# Patient Record
Sex: Female | Born: 1950 | Race: White | Hispanic: No | Marital: Married | Smoking: Never smoker
Health system: Southern US, Community
[De-identification: ages and names within clinical notes are randomized; demographics above are authoritative.]

## PROBLEM LIST (undated history)

## (undated) DIAGNOSIS — C801 Malignant (primary) neoplasm, unspecified: Secondary | ICD-10-CM

## (undated) DIAGNOSIS — L570 Actinic keratosis: Secondary | ICD-10-CM

## (undated) DIAGNOSIS — G709 Myoneural disorder, unspecified: Secondary | ICD-10-CM

## (undated) DIAGNOSIS — K219 Gastro-esophageal reflux disease without esophagitis: Secondary | ICD-10-CM

## (undated) DIAGNOSIS — H269 Unspecified cataract: Secondary | ICD-10-CM

## (undated) DIAGNOSIS — G4730 Sleep apnea, unspecified: Secondary | ICD-10-CM

## (undated) DIAGNOSIS — C439 Malignant melanoma of skin, unspecified: Secondary | ICD-10-CM

## (undated) DIAGNOSIS — M1990 Unspecified osteoarthritis, unspecified site: Secondary | ICD-10-CM

## (undated) DIAGNOSIS — E039 Hypothyroidism, unspecified: Secondary | ICD-10-CM

## (undated) HISTORY — PX: EYE SURGERY: SHX253

## (undated) HISTORY — DX: Actinic keratosis: L57.0

## (undated) HISTORY — DX: Unspecified osteoarthritis, unspecified site: M19.90

## (undated) HISTORY — PX: BREAST CYST ASPIRATION: SHX578

## (undated) HISTORY — PX: MELANOMA EXCISION: SHX5266

## (undated) HISTORY — PX: FRACTURE SURGERY: SHX138

## (undated) HISTORY — PX: FOOT SURGERY: SHX648

## (undated) HISTORY — DX: Myoneural disorder, unspecified: G70.9

## (undated) HISTORY — DX: Malignant melanoma of skin, unspecified: C43.9

## (undated) HISTORY — PX: ABDOMINAL HYSTERECTOMY: SHX81

## (undated) HISTORY — PX: BACK SURGERY: SHX140

## (undated) HISTORY — DX: Unspecified cataract: H26.9

## (undated) HISTORY — PX: SPINE SURGERY: SHX786

## (undated) HISTORY — DX: Sleep apnea, unspecified: G47.30

---

## 2007-03-24 ENCOUNTER — Encounter: Admission: RE | Admit: 2007-03-24 | Discharge: 2007-03-24 | Payer: Self-pay | Admitting: Neurosurgery

## 2007-08-16 ENCOUNTER — Emergency Department (HOSPITAL_COMMUNITY): Admission: EM | Admit: 2007-08-16 | Discharge: 2007-08-17 | Payer: Self-pay | Admitting: Emergency Medicine

## 2014-06-21 DIAGNOSIS — C439 Malignant melanoma of skin, unspecified: Secondary | ICD-10-CM

## 2014-06-21 HISTORY — DX: Malignant melanoma of skin, unspecified: C43.9

## 2015-01-01 DIAGNOSIS — E039 Hypothyroidism, unspecified: Secondary | ICD-10-CM | POA: Insufficient documentation

## 2015-01-01 DIAGNOSIS — E7849 Other hyperlipidemia: Secondary | ICD-10-CM | POA: Insufficient documentation

## 2015-01-01 DIAGNOSIS — M109 Gout, unspecified: Secondary | ICD-10-CM | POA: Insufficient documentation

## 2015-11-05 DIAGNOSIS — F419 Anxiety disorder, unspecified: Secondary | ICD-10-CM | POA: Insufficient documentation

## 2015-11-05 DIAGNOSIS — F329 Major depressive disorder, single episode, unspecified: Secondary | ICD-10-CM | POA: Insufficient documentation

## 2017-07-18 DIAGNOSIS — K219 Gastro-esophageal reflux disease without esophagitis: Secondary | ICD-10-CM | POA: Insufficient documentation

## 2017-07-18 DIAGNOSIS — M19042 Primary osteoarthritis, left hand: Secondary | ICD-10-CM | POA: Insufficient documentation

## 2017-07-18 DIAGNOSIS — F5101 Primary insomnia: Secondary | ICD-10-CM | POA: Insufficient documentation

## 2018-01-18 DIAGNOSIS — G629 Polyneuropathy, unspecified: Secondary | ICD-10-CM | POA: Insufficient documentation

## 2018-08-28 DIAGNOSIS — M5417 Radiculopathy, lumbosacral region: Secondary | ICD-10-CM | POA: Insufficient documentation

## 2018-10-11 DIAGNOSIS — H8110 Benign paroxysmal vertigo, unspecified ear: Secondary | ICD-10-CM | POA: Insufficient documentation

## 2019-03-06 ENCOUNTER — Ambulatory Visit (INDEPENDENT_AMBULATORY_CARE_PROVIDER_SITE_OTHER): Payer: Medicare Other | Admitting: Family Medicine

## 2019-03-06 ENCOUNTER — Other Ambulatory Visit: Payer: Self-pay

## 2019-03-06 VITALS — BP 126/72 | HR 84 | Temp 98.4°F | Resp 16 | Ht 64.5 in | Wt 170.0 lb

## 2019-03-06 DIAGNOSIS — E039 Hypothyroidism, unspecified: Secondary | ICD-10-CM | POA: Diagnosis not present

## 2019-03-06 DIAGNOSIS — I10 Essential (primary) hypertension: Secondary | ICD-10-CM | POA: Diagnosis not present

## 2019-03-06 DIAGNOSIS — C439 Malignant melanoma of skin, unspecified: Secondary | ICD-10-CM

## 2019-03-06 DIAGNOSIS — E7849 Other hyperlipidemia: Secondary | ICD-10-CM | POA: Diagnosis not present

## 2019-03-06 DIAGNOSIS — G5793 Unspecified mononeuropathy of bilateral lower limbs: Secondary | ICD-10-CM

## 2019-03-06 DIAGNOSIS — Z23 Encounter for immunization: Secondary | ICD-10-CM

## 2019-03-06 DIAGNOSIS — M19042 Primary osteoarthritis, left hand: Secondary | ICD-10-CM

## 2019-03-06 NOTE — Progress Notes (Signed)
Patient: [name] Female    DOB: [birth year]   68 y.o.   MRN: [id number] Visit Date: 03/06/2019  Today's Provider: [name], MD   Chief Complaint  Patient presents with  . New Patient (Initial Visit)   Subjective:   HPI Patient comes in today to establish care into the practice. She and her husband just moved back in town from [place]. She has lived in [place] most of her life. Her husband has lymphoma being treated at [facility].   Hypertension, follow-up:  BP Readings from Last 3 Encounters:  03/06/19 126/72   She reports good compliance with treatment. She is not having side effects.  She is exercising. She is adherent to low salt diet.   Outside blood pressures are checked occasionally. She is experiencing none.  Patient denies exertional chest pressure/discomfort, lower extremity edema and palpitations.   Cardiovascular risk factors include dyslipidemia.   Weight trend: stable Wt Readings from Last 3 Encounters:  03/06/19 170 lb (77.1 kg)    Current diet: well balanced   Lipid/Cholesterol, Follow-up:   Risk factors for vascular disease include hypertension  She reports good compliance with treatment. She is not having side effects.   Hypothyroidism, follow up: Patient is currently taking levothyroxine 80mcg daily. She reports good compliance and good symptom control.   No Known Allergies   Current Outpatient Medications:  .  allopurinol (ZYLOPRIM) 300 MG tablet, Take 1 tablet by mouth daily., Disp: , Rfl:  .  levothyroxine (SYNTHROID) 75 MCG tablet, Take 1 tablet by mouth daily., Disp: , Rfl:  .  meclizine (ANTIVERT) 25 MG tablet, Take 1 tablet by mouth 3 (three) times daily as needed., Disp: , Rfl:  .  simvastatin (ZOCOR) 40 MG tablet, Take 1 tablet by mouth daily., Disp: , Rfl:  .  triamterene-hydrochlorothiazide (DYAZIDE) 37.5-25 MG capsule, Take 1 capsule by mouth daily., Disp: , Rfl:  .  zolpidem (AMBIEN) 5 MG tablet,  Take 1 tablet by mouth at bedtime as needed., Disp: , Rfl:  .  Ascorbic Acid (VITAMIN C) 500 MG CHEW, Chew 2 tablets by mouth daily., Disp: , Rfl:  .  aspirin EC 81 MG tablet, Take 1 tablet by mouth daily., Disp: , Rfl:  .  Biotin (SUPER BIOTIN) 5 MG TABS, Take 1 tablet by mouth daily., Disp: , Rfl:  .  Cholecalciferol (VITAMIN D-1000 MAX ST) 25 MCG (1000 UT) tablet, Take 1 tablet by mouth daily., Disp: , Rfl:  .  Naproxen Sodium 220 MG CAPS, Take 1 tablet by mouth at bedtime as needed., Disp: , Rfl:  .  omeprazole (PRILOSEC) 20 MG capsule, Take 1 capsule by mouth daily., Disp: , Rfl:   Review of Systems  Constitutional: Negative.   HENT: Negative.   Eyes: Negative.   Respiratory: Negative.   Cardiovascular: Negative.   Gastrointestinal: Negative.   Endocrine: Negative.   Genitourinary: Negative.   Musculoskeletal: Positive for arthralgias and back pain.  Skin: Negative.   Allergic/Immunologic: Negative.   Neurological: Negative.   Hematological: Negative.   Psychiatric/Behavioral: Negative.     Social History   Tobacco Use  . Smoking status: Not on file  Substance Use Topics  . Alcohol use: Not on file      Objective:   BP 126/72   Pulse 84   Temp 98.4 F (36.9 C)   Resp 16   Ht 5' 4.5" (1.638 m)   Wt 170 lb (77.1 kg)   LMP  (LMP Unknown)  Comment: 1980s  SpO2 96%   BMI 28.73 kg/m  Vitals:   03/06/19 1532  BP: 126/72  Pulse: 84  Resp: 16  Temp: 98.4 F (36.9 C)  SpO2: 96%  Weight: 170 lb (77.1 kg)  Height: 5' 4.5" (1.638 m)  Body mass index is 28.73 kg/m.   Physical Exam HENT:     Head: Normocephalic and atraumatic.     Right Ear: External ear normal.     Left Ear: External ear normal.  Eyes:     General: No scleral icterus.    Conjunctiva/sclera: Conjunctivae normal.  Cardiovascular:     Rate and Rhythm: Normal rate and regular rhythm.     Pulses: Normal pulses.     Heart sounds: Normal heart sounds.  Pulmonary:     Effort: Pulmonary effort is  normal.     Breath sounds: Normal breath sounds.  Abdominal:     Palpations: Abdomen is soft.  Musculoskeletal:        General: Swelling present.     Comments: Swelling of PIPs and DIPs of both hands.  Lymphadenopathy:     Cervical: No cervical adenopathy.  Skin:    General: Skin is warm and dry.  Neurological:     General: No focal deficit present.     Mental Status: She is alert and oriented to person, place, and time.  Psychiatric:        Mood and Affect: Mood normal.        Behavior: Behavior normal.        Thought Content: Thought content normal.        Judgment: Judgment normal.      No results found for any visits on 03/06/19.     Assessment & Plan    1. Need for immunization against influenza  - Flu vaccine HIGH DOSE PF  2. Hypothyroidism, unspecified type  - CBC w/Diff/Platelet - TSH  3. Other hyperlipidemia Zocor,. - Lipid Profile  4. Hypertension, unspecified type Controlled.on Dyazide. - CBC w/Diff/Platelet - Comp. Metabolic Panel (12)  5. Primary osteoarthritis, left hand/right   6. Melanoma of skin ([place])  - Ambulatory referral to Dermatology  7. Neuropathy of both feet/s/p lumbar laminectomy Try gabapentin.at 100mg  q hs.Pt worried about side effects.      Richard Cranford Mon, MD  [facility]

## 2019-03-06 NOTE — Patient Instructions (Signed)
1. Try Gabapentin 100 mg at night 2. Try Tumeric daily for OA of hands

## 2019-03-07 DIAGNOSIS — E039 Hypothyroidism, unspecified: Secondary | ICD-10-CM | POA: Diagnosis not present

## 2019-03-07 DIAGNOSIS — I10 Essential (primary) hypertension: Secondary | ICD-10-CM | POA: Diagnosis not present

## 2019-03-07 DIAGNOSIS — E7849 Other hyperlipidemia: Secondary | ICD-10-CM | POA: Diagnosis not present

## 2019-03-08 LAB — COMP. METABOLIC PANEL (12)
AST: 22 IU/L (ref 0–40)
Albumin/Globulin Ratio: 1.6 (ref 1.2–2.2)
Albumin: 4.2 g/dL (ref 3.8–4.8)
Alkaline Phosphatase: 95 IU/L (ref 39–117)
BUN/Creatinine Ratio: 21 (ref 12–28)
BUN: 22 mg/dL (ref 8–27)
Bilirubin Total: 0.4 mg/dL (ref 0.0–1.2)
Calcium: 9.9 mg/dL (ref 8.7–10.3)
Chloride: 104 mmol/L (ref 96–106)
Creatinine, Ser: 1.04 mg/dL — ABNORMAL HIGH (ref 0.57–1.00)
GFR calc Af Amer: 64 mL/min/{1.73_m2} (ref >59)
GFR calc non Af Amer: 56 mL/min/{1.73_m2} — ABNORMAL LOW (ref >59)
Globulin, Total: 2.6 g/dL (ref 1.5–4.5)
Glucose: 87 mg/dL (ref 65–99)
Potassium: 4.1 mmol/L (ref 3.5–5.2)
Sodium: 144 mmol/L (ref 134–144)
Total Protein: 6.8 g/dL (ref 6.0–8.5)

## 2019-03-08 LAB — CBC WITH DIFFERENTIAL/PLATELET
Basophils Absolute: 0.1 10*3/uL (ref 0.0–0.2)
Basos: 1 %
EOS (ABSOLUTE): 0.2 10*3/uL (ref 0.0–0.4)
Eos: 2 %
Hematocrit: 39.1 % (ref 34.0–46.6)
Hemoglobin: 12.9 g/dL (ref 11.1–15.9)
Immature Grans (Abs): 0.1 10*3/uL (ref 0.0–0.1)
Immature Granulocytes: 2 %
Lymphocytes Absolute: 1.5 10*3/uL (ref 0.7–3.1)
Lymphs: 20 %
MCH: 29.9 pg (ref 26.6–33.0)
MCHC: 33 g/dL (ref 31.5–35.7)
MCV: 91 fL (ref 79–97)
Monocytes Absolute: 0.5 10*3/uL (ref 0.1–0.9)
Monocytes: 7 %
Neutrophils Absolute: 4.8 10*3/uL (ref 1.4–7.0)
Neutrophils: 68 %
Platelets: 214 10*3/uL (ref 150–450)
RBC: 4.31 x10E6/uL (ref 3.77–5.28)
RDW: 13 % (ref 11.7–15.4)
WBC: 7.2 10*3/uL (ref 3.4–10.8)

## 2019-03-08 LAB — LIPID PANEL
Chol/HDL Ratio: 4.1 ratio (ref 0.0–4.4)
Cholesterol, Total: 188 mg/dL (ref 100–199)
HDL: 46 mg/dL (ref >39)
LDL Chol Calc (NIH): 102 mg/dL — ABNORMAL HIGH (ref 0–99)
Triglycerides: 235 mg/dL — ABNORMAL HIGH (ref 0–149)
VLDL Cholesterol Cal: 40 mg/dL (ref 5–40)

## 2019-03-08 LAB — TSH: TSH: 3.04 u[IU]/mL (ref 0.450–4.500)

## 2019-04-30 ENCOUNTER — Encounter: Payer: Self-pay | Admitting: Family Medicine

## 2019-04-30 DIAGNOSIS — E7849 Other hyperlipidemia: Secondary | ICD-10-CM

## 2019-04-30 DIAGNOSIS — Z1231 Encounter for screening mammogram for malignant neoplasm of breast: Secondary | ICD-10-CM

## 2019-04-30 MED ORDER — SIMVASTATIN 40 MG PO TABS: 40.0000 mg | ORAL_TABLET | Freq: Every day | ORAL | 3 refills | Status: DC

## 2019-05-10 DIAGNOSIS — Z85820 Personal history of malignant melanoma of skin: Secondary | ICD-10-CM | POA: Diagnosis not present

## 2019-05-10 DIAGNOSIS — D485 Neoplasm of uncertain behavior of skin: Secondary | ICD-10-CM | POA: Diagnosis not present

## 2019-05-10 DIAGNOSIS — Z1283 Encounter for screening for malignant neoplasm of skin: Secondary | ICD-10-CM | POA: Diagnosis not present

## 2019-05-10 DIAGNOSIS — D2362 Other benign neoplasm of skin of left upper limb, including shoulder: Secondary | ICD-10-CM | POA: Diagnosis not present

## 2019-05-10 DIAGNOSIS — C44619 Basal cell carcinoma of skin of left upper limb, including shoulder: Secondary | ICD-10-CM | POA: Diagnosis not present

## 2019-05-31 ENCOUNTER — Encounter: Payer: Self-pay | Admitting: Family Medicine

## 2019-05-31 ENCOUNTER — Other Ambulatory Visit: Payer: Self-pay | Admitting: Family Medicine

## 2019-05-31 DIAGNOSIS — Z1211 Encounter for screening for malignant neoplasm of colon: Secondary | ICD-10-CM

## 2019-05-31 DIAGNOSIS — L570 Actinic keratosis: Secondary | ICD-10-CM | POA: Diagnosis not present

## 2019-05-31 DIAGNOSIS — Z0100 Encounter for examination of eyes and vision without abnormal findings: Secondary | ICD-10-CM

## 2019-05-31 DIAGNOSIS — Z872 Personal history of diseases of the skin and subcutaneous tissue: Secondary | ICD-10-CM | POA: Diagnosis not present

## 2019-05-31 DIAGNOSIS — C44619 Basal cell carcinoma of skin of left upper limb, including shoulder: Secondary | ICD-10-CM | POA: Diagnosis not present

## 2019-05-31 DIAGNOSIS — C4491 Basal cell carcinoma of skin, unspecified: Secondary | ICD-10-CM

## 2019-05-31 DIAGNOSIS — R238 Other skin changes: Secondary | ICD-10-CM | POA: Diagnosis not present

## 2019-05-31 DIAGNOSIS — Z1231 Encounter for screening mammogram for malignant neoplasm of breast: Secondary | ICD-10-CM

## 2019-05-31 HISTORY — DX: Basal cell carcinoma of skin, unspecified: C44.91

## 2019-06-04 ENCOUNTER — Other Ambulatory Visit: Payer: Self-pay

## 2019-06-04 ENCOUNTER — Telehealth: Payer: Self-pay

## 2019-06-04 DIAGNOSIS — Z1211 Encounter for screening for malignant neoplasm of colon: Secondary | ICD-10-CM

## 2019-06-04 MED ORDER — NA SULFATE-K SULFATE-MG SULF 17.5-3.13-1.6 GM/177ML PO SOLN: 1.0000 | Freq: Once | ORAL | 0 refills | Status: AC

## 2019-06-04 NOTE — Telephone Encounter (Signed)
Gastroenterology Pre-Procedure Review  Request Date: Tues 06/12/19 Requesting Physician: Dr. Allen Norris  PATIENT REVIEW QUESTIONS: The patient responded to the following health history questions as indicated:    1. Are you having any GI issues? no 2. Do you have a personal history of Polyps? no 3. Do you have a family history of Colon Cancer or Polyps? no 4. Diabetes Mellitus? no 5. Joint replacements in the past 12 months?no 6. Major health problems in the past 3 months?no 7. Any artificial heart valves, MVP, or defibrillator?no    MEDICATIONS & ALLERGIES:    Patient reports the following regarding taking any anticoagulation/antiplatelet therapy:   Plavix, Coumadin, Eliquis, Xarelto, Lovenox, Pradaxa, Brilinta, or Effient? no Aspirin? yes (81 mg daily)  Patient confirms/reports the following medications:  Current Outpatient Medications  Medication Sig Dispense Refill  . allopurinol (ZYLOPRIM) 300 MG tablet Take 1 tablet by mouth daily.    . Ascorbic Acid (VITAMIN C) 500 MG CHEW Chew 2 tablets by mouth daily.    Marland Kitchen aspirin EC 81 MG tablet Take 1 tablet by mouth daily.    . Biotin (SUPER BIOTIN) 5 MG TABS Take 1 tablet by mouth daily.    . Cholecalciferol (VITAMIN D-1000 MAX ST) 25 MCG (1000 UT) tablet Take 1 tablet by mouth daily.    Marland Kitchen levothyroxine (SYNTHROID) 75 MCG tablet Take 1 tablet by mouth daily.    . meclizine (ANTIVERT) 25 MG tablet Take 1 tablet by mouth 3 (three) times daily as needed.    . Na Sulfate-K Sulfate-Mg Sulf 17.5-3.13-1.6 GM/177ML SOLN Take 1 kit by mouth once for 1 dose. 354 mL 0  . Naproxen Sodium 220 MG CAPS Take 1 tablet by mouth at bedtime as needed.    Marland Kitchen omeprazole (PRILOSEC) 20 MG capsule Take 1 capsule by mouth daily.    . simvastatin (ZOCOR) 40 MG tablet Take 1 tablet (40 mg total) by mouth daily. 90 tablet 3  . triamterene-hydrochlorothiazide (DYAZIDE) 37.5-25 MG capsule Take 1 capsule by mouth daily.    Marland Kitchen zolpidem (AMBIEN) 5 MG tablet Take 1 tablet by  mouth at bedtime as needed.     No current facility-administered medications for this visit.    Patient confirms/reports the following allergies:  No Known Allergies  No orders of the defined types were placed in this encounter.   AUTHORIZATION INFORMATION Primary Insurance: 1D#: Group #:  Secondary Insurance: 1D#: Group #:  SCHEDULE INFORMATION: Date: 06/12/19 Time: Location:[facility]

## 2019-06-05 ENCOUNTER — Telehealth: Payer: Self-pay

## 2019-06-05 ENCOUNTER — Other Ambulatory Visit: Payer: Self-pay

## 2019-06-05 NOTE — Telephone Encounter (Signed)
Suprep bowel prep has been changed to Golytely bowel prep.  Instructions begin at 5pm the evening before procedure drink 8 oz every 30 minutes until completed the entire contents. Pharmacy will notify patient when her rx is ready.  Thanks [employer]

## 2019-06-07 ENCOUNTER — Ambulatory Visit
Admission: RE | Admit: 2019-06-07 | Discharge: 2019-06-07 | Disposition: A | Discharge status: Home / Self Care | Payer: Medicare Other | Source: Ambulatory Visit | Attending: Family Medicine | Admitting: Family Medicine

## 2019-06-07 DIAGNOSIS — Z1231 Encounter for screening mammogram for malignant neoplasm of breast: Secondary | ICD-10-CM | POA: Diagnosis not present

## 2019-06-07 HISTORY — DX: Malignant (primary) neoplasm, unspecified: C80.1

## 2019-06-08 ENCOUNTER — Other Ambulatory Visit: Payer: Self-pay

## 2019-06-08 ENCOUNTER — Other Ambulatory Visit
Admission: RE | Admit: 2019-06-08 | Discharge: 2019-06-08 | Disposition: A | Discharge status: Home / Self Care | Payer: Medicare Other | Source: Ambulatory Visit | Attending: Gastroenterology | Admitting: Gastroenterology

## 2019-06-08 DIAGNOSIS — Z20828 Contact with and (suspected) exposure to other viral communicable diseases: Secondary | ICD-10-CM | POA: Insufficient documentation

## 2019-06-08 DIAGNOSIS — Z01812 Encounter for preprocedural laboratory examination: Secondary | ICD-10-CM | POA: Diagnosis not present

## 2019-06-08 LAB — SARS CORONAVIRUS 2 (TAT 6-24 HRS): SARS Coronavirus 2: NEGATIVE

## 2019-06-11 ENCOUNTER — Encounter: Payer: Self-pay | Admitting: Gastroenterology

## 2019-06-12 ENCOUNTER — Other Ambulatory Visit: Payer: Self-pay

## 2019-06-12 ENCOUNTER — Ambulatory Visit: Payer: Medicare Other | Admitting: Anesthesiology

## 2019-06-12 ENCOUNTER — Encounter
Admission: RE | Disposition: A | Discharge status: Home / Self Care | Payer: Self-pay | Source: Home / Self Care | Attending: Gastroenterology

## 2019-06-12 ENCOUNTER — Encounter: Payer: Self-pay | Admitting: Gastroenterology

## 2019-06-12 ENCOUNTER — Ambulatory Visit
Admission: RE | Admit: 2019-06-12 | Discharge: 2019-06-12 | Disposition: A | Discharge status: Home / Self Care | Payer: Medicare Other | Attending: Gastroenterology | Admitting: Gastroenterology

## 2019-06-12 DIAGNOSIS — Z1211 Encounter for screening for malignant neoplasm of colon: Secondary | ICD-10-CM | POA: Diagnosis not present

## 2019-06-12 DIAGNOSIS — Z7982 Long term (current) use of aspirin: Secondary | ICD-10-CM | POA: Insufficient documentation

## 2019-06-12 DIAGNOSIS — F419 Anxiety disorder, unspecified: Secondary | ICD-10-CM | POA: Diagnosis not present

## 2019-06-12 DIAGNOSIS — D123 Benign neoplasm of transverse colon: Secondary | ICD-10-CM | POA: Diagnosis not present

## 2019-06-12 DIAGNOSIS — F329 Major depressive disorder, single episode, unspecified: Secondary | ICD-10-CM | POA: Diagnosis not present

## 2019-06-12 DIAGNOSIS — K219 Gastro-esophageal reflux disease without esophagitis: Secondary | ICD-10-CM | POA: Diagnosis not present

## 2019-06-12 DIAGNOSIS — K5730 Diverticulosis of large intestine without perforation or abscess without bleeding: Secondary | ICD-10-CM | POA: Insufficient documentation

## 2019-06-12 DIAGNOSIS — Z85820 Personal history of malignant melanoma of skin: Secondary | ICD-10-CM | POA: Diagnosis not present

## 2019-06-12 DIAGNOSIS — M5417 Radiculopathy, lumbosacral region: Secondary | ICD-10-CM | POA: Diagnosis not present

## 2019-06-12 DIAGNOSIS — Z803 Family history of malignant neoplasm of breast: Secondary | ICD-10-CM | POA: Diagnosis not present

## 2019-06-12 DIAGNOSIS — E039 Hypothyroidism, unspecified: Secondary | ICD-10-CM | POA: Insufficient documentation

## 2019-06-12 DIAGNOSIS — K635 Polyp of colon: Secondary | ICD-10-CM

## 2019-06-12 DIAGNOSIS — Z90710 Acquired absence of both cervix and uterus: Secondary | ICD-10-CM | POA: Insufficient documentation

## 2019-06-12 DIAGNOSIS — Z79899 Other long term (current) drug therapy: Secondary | ICD-10-CM | POA: Insufficient documentation

## 2019-06-12 HISTORY — PX: COLONOSCOPY WITH PROPOFOL: SHX5780

## 2019-06-12 HISTORY — DX: Hypothyroidism, unspecified: E03.9

## 2019-06-12 HISTORY — DX: Gastro-esophageal reflux disease without esophagitis: K21.9

## 2019-06-12 SURGERY — COLONOSCOPY WITH PROPOFOL
Anesthesia: General

## 2019-06-12 MED ORDER — PROPOFOL 500 MG/50ML IV EMUL
INTRAVENOUS | Status: AC
  Filled 2019-06-12: qty 200

## 2019-06-12 MED ORDER — SUGAMMADEX SODIUM 200 MG/2ML IV SOLN
INTRAVENOUS | Status: AC
  Filled 2019-06-12: qty 2

## 2019-06-12 MED ORDER — PROPOFOL 500 MG/50ML IV EMUL
INTRAVENOUS | Status: DC | PRN
  Administered 2019-06-12: 140 ug/kg/min via INTRAVENOUS

## 2019-06-12 MED ORDER — SODIUM CHLORIDE 0.9 % IV SOLN
INTRAVENOUS | Status: DC
  Administered 2019-06-12: 08:00:00 1000 mL via INTRAVENOUS

## 2019-06-12 MED ORDER — PROPOFOL 10 MG/ML IV BOLUS
INTRAVENOUS | Status: DC | PRN
  Administered 2019-06-12 (×2): 20 mg via INTRAVENOUS
  Administered 2019-06-12: 80 mg via INTRAVENOUS

## 2019-06-12 NOTE — Anesthesia Preprocedure Evaluation (Addendum)
Anesthesia Evaluation  Patient identified by MRN, date of birth, ID band Patient awake    Reviewed: Allergy & Precautions, H&P , NPO status , Patient's Chart, lab work & pertinent test results  Airway Mallampati: II  TM Distance: >3 FB     Dental   Pulmonary neg pulmonary ROS,           Cardiovascular negative cardio ROS       Neuro/Psych PSYCHIATRIC DISORDERS Anxiety Depression Polyneuropathy Lumbosacral radiculopathy    GI/Hepatic Neg liver ROS, GERD  Controlled,  Endo/Other  Hypothyroidism   Renal/GU negative Renal ROS  negative genitourinary   Musculoskeletal   Abdominal   Peds  Hematology negative hematology ROS (+)   Anesthesia Other Findings Past Medical History: No date: Cancer ([place])     Comment:  Meanoma,basal cell  Past Surgical History: No date: BREAST CYST ASPIRATION; Left     Comment:  Neg     Reproductive/Obstetrics negative OB ROS                            Anesthesia Physical Anesthesia Plan  ASA: II  Anesthesia Plan: General   Post-op Pain Management:    Induction:   PONV Risk Score and Plan: Propofol infusion and TIVA  Airway Management Planned: Nasal Cannula  Additional Equipment:   Intra-op Plan:   Post-operative Plan:   Informed Consent: I have reviewed the patients History and Physical, chart, labs and discussed the procedure including the risks, benefits and alternatives for the proposed anesthesia with the patient or authorized representative who has indicated his/her understanding and acceptance.     Dental Advisory Given  Plan Discussed with: Anesthesiologist  Anesthesia Plan Comments:         Anesthesia Quick Evaluation

## 2019-06-12 NOTE — Anesthesia Post-op Follow-up Note (Signed)
Anesthesia QCDR form completed.        

## 2019-06-12 NOTE — H&P (Signed)
Lucilla Lame, MD [address] [place], [place] [address] Phone: [phone] Fax : [phone]  Primary Care Physician:  Jerrol Banana., MD Primary Gastroenterologist:  Dr. Allen Norris  Pre-Procedure History & Physical: HPI:  [name] is a 68 y.o. female is here for a screening colonoscopy.   Past Medical History:  Diagnosis Date  . Cancer ([place])    Meanoma,basal cell  . GERD (gastroesophageal reflux disease)   . Hypothyroidism     Past Surgical History:  Procedure Laterality Date  . ABDOMINAL HYSTERECTOMY    . BACK SURGERY    . BREAST CYST ASPIRATION Left    Neg  . EYE SURGERY     cataracts  . FOOT SURGERY    . MELANOMA EXCISION      Prior to Admission medications   Medication Sig Start Date End Date Taking? Authorizing Provider  allopurinol (ZYLOPRIM) 300 MG tablet Take 1 tablet by mouth daily. 08/02/18   [provider]  Ascorbic Acid (VITAMIN C) 500 MG CHEW Chew 2 tablets by mouth daily.    [provider]  aspirin EC 81 MG tablet Take 1 tablet by mouth daily.    [provider]  Biotin (SUPER BIOTIN) 5 MG TABS Take 1 tablet by mouth daily.    [provider]  Cholecalciferol (VITAMIN D-1000 MAX ST) 25 MCG (1000 UT) tablet Take 1 tablet by mouth daily.    [provider]  levothyroxine (SYNTHROID) 75 MCG tablet Take 1 tablet by mouth daily. 11/15/18   [provider]  meclizine (ANTIVERT) 25 MG tablet Take 1 tablet by mouth 3 (three) times daily as needed. 10/11/18 10/12/19  [provider]  Naproxen Sodium 220 MG CAPS Take 1 tablet by mouth at bedtime as needed.    [provider]  omeprazole (PRILOSEC) 20 MG capsule Take 1 capsule by mouth daily.    [provider]  simvastatin (ZOCOR) 40 MG tablet Take 1 tablet (40 mg total) by mouth daily. 04/30/19 10/28/19  Jerrol Banana., MD  triamterene-hydrochlorothiazide (DYAZIDE) 37.5-25 MG capsule Take 1 capsule  by mouth daily. 10/02/18   [provider]  zolpidem (AMBIEN) 5 MG tablet Take 1 tablet by mouth at bedtime as needed. 07/15/16   [provider]    Allergies as of 06/04/2019  . (No Known Allergies)    Family History  Problem Relation Age of Onset  . Breast cancer Mother 75    Social History   Socioeconomic History  . Marital status: Married    Spouse name: Not on file  . Number of children: Not on file  . Years of education: Not on file  . Highest education level: Not on file  Occupational History  . Not on file  Tobacco Use  . Smoking status: Never Smoker  . Smokeless tobacco: Never Used  Substance and Sexual Activity  . Alcohol use: Not on file  . Drug use: Not on file  . Sexual activity: Not on file  Other Topics Concern  . Not on file  Social History Narrative  . Not on file   Social Determinants of Health   Financial Resource Strain:   . Difficulty of Paying Living Expenses: Not on file  Food Insecurity:   . Worried About Charity fundraiser in the Last Year: Not on file  . Ran Out of Food in the Last Year: Not on file  Transportation Needs:   . Lack of Transportation (Medical): Not  on file  . Lack of Transportation (Non-Medical): Not on file  Physical Activity:   . Days of Exercise per Week: Not on file  . Minutes of Exercise per Session: Not on file  Stress:   . Feeling of Stress : Not on file  Social Connections:   . Frequency of Communication with Friends and Family: Not on file  . Frequency of Social Gatherings with Friends and Family: Not on file  . Attends Religious Services: Not on file  . Active Member of Clubs or Organizations: Not on file  . Attends Archivist Meetings: Not on file  . Marital Status: Not on file  Intimate Partner Violence:   . Fear of Current or Ex-Partner: Not on file  . Emotionally Abused: Not on file  . Physically Abused: Not on file  . Sexually Abused: Not on file    Review of  Systems: See HPI, otherwise negative ROS  Physical Exam: BP 135/90   Pulse 76   Temp (!) 97.2 F (36.2 C) (Skin)   Resp 16   Ht 5\' 4"  (1.626 m)   Wt 77.3 kg   LMP  (LMP Unknown) Comment: 1980s  SpO2 97%   BMI 29.24 kg/m  General:   Alert,  pleasant and cooperative in NAD Head:  Normocephalic and atraumatic. Neck:  Supple; no masses or thyromegaly. Lungs:  Clear throughout to auscultation.    Heart:  Regular rate and rhythm. Abdomen:  Soft, nontender and nondistended. Normal bowel sounds, without guarding, and without rebound.   Neurologic:  Alert and  oriented x4;  grossly normal neurologically.  Impression/Plan: [name] is now here to undergo a screening colonoscopy.  Risks, benefits, and alternatives regarding colonoscopy have been reviewed with the patient.  Questions have been answered.  All parties agreeable.

## 2019-06-12 NOTE — Anesthesia Postprocedure Evaluation (Signed)
Anesthesia Post Note  Patient: [name]  Procedure(s) Performed: COLONOSCOPY WITH PROPOFOL (N/A )  Patient location during evaluation: PACU Anesthesia Type: General Level of consciousness: awake and alert Pain management: pain level controlled Vital Signs Assessment: post-procedure vital signs reviewed and stable Respiratory status: spontaneous breathing, nonlabored ventilation and respiratory function stable Cardiovascular status: blood pressure returned to baseline and stable Postop Assessment: no apparent nausea or vomiting Anesthetic complications: no     Last Vitals:  Vitals:   06/12/19 0831 06/12/19 0841  BP: 98/63 (!) 111/94  Pulse: 86   Resp: 11   Temp: (!) 36.1 C   SpO2: 96%     Last Pain:  Vitals:   06/12/19 0901  TempSrc:   PainSc: 0-No pain                 Tera Mater

## 2019-06-12 NOTE — Transfer of Care (Signed)
Immediate Anesthesia Transfer of Care Note  Patient: [name]  Procedure(s) Performed: COLONOSCOPY WITH PROPOFOL (N/A )  Patient Location: PACU  Anesthesia Type:General  Level of Consciousness: sedated  Airway & Oxygen Therapy: Patient Spontanous Breathing  Post-op Assessment: Report given to RN and Post -op Vital signs reviewed and stable  Post vital signs: Reviewed and stable  Last Vitals:  Vitals Value Taken Time  BP 98/63 06/12/19 0832  Temp 36.1 C 06/12/19 0831  Pulse 85 06/12/19 0833  Resp 12 06/12/19 0833  SpO2 94 % 06/12/19 0833  Vitals shown include unvalidated device data.  Last Pain:  Vitals:   06/12/19 0831  TempSrc: Temporal  PainSc: Asleep         Complications: No apparent anesthesia complications

## 2019-06-12 NOTE — Op Note (Signed)
[facility] Gastroenterology Patient Name: [name] Procedure Date: 06/12/2019 7:59 AM MRN: [id number] Account #: [id number] Date of Birth: [birth year] Admit Type: Outpatient Age: 68 Room: [facility] ENDO ROOM 4 Gender: Female Note Status: Finalized Procedure:             Colonoscopy Indications:           Screening for colorectal malignant neoplasm Providers:             Lucilla Lame MD, MD Referring MD:          Janine Ores. Rosanna Randy, MD (Referring MD) Medicines:             Propofol per Anesthesia Complications:         No immediate complications. Procedure:             Pre-Anesthesia Assessment:                        - Prior to the procedure, a History and Physical was                         performed, and patient medications and allergies were                         reviewed. The patient's tolerance of previous                         anesthesia was also reviewed. The risks and benefits                         of the procedure and the sedation options and risks                         were discussed with the patient. All questions were                         answered, and informed consent was obtained. Prior                         Anticoagulants: The patient has taken no previous                         anticoagulant or antiplatelet agents. ASA Grade                         Assessment: II - A patient with mild systemic disease.                         After reviewing the risks and benefits, the patient                         was deemed in satisfactory condition to undergo the                         procedure.                        After obtaining informed consent, the colonoscope was  passed under direct vision. Throughout the procedure,                         the patient's blood pressure, pulse, and oxygen                         saturations were monitored continuously. The                         Colonoscope was introduced  through the anus and                         advanced to the the cecum, identified by appendiceal                         orifice and ileocecal valve. The colonoscopy was                         performed without difficulty. The patient tolerated                         the procedure well. The quality of the bowel                         preparation was excellent. Findings:      A 3 mm polyp was found in the transverse colon. The polyp was sessile.       The polyp was removed with a cold biopsy forceps. Resection and       retrieval were complete.      A few small-mouthed diverticula were found in the entire colon. Impression:            - One 3 mm polyp in the transverse colon, removed with                         a cold biopsy forceps. Resected and retrieved.                        - Diverticulosis in the entire examined colon. Recommendation:        - Discharge patient to home.                        - Resume previous diet.                        - Continue present medications.                        - Await pathology results.                        - Repeat colonoscopy in 5 years if polyp adenoma and                         10 years if hyperplastic Procedure Code(s):     --- Professional ---                        [phone], Colonoscopy, flexible; with biopsy, single or  multiple Diagnosis Code(s):     --- Professional ---                        Z12.11, Encounter for screening for malignant neoplasm                         of colon                        K63.5, Polyp of colon CPT copyright 2019 American Medical Association. All rights reserved. The codes documented in this report are preliminary and upon coder review may  be revised to meet current compliance requirements. Lucilla Lame MD, MD 06/12/2019 8:31:13 AM This report has been signed electronically. Number of Addenda: 0 Note Initiated On: 06/12/2019 7:59 AM Scope Withdrawal Time: 0 hours 8 minutes 40  seconds  Total Procedure Duration: 0 hours 15 minutes 58 seconds  Estimated Blood Loss:  Estimated blood loss: none.      [facility]

## 2019-06-13 ENCOUNTER — Encounter: Payer: Self-pay | Admitting: *Deleted

## 2019-06-14 ENCOUNTER — Encounter: Payer: Self-pay | Admitting: Gastroenterology

## 2019-06-14 LAB — SURGICAL PATHOLOGY

## 2019-06-21 ENCOUNTER — Other Ambulatory Visit: Payer: Self-pay | Admitting: Family Medicine

## 2019-06-21 DIAGNOSIS — N6310 Unspecified lump in the right breast, unspecified quadrant: Secondary | ICD-10-CM

## 2019-06-21 DIAGNOSIS — R928 Other abnormal and inconclusive findings on diagnostic imaging of breast: Secondary | ICD-10-CM

## 2019-07-02 ENCOUNTER — Ambulatory Visit
Admission: RE | Admit: 2019-07-02 | Discharge: 2019-07-02 | Disposition: A | Discharge status: Home / Self Care | Payer: Medicare Other | Source: Ambulatory Visit | Attending: Family Medicine | Admitting: Family Medicine

## 2019-07-02 DIAGNOSIS — R928 Other abnormal and inconclusive findings on diagnostic imaging of breast: Secondary | ICD-10-CM

## 2019-07-02 DIAGNOSIS — R922 Inconclusive mammogram: Secondary | ICD-10-CM | POA: Diagnosis not present

## 2019-07-02 DIAGNOSIS — N6310 Unspecified lump in the right breast, unspecified quadrant: Secondary | ICD-10-CM | POA: Insufficient documentation

## 2019-07-02 DIAGNOSIS — N6489 Other specified disorders of breast: Secondary | ICD-10-CM | POA: Diagnosis not present

## 2019-07-06 ENCOUNTER — Encounter: Payer: Self-pay | Admitting: Family Medicine

## 2019-07-11 ENCOUNTER — Telehealth: Payer: Self-pay | Admitting: Family Medicine

## 2019-07-11 DIAGNOSIS — G629 Polyneuropathy, unspecified: Secondary | ICD-10-CM

## 2019-07-11 NOTE — Telephone Encounter (Signed)
Please advise? There is also an e-mail message in chart with more information from patient concerning message below.

## 2019-07-11 NOTE — Telephone Encounter (Signed)
Patient is requesting PCP to call her in new prescription for medication gabapentin 100mg .  Patient states PCP did not prescribe this to her, but she did just  Move  Her from [place].  Patient states she sent PCP a mychart message with more info, Call back [phone]

## 2019-07-13 MED ORDER — GABAPENTIN 100 MG PO CAPS: 100.0000 mg | ORAL_CAPSULE | Freq: Every day | ORAL | 1 refills | Status: DC

## 2019-07-13 NOTE — Telephone Encounter (Signed)
Pt called again to follow up on request from 07/06/19 for gabapentin. Advised that Dr. Rosanna Randy has been OOO. She would like to have rx before the weekend to help her sleep. Please see MyChart message from 07/06/19 for further details.

## 2019-07-13 NOTE — Telephone Encounter (Signed)
Sent in Gabapentin for Dr. Rosanna Randy

## 2019-07-13 NOTE — Telephone Encounter (Signed)
Please review for Dr. Gilbert  

## 2019-08-08 ENCOUNTER — Telehealth: Payer: Self-pay

## 2019-08-08 ENCOUNTER — Other Ambulatory Visit: Payer: Self-pay | Admitting: Family Medicine

## 2019-08-08 ENCOUNTER — Encounter: Payer: Self-pay | Admitting: Family Medicine

## 2019-08-08 MED ORDER — ALLOPURINOL 300 MG PO TABS: 300.0000 mg | ORAL_TABLET | Freq: Every day | ORAL | 3 refills | Status: DC

## 2019-08-08 NOTE — Telephone Encounter (Addendum)
Please advise refill? Rx has never been filled by provider before.

## 2019-08-08 NOTE — Telephone Encounter (Signed)
FYI. KW 

## 2019-08-08 NOTE — Telephone Encounter (Signed)
Medication Refill - Medication: allopurinol (ZYLOPRIM) 300 MG tablet   Has the patient contacted their pharmacy? No. (Agent: If no, request that the patient contact the pharmacy for the refill.) (Agent: If yes, when and what did the pharmacy advise?)  Preferred Pharmacy (with phone number or street name):  [id number] - [place], [place] Phone:  [phone]  Fax:  [phone]       Agent: Please be advised that RX refills may take up to 3 business days. We ask that you follow-up with your pharmacy.

## 2019-08-08 NOTE — Addendum Note (Signed)
Addended by: Julieta Bellini on: 08/08/2019 03:44 PM   Modules accepted: Orders

## 2019-08-08 NOTE — Telephone Encounter (Signed)
Copied from [place] [phone]. Topic: General - Other >> Aug 08, 2019 12:07 PM Alanda Slim E wrote: Reason for CRM: Pt just wanted to let Dr. Rosanna Randy know that she has received both rounds of the covid vaccine in [place]

## 2019-08-12 ENCOUNTER — Encounter: Payer: Self-pay | Admitting: Family Medicine

## 2019-08-16 ENCOUNTER — Other Ambulatory Visit: Payer: Self-pay | Admitting: Family Medicine

## 2019-08-16 MED ORDER — ZOLPIDEM TARTRATE 5 MG PO TABS: 5.0000 mg | ORAL_TABLET | Freq: Every evening | ORAL | 2 refills | Status: DC | PRN

## 2019-08-16 NOTE — Telephone Encounter (Signed)
Pt called again to follow up on her request for rx for zolpidem (AMBIEN) 5 MG tablet  Stated she sent a MyChart message on 08/12/19 and has not heard anything. She is seeing Dr. Alcide Clever on 09/04/19 and will discuss with him then further but would like to have a rx filled until then. Stated it helps her sleep and she has been taking since 2018. Please advise.  [id number] - [place], [place] - [place] Phone:  [phone]  Fax:  [phone]

## 2019-08-21 DIAGNOSIS — D3131 Benign neoplasm of right choroid: Secondary | ICD-10-CM | POA: Diagnosis not present

## 2019-09-04 ENCOUNTER — Encounter: Payer: Self-pay | Admitting: Family Medicine

## 2019-09-06 ENCOUNTER — Ambulatory Visit: Payer: Medicare Other | Admitting: Dermatology

## 2019-09-17 ENCOUNTER — Telehealth: Payer: Self-pay

## 2019-09-17 NOTE — Telephone Encounter (Signed)
Copied from [place] [id number]. Topic: General - Inquiry >> Sep 17, 2019  2:04 PM Alease Frame wrote: Reason for CRM: Patient has an upcoming appt and wanted to know if she had get any blood work done before appt . Please reach out to pt

## 2019-09-18 NOTE — Telephone Encounter (Signed)
Patient advised.

## 2019-09-18 NOTE — Telephone Encounter (Signed)
Not before

## 2019-09-21 NOTE — Progress Notes (Signed)
Patient: [name], Female    DOB: [birth year], 69 y.o.   MRN: [id number] Visit Date: 10/01/2019  Today's Provider: [name], MD   Chief Complaint  Patient presents with  . Annual Exam   Subjective:     Annual wellness visit [name] is a 69 y.o. female. She feels fairly well. She reports exercising yes. She reports she is sleeping fairly well. She is also here for annual physical. She has had both Covid vaccines.  Biggest issue in her life is husband has had recurrent vomiting can be admitted tomorrow for aggressive treatment.  She is very worried he will not survive this. -----------------------------------------------------------   Review of Systems  Constitutional: Positive for unexpected weight change.       Weight gain during Covid.  Eyes: Positive for photophobia.  Cardiovascular: Positive for leg swelling.  Endocrine: Negative.   Musculoskeletal: Positive for joint swelling.  Allergic/Immunologic: Negative.   Neurological: Positive for numbness.       She has worsening neuropathy in her feet at night.  Hematological: Negative.   Psychiatric/Behavioral: Negative.   All other systems reviewed and are negative.   Social History   Socioeconomic History  . Marital status: Married    Spouse name: Not on file  . Number of children: Not on file  . Years of education: Not on file  . Highest education level: Not on file  Occupational History  . Not on file  Tobacco Use  . Smoking status: Never Smoker  . Smokeless tobacco: Never Used  Substance and Sexual Activity  . Alcohol use: Not on file  . Drug use: Not on file  . Sexual activity: Not on file  Other Topics Concern  . Not on file  Social History Narrative  . Not on file   Social Determinants of Health   Financial Resource Strain:   . Difficulty of Paying Living Expenses:   Food Insecurity:   . Worried About Charity fundraiser in the Last Year:   . Academic librarian in the Last Year:   Transportation Needs:   . Film/video editor (Medical):   Marland Kitchen Lack of Transportation (Non-Medical):   Physical Activity:   . Days of Exercise per Week:   . Minutes of Exercise per Session:   Stress:   . Feeling of Stress :   Social Connections:   . Frequency of Communication with Friends and Family:   . Frequency of Social Gatherings with Friends and Family:   . Attends Religious Services:   . Active Member of Clubs or Organizations:   . Attends Archivist Meetings:   Marland Kitchen Marital Status:   Intimate Partner Violence:   . Fear of Current or Ex-Partner:   . Emotionally Abused:   Marland Kitchen Physically Abused:   . Sexually Abused:     Past Medical History:  Diagnosis Date  . Actinic keratosis   . Basal cell carcinoma 05/31/2019   left dorsal forearm  . Cancer ([place])    Meanoma,basal cell  . GERD (gastroesophageal reflux disease)   . Hypothyroidism   . Melanoma [facility])    chest     Patient Active Problem List   Diagnosis Date Noted  . Encounter for screening colonoscopy   . Polyp of transverse colon   . BPPV (benign paroxysmal positional vertigo) 10/11/2018  . Lumbosacral radiculopathy at S1 08/28/2018  . Polyneuropathy 01/18/2018  . Gastroesophageal reflux disease without esophagitis 07/18/2017  . Primary  insomnia 07/18/2017  . 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 Anxiety disorder, unspecified 11/05/2015  . Major depressive disorder, single episode, unspecified 11/05/2015  . Gout, unspecified 01/01/2015  . Hypothyroidism, unspecified 01/01/2015  . Other hyperlipidemia 01/01/2015    Past Surgical History:  Procedure Laterality Date  . ABDOMINAL HYSTERECTOMY    . BACK SURGERY    . BREAST CYST ASPIRATION Left    Neg  . COLONOSCOPY WITH PROPOFOL N/A 06/12/2019   Procedure: COLONOSCOPY WITH PROPOFOL;  Surgeon: Lucilla Lame, MD;  Location: [facility] ENDOSCOPY;  Service: Endoscopy;  Laterality: N/A;  . EYE SURGERY     cataracts  . FOOT  SURGERY    . MELANOMA EXCISION      Her family history includes Breast cancer (age of onset: 8) in her mother.   Current Outpatient Medications:  .  allopurinol (ZYLOPRIM) 300 MG tablet, Take 1 tablet (300 mg total) by mouth daily., Disp: 90 tablet, Rfl: 3 .  Ascorbic Acid (VITAMIN C) 500 MG CHEW, Chew 2 tablets by mouth daily., Disp: , Rfl:  .  aspirin EC 81 MG tablet, Take 1 tablet by mouth daily., Disp: , Rfl:  .  Biotin (SUPER BIOTIN) 5 MG TABS, Take 1 tablet by mouth daily., Disp: , Rfl:  .  Cholecalciferol (VITAMIN D-1000 MAX ST) 25 MCG (1000 UT) tablet, Take 1 tablet by mouth daily., Disp: , Rfl:  .  gabapentin (NEURONTIN) 100 MG capsule, Take 1 capsule (100 mg total) by mouth at bedtime., Disp: 90 capsule, Rfl: 1 .  levothyroxine (SYNTHROID) 75 MCG tablet, Take 1 tablet by mouth daily., Disp: , Rfl:  .  meclizine (ANTIVERT) 25 MG tablet, Take 1 tablet by mouth 3 (three) times daily as needed., Disp: , Rfl:  .  Multiple Vitamins-Minerals (ZINC PO), Take by mouth., Disp: , Rfl:  .  Naproxen Sodium 220 MG CAPS, Take 1 tablet by mouth at bedtime as needed., Disp: , Rfl:  .  omeprazole (PRILOSEC) 20 MG capsule, Take 1 capsule by mouth daily., Disp: , Rfl:  .  simvastatin (ZOCOR) 40 MG tablet, Take 1 tablet (40 mg total) by mouth daily., Disp: 90 tablet, Rfl: 3 .  triamterene-hydrochlorothiazide (DYAZIDE) 37.5-25 MG capsule, Take 1 capsule by mouth daily., Disp: , Rfl:  .  zolpidem (AMBIEN) 5 MG tablet, Take 1 tablet (5 mg total) by mouth at bedtime as needed., Disp: 30 tablet, Rfl: 2  Patient Care Team: Jerrol Banana., MD as PCP - General (Family Medicine)    Objective:    Vitals: BP 111/69 (BP Location: Left Arm, Patient Position: Sitting, Cuff Size: Large)   Pulse 71   Temp (!) 96.8 F (36 C) (Other (Comment))   Resp 16   Ht 5\' 5"  (1.651 m)   Wt 179 lb (81.2 kg)   LMP  (LMP Unknown) Comment: 1980s  SpO2 96%   BMI 29.79 kg/m   Physical Exam Vitals reviewed.   HENT:     Head: Normocephalic and atraumatic.     Right Ear: External ear normal.     Left Ear: External ear normal.  Eyes:     General: No scleral icterus.    Conjunctiva/sclera: Conjunctivae normal.  Cardiovascular:     Rate and Rhythm: Normal rate and regular rhythm.     Pulses: Normal pulses.     Heart sounds: Normal heart sounds.  Pulmonary:     Effort: Pulmonary effort is normal.     Breath sounds: Normal breath sounds.  Abdominal:  Palpations: Abdomen is soft.  Musculoskeletal:        General: Swelling present.     Comments: Swelling of PIPs and DIPs of both hands.  Lymphadenopathy:     Cervical: No cervical adenopathy.  Skin:    General: Skin is warm and dry.  Neurological:     General: No focal deficit present.     Mental Status: She is alert and oriented to person, place, and time.  Psychiatric:        Mood and Affect: Mood normal.        Behavior: Behavior normal.        Thought Content: Thought content normal.        Judgment: Judgment normal.     Activities of Daily Living In your present state of health, do you have any difficulty performing the following activities: 10/01/2019 03/06/2019  Hearing? N N  Vision? N N  Difficulty concentrating or making decisions? N N  Walking or climbing stairs? N N  Dressing or bathing? N N  Doing errands, shopping? N N  Some recent data might be hidden    Fall Risk Assessment Fall Risk  10/01/2019 03/06/2019  Falls in the past year? 0 0  Number falls in past yr: 0 0  Injury with Fall? 0 0  Follow up Falls evaluation completed Falls evaluation completed     Depression Screen PHQ 2/9 Scores 10/01/2019 03/06/2019  PHQ - 2 Score 2 0  PHQ- 9 Score 4 -    No flowsheet data found.    Assessment & Plan:     Annual Wellness Visit  Reviewed patient's Family Medical History Reviewed and updated list of patient's medical providers Assessment of cognitive impairment was done Assessed patient's functional  ability Established a written schedule for health screening [place] Completed and Reviewed  Exercise Activities and Dietary recommendations Goals   None     Immunization History  Administered Date(s) Administered  . Influenza, High Dose Seasonal PF 07/18/2017, 08/02/2018, 03/06/2019  . Pneumococcal Conjugate-13 07/15/2016  . Pneumococcal Polysaccharide-23 07/18/2017  . Tdap 08/02/2018    Health Maintenance  Topic Date Due  . Hepatitis C Screening  Never done  . DEXA SCAN  Never done  . INFLUENZA VACCINE  01/20/2020  . MAMMOGRAM  06/06/2021  . TETANUS/TDAP  08/02/2028  . COLONOSCOPY  06/11/2029  . PNA vac Low Risk Adult  Completed     Discussed health benefits of physical activity, and encouraged her to engage in regular exercise appropriate for her age and condition.   1. Encounter for annual wellness visit (AWV) in Medicare patient   2. Annual physical exam Patient is up-to-date on screening issues.  She declines gynecologic exam today.  3. Neuropathy Increase gabapentin to 300 mg.  Return to clinic in 3 months.  ------------------------------------------------------------------------------------------------------------  I, [name], MD, have reviewed all documentation for this visit. The documentation on 10/05/19 for the exam, diagnosis, procedures, and orders are all accurate and complete.   Jessy Cybulski Cranford Mon, MD  [facility]

## 2019-10-01 ENCOUNTER — Encounter: Payer: Self-pay | Admitting: Family Medicine

## 2019-10-01 ENCOUNTER — Ambulatory Visit (INDEPENDENT_AMBULATORY_CARE_PROVIDER_SITE_OTHER): Payer: Medicare Other | Admitting: Family Medicine

## 2019-10-01 ENCOUNTER — Other Ambulatory Visit: Payer: Self-pay

## 2019-10-01 VITALS — BP 111/69 | HR 71 | Temp 96.8°F | Resp 16 | Ht 65.0 in | Wt 179.0 lb

## 2019-10-01 DIAGNOSIS — G629 Polyneuropathy, unspecified: Secondary | ICD-10-CM

## 2019-10-01 DIAGNOSIS — Z0000 Encounter for general adult medical examination without abnormal findings: Secondary | ICD-10-CM | POA: Diagnosis not present

## 2019-11-01 ENCOUNTER — Other Ambulatory Visit: Payer: Self-pay | Admitting: Family Medicine

## 2019-11-01 NOTE — Telephone Encounter (Signed)
Requested medication (s) are due for refill today: Due 11/13/2019  Requested medication (s) are on the active medication list: yes  Last refill: 08/16/2019   #30   2 refills  Future visit scheduled  yes 12/31/2019  Notes to clinic: Not delegated  Requested Prescriptions  Pending Prescriptions Disp Refills   zolpidem (AMBIEN) 5 MG tablet [Pharmacy Med Name: ZOLPIDEM 5MG  TABLETS] 30 tablet     Sig: TAKE 1 TABLET(5 MG) BY MOUTH AT BEDTIME AS NEEDED      Not Delegated - Psychiatry:  Anxiolytics/Hypnotics Failed - 11/01/2019  5:46 PM      Failed - This refill cannot be delegated      Failed - Urine Drug Screen completed in last 360 days.      Failed - Valid encounter within last 6 months    Recent Outpatient Visits           1 month ago Encounter for annual wellness visit (AWV) in Medicare patient   [facility] Jerrol Banana., MD   8 months ago Hypertension, unspecified type   [facility] Jerrol Banana., MD       Future Appointments             In 2 months Jerrol Banana., MD [facility] [place]

## 2019-11-14 ENCOUNTER — Telehealth: Payer: Self-pay

## 2019-11-14 NOTE — Telephone Encounter (Signed)
Copied from [place] [id number]. Topic: General - Inquiry >> Nov 14, 2019  8:07 AM Richardo Priest, NT wrote: Reason for CRM: Patient called in stating she is at [facility] in [place], with husband who has labs today. Patient is wondering if her lab orders for her cpe on 4/12 can be sent over to that [facility] so she can do labwork as well. Please advise.

## 2019-12-04 ENCOUNTER — Other Ambulatory Visit: Payer: Self-pay | Admitting: Family Medicine

## 2019-12-04 DIAGNOSIS — I10 Essential (primary) hypertension: Secondary | ICD-10-CM

## 2019-12-04 NOTE — Telephone Encounter (Signed)
triamterene-hydrochlorothiazide (DYAZIDE) 37.5-25 MG capsule     Patient requesting refill.    Pharmacy:  [facility] [place], [place] - [address] Phone:  [phone]  Fax:  [phone]

## 2019-12-05 ENCOUNTER — Other Ambulatory Visit: Payer: Self-pay | Admitting: Family Medicine

## 2019-12-05 DIAGNOSIS — I10 Essential (primary) hypertension: Secondary | ICD-10-CM

## 2019-12-05 MED ORDER — TRIAMTERENE-HCTZ 37.5-25 MG PO CAPS: 1.0000 | ORAL_CAPSULE | Freq: Every day | ORAL | 1 refills | Status: DC

## 2019-12-05 NOTE — Telephone Encounter (Signed)
Refill sent to pharmacy.   

## 2019-12-05 NOTE — Telephone Encounter (Signed)
Patient called in stating she would like to have a 90 day refill instead of 30. Pharmacy is attempting to contact office for the change. Please advise.

## 2019-12-05 NOTE — Addendum Note (Signed)
Addended by: Gerald Stabs on: 12/05/2019 01:49 PM   Modules accepted: Orders

## 2019-12-19 NOTE — Progress Notes (Signed)
Established patient visit   Patient: [name]   DOB: [birth year]   69 y.o. Female  MRN: [id number] Visit Date: 12/31/2019  I,Venie Montesinos S Amiri Riechers,acting as a scribe for [name], MD.,have documented all relevant documentation on the behalf of [name], MD,as directed by  [name], MD while in the presence of [name], MD.  Today's healthcare provider: [name], MD   Chief Complaint  Patient presents with  . Back Pain  . Follow-up  . Hand Problem  . Foot Problem  . Dizziness   Subjective    HPI HPI    Back Pain    This is a new problem.  There was not an injury that may have caused the pain.  Recent episode started more than a month ago.  The problem has been unchanged since onset.  Location: Mid right side .  Severity of the pain is mild.  Pain occurs intermittently.  The symptoms are aggravated by twisting.  Treatments: acetaminophen.  Treatment provided moderate relief.       Last edited by Dorian Pod, CMA on 12/31/2019  9:49 AM. (History)      Follow up for Neuropathy  The patient was last seen for this 3 months ago. Changes made at last visit include increase Gabapentin to 300 mg. Patient reports she has not started taking the 300 mg at bedtime.  She reports poor compliance with treatment. She feels that condition is Unchanged. She is not having side effects.  ----------------------------------------------------------------------------------------- The patient has multiple complaints as listed below.  She is worried that any of her complaints could be due to heart disease. Patient C/O dry hands x 3 months. Patient reports using gloves to wash dishes and reports that dryness has improved.  Patient C/O of a blister on left side of big toe. Patient reports blister comes in goes when wearing sneakers. Patient requesting to see a podiatrist.   Patient C/O of a hard place on the bottom of her right  foot.  Patient C/O dual ache on her mid right back.  It is not exertional and there are no GI or GU symptoms.  No rash.  Patient Active Problem List   Diagnosis Date Noted  . Encounter for screening colonoscopy   . Polyp of transverse colon   . BPPV (benign paroxysmal positional vertigo) 10/11/2018  . Lumbosacral radiculopathy at S1 08/28/2018  . Polyneuropathy 01/18/2018  . Gastroesophageal reflux disease without esophagitis 07/18/2017  . Primary insomnia 07/18/2017  . 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 Anxiety disorder, unspecified 11/05/2015  . Major depressive disorder, single episode, unspecified 11/05/2015  . Gout, unspecified 01/01/2015  . Hypothyroidism, unspecified 01/01/2015  . Other hyperlipidemia 01/01/2015   Social History   Tobacco Use  . Smoking status: Never Smoker  . Smokeless tobacco: Never Used  Vaping Use  . Vaping Use: Never used  Substance Use Topics  . Alcohol use: Not on file  . Drug use: Not on file       Medications: Outpatient Medications Prior to Visit  Medication Sig  . allopurinol (ZYLOPRIM) 300 MG tablet Take 1 tablet (300 mg total) by mouth daily.  . Ascorbic Acid (VITAMIN C) 500 MG CHEW Chew 2 tablets by mouth daily.  Marland Kitchen aspirin EC 81 MG tablet Take 1 tablet by mouth daily.  . Biotin (SUPER BIOTIN) 5 MG TABS Take 1 tablet by mouth daily.  . Cholecalciferol (VITAMIN D-1000 MAX ST) 25 MCG (1000  UT) tablet Take 1 tablet by mouth daily.  Marland Kitchen gabapentin (NEURONTIN) 100 MG capsule Take 1 capsule (100 mg total) by mouth at bedtime.  Marland Kitchen levothyroxine (SYNTHROID) 75 MCG tablet Take 1 tablet by mouth daily.  . Multiple Vitamins-Minerals (ZINC PO) Take by mouth.  . Naproxen Sodium 220 MG CAPS Take 1 tablet by mouth at bedtime as needed.  Marland Kitchen omeprazole (PRILOSEC) 20 MG capsule Take 1 capsule by mouth daily.  . pseudoephedrine (SUDAFED) 30 MG tablet Take 15 mg by mouth in the morning and at bedtime.  . triamterene-hydrochlorothiazide  (DYAZIDE) 37.5-25 MG capsule Take 1 each (1 capsule total) by mouth daily.  . Zinc 30 MG TABS Take 1 tablet by mouth daily.  Marland Kitchen zolpidem (AMBIEN) 5 MG tablet TAKE 1 TABLET(5 MG) BY MOUTH AT BEDTIME AS NEEDED  . simvastatin (ZOCOR) 40 MG tablet Take 1 tablet (40 mg total) by mouth daily.   No facility-administered medications prior to visit.    Review of Systems  Constitutional: Negative for appetite change, chills, fatigue and fever.  Eyes: Negative.   Respiratory: Negative for chest tightness and shortness of breath.   Cardiovascular: Negative for chest pain and palpitations.  Gastrointestinal: Negative for abdominal pain, nausea and vomiting.  Musculoskeletal:       Foot pain.  Neurological: Positive for dizziness and numbness. Negative for weakness.       Neuropathic discomfort.  Hematological: Negative.   Psychiatric/Behavioral: Negative.     Last CBC Lab Results  Component Value Date   WBC 7.2 03/07/2019   HGB 12.9 03/07/2019   HCT 39.1 03/07/2019   MCV 91 03/07/2019   MCH 29.9 03/07/2019   RDW 13.0 03/07/2019   PLT 214 03/07/2019      Objective    BP 107/70 (BP Location: Left Arm, Patient Position: Sitting, Cuff Size: Normal)   Pulse 81   Temp (!) 97.1 F (36.2 C) (Temporal)   Resp 16   Ht 5\' 5"  (1.651 m)   Wt 174 lb (78.9 kg)   LMP  (LMP Unknown) Comment: 1980s  BMI 28.96 kg/m  BP Readings from Last 3 Encounters:  12/31/19 107/70  10/01/19 111/69  06/12/19 (!) 111/94   Wt Readings from Last 3 Encounters:  12/31/19 174 lb (78.9 kg)  10/01/19 179 lb (81.2 kg)  06/12/19 170 lb 5.6 oz (77.3 kg)      Physical Exam Constitutional:      Appearance: Normal appearance.  HENT:     Head: Normocephalic and atraumatic.     Right Ear: Tympanic membrane, ear canal and external ear normal.     Left Ear: Tympanic membrane, ear canal and external ear normal.     Nose: Nose normal.  Eyes:     General: No scleral icterus.    Conjunctiva/sclera: Conjunctivae  normal.  Cardiovascular:     Rate and Rhythm: Normal rate and regular rhythm.     Heart sounds: Normal heart sounds.  Pulmonary:     Effort: Pulmonary effort is normal.     Breath sounds: Normal breath sounds.  Abdominal:     Palpations: Abdomen is soft.     Tenderness: There is no abdominal tenderness. There is no right CVA tenderness or left CVA tenderness.  Musculoskeletal:     Right hand: Decreased range of motion.     Left hand: Decreased range of motion.     Comments: Callus on both feet.  Skin:    General: Skin is warm and dry.  Neurological:  General: No focal deficit present.     Mental Status: She is alert and oriented to person, place, and time.     Comments: Minimal nystagmus noted.  Psychiatric:        Mood and Affect: Mood normal.        Behavior: Behavior normal.        Thought Content: Thought content normal.        Judgment: Judgment normal.       No results found for any visits on 12/31/19.  Assessment & Plan     1. Neuropathy Increase gabapentin from 100 to 300 mg at bedtime. - Vitamin B12 - Uric acid - gabapentin (NEURONTIN) 300 MG capsule; Take 1 capsule (300 mg total) by mouth at bedtime.  Dispense: 90 capsule; Refill: 3 - Ambulatory referral to Occupational Therapy  2. Stiffness of hand joint, unspecified laterality  - CBC with Differential/Platelet - Sedimentation rate - Uric acid - Ambulatory referral to Occupational Therapy  3. Corn or callus Refer to podiatry. - Ambulatory referral to Podiatry  4. Chronic mid back pain This is in the right lower thoracic region.  Not exertional.  It is not cardiac.  Obtain x-rays of T-spine and LS-spine and a general abdominal ultrasound. - CBC with Differential/Platelet - Sedimentation rate - DG Thoracic Spine 1 View - DG Lumbar Spine Complete - US Abdomen Complete - DG Thoracic Spine 4V - DG Thoracic Spine W/Swimmers  5. Other hyperlipidemia  - Lipid Panel With LDL/HDL Ratio  6.  Hypothyroidism, unspecified type  - TSH  7. Hypertension, unspecified type  - Comprehensive metabolic panel  8. Primary osteoarthritis, left hand Refer to occupational therapy to help with her range of motion.  May need referral to surgical specialists of hand surgery - Vitamin B12 - Sedimentation rate - Uric acid - Ambulatory referral to Occupational Therapy  9. Benign paroxysmal positional vertigo, unspecified laterality May need ENT or neurology referral but I think this is a benign process at this time. - CBC with Differential/Platelet  10. Generalized abdominal pain  - US Abdomen Complete   No follow-ups on file.      I, [name], MD, have reviewed all documentation for this visit. The documentation on 12/31/19 for the exam, diagnosis, procedures, and orders are all accurate and complete.    Richard Cranford Mon, MD  [facility] [phone] (phone) [phone] (fax)  [place]

## 2019-12-31 ENCOUNTER — Ambulatory Visit
Admission: RE | Admit: 2019-12-31 | Discharge: 2019-12-31 | Disposition: A | Discharge status: Home / Self Care | Payer: Medicare Other | Source: Ambulatory Visit | Attending: Family Medicine | Admitting: Family Medicine

## 2019-12-31 ENCOUNTER — Other Ambulatory Visit: Payer: Self-pay

## 2019-12-31 ENCOUNTER — Ambulatory Visit (INDEPENDENT_AMBULATORY_CARE_PROVIDER_SITE_OTHER): Payer: Medicare Other | Admitting: Family Medicine

## 2019-12-31 ENCOUNTER — Ambulatory Visit
Admission: RE | Admit: 2019-12-31 | Discharge: 2019-12-31 | Disposition: A | Discharge status: Home / Self Care | Payer: Medicare Other | Attending: Family Medicine | Admitting: Family Medicine

## 2019-12-31 ENCOUNTER — Encounter: Payer: Self-pay | Admitting: Family Medicine

## 2019-12-31 VITALS — BP 107/70 | HR 81 | Temp 97.1°F | Resp 16 | Ht 65.0 in | Wt 174.0 lb

## 2019-12-31 DIAGNOSIS — G8929 Other chronic pain: Secondary | ICD-10-CM | POA: Diagnosis not present

## 2019-12-31 DIAGNOSIS — L84 Corns and callosities: Secondary | ICD-10-CM | POA: Diagnosis not present

## 2019-12-31 DIAGNOSIS — M19042 Primary osteoarthritis, left hand: Secondary | ICD-10-CM

## 2019-12-31 DIAGNOSIS — M549 Dorsalgia, unspecified: Secondary | ICD-10-CM

## 2019-12-31 DIAGNOSIS — E039 Hypothyroidism, unspecified: Secondary | ICD-10-CM

## 2019-12-31 DIAGNOSIS — G629 Polyneuropathy, unspecified: Secondary | ICD-10-CM | POA: Diagnosis not present

## 2019-12-31 DIAGNOSIS — M25649 Stiffness of unspecified hand, not elsewhere classified: Secondary | ICD-10-CM

## 2019-12-31 DIAGNOSIS — I10 Essential (primary) hypertension: Secondary | ICD-10-CM

## 2019-12-31 DIAGNOSIS — H8110 Benign paroxysmal vertigo, unspecified ear: Secondary | ICD-10-CM

## 2019-12-31 DIAGNOSIS — R1084 Generalized abdominal pain: Secondary | ICD-10-CM

## 2019-12-31 DIAGNOSIS — E7849 Other hyperlipidemia: Secondary | ICD-10-CM

## 2019-12-31 DIAGNOSIS — M545 Low back pain: Secondary | ICD-10-CM | POA: Diagnosis not present

## 2019-12-31 MED ORDER — GABAPENTIN 300 MG PO CAPS: 300.0000 mg | ORAL_CAPSULE | Freq: Every day | ORAL | 3 refills | Status: DC

## 2019-12-31 NOTE — Patient Instructions (Signed)
[facility]  [address] [place], [place] [address]  Phone: [phone]  Business Hours  Monday to Thursday: 8:00 a.m. - 5:00 p.m.

## 2019-12-31 NOTE — Assessment & Plan Note (Signed)
Worsening Increase Gabapentin to 300 mg at bedtime Check labs Follow up in 2 months

## 2020-01-01 LAB — CBC WITH DIFFERENTIAL/PLATELET
Basophils Absolute: 0.1 10*3/uL (ref 0.0–0.2)
Basos: 1 %
EOS (ABSOLUTE): 0.1 10*3/uL (ref 0.0–0.4)
Eos: 2 %
Hematocrit: 37.1 % (ref 34.0–46.6)
Hemoglobin: 12.6 g/dL (ref 11.1–15.9)
Immature Grans (Abs): 0.1 10*3/uL (ref 0.0–0.1)
Immature Granulocytes: 1 %
Lymphocytes Absolute: 1.3 10*3/uL (ref 0.7–3.1)
Lymphs: 23 %
MCH: 30.6 pg (ref 26.6–33.0)
MCHC: 34 g/dL (ref 31.5–35.7)
MCV: 90 fL (ref 79–97)
Monocytes Absolute: 0.4 10*3/uL (ref 0.1–0.9)
Monocytes: 7 %
Neutrophils Absolute: 3.8 10*3/uL (ref 1.4–7.0)
Neutrophils: 66 %
Platelets: 205 10*3/uL (ref 150–450)
RBC: 4.12 x10E6/uL (ref 3.77–5.28)
RDW: 13.1 % (ref 11.7–15.4)
WBC: 5.8 10*3/uL (ref 3.4–10.8)

## 2020-01-01 LAB — COMPREHENSIVE METABOLIC PANEL
ALT: 24 IU/L (ref 0–32)
AST: 22 IU/L (ref 0–40)
Albumin/Globulin Ratio: 1.6 (ref 1.2–2.2)
Albumin: 4.5 g/dL (ref 3.8–4.8)
Alkaline Phosphatase: 111 IU/L (ref 48–121)
BUN/Creatinine Ratio: 24 (ref 12–28)
BUN: 24 mg/dL (ref 8–27)
Bilirubin Total: 0.6 mg/dL (ref 0.0–1.2)
CO2: 24 mmol/L (ref 20–29)
Calcium: 9.8 mg/dL (ref 8.7–10.3)
Chloride: 102 mmol/L (ref 96–106)
Creatinine, Ser: 0.98 mg/dL (ref 0.57–1.00)
GFR calc Af Amer: 69 mL/min/{1.73_m2} (ref >59)
GFR calc non Af Amer: 59 mL/min/{1.73_m2} — ABNORMAL LOW (ref >59)
Globulin, Total: 2.8 g/dL (ref 1.5–4.5)
Glucose: 94 mg/dL (ref 65–99)
Potassium: 3.4 mmol/L — ABNORMAL LOW (ref 3.5–5.2)
Sodium: 141 mmol/L (ref 134–144)
Total Protein: 7.3 g/dL (ref 6.0–8.5)

## 2020-01-01 LAB — LIPID PANEL WITH LDL/HDL RATIO
Cholesterol, Total: 189 mg/dL (ref 100–199)
HDL: 41 mg/dL (ref >39)
LDL Chol Calc (NIH): 106 mg/dL — ABNORMAL HIGH (ref 0–99)
LDL/HDL Ratio: 2.6 ratio (ref 0.0–3.2)
Triglycerides: 244 mg/dL — ABNORMAL HIGH (ref 0–149)
VLDL Cholesterol Cal: 42 mg/dL — ABNORMAL HIGH (ref 5–40)

## 2020-01-01 LAB — TSH: TSH: 2.07 u[IU]/mL (ref 0.450–4.500)

## 2020-01-01 LAB — URIC ACID: Uric Acid: 5.8 mg/dL (ref 3.0–7.2)

## 2020-01-01 LAB — SEDIMENTATION RATE: Sed Rate: 9 mm/hr (ref 0–40)

## 2020-01-01 LAB — VITAMIN B12: Vitamin B-12: >2000 pg/mL — ABNORMAL HIGH (ref 232–1245)

## 2020-01-02 ENCOUNTER — Telehealth: Payer: Self-pay

## 2020-01-02 NOTE — Telephone Encounter (Signed)
-----   Message from Jerrol Banana., MD sent at 01/01/2020  4:44 PM EDT ----- Postsurgical changes noted on x-ray.

## 2020-01-02 NOTE — Telephone Encounter (Signed)
Seen by patient [name] on 1/50/5697 5:44 PM

## 2020-01-02 NOTE — Telephone Encounter (Signed)
Seen by patient [name] on 08/07/9808 8:34 PM

## 2020-01-02 NOTE — Telephone Encounter (Signed)
-----   Message from Jerrol Banana., MD sent at 01/01/2020  4:46 PM EDT ----- Labs stable.  Potassium borderline low.  Try eating more potassium rich foods such as bananas and green leafy vegetables

## 2020-01-08 ENCOUNTER — Other Ambulatory Visit: Payer: Self-pay

## 2020-01-08 ENCOUNTER — Ambulatory Visit: Payer: Medicare Other | Admitting: Podiatry

## 2020-01-08 DIAGNOSIS — L989 Disorder of the skin and subcutaneous tissue, unspecified: Secondary | ICD-10-CM

## 2020-01-08 DIAGNOSIS — G629 Polyneuropathy, unspecified: Secondary | ICD-10-CM | POA: Diagnosis not present

## 2020-01-08 DIAGNOSIS — Q667 Congenital pes cavus, unspecified foot: Secondary | ICD-10-CM | POA: Diagnosis not present

## 2020-01-09 ENCOUNTER — Encounter: Payer: Self-pay | Admitting: Podiatry

## 2020-01-09 NOTE — Progress Notes (Signed)
Subjective:  Patient ID: [name], female    DOB: [birth year],  MRN: [id number]  Chief Complaint  Patient presents with  . Callouses    pt is here for a callous trim    69 y.o. female presents with the above complaint.  Patient presents with complaint of bilateral submetatarsal 2 porokeratosis/benign skin lesion.  Patient states that it has progressively caused her a lot to be in pain.  She states that she has neuropathy on her foot.  Patient has tried stand shoes but has not helped much.  Patient currently wears mostly flip-flops.  She would like to know if these could be debrided down.  It is very painful to walk on.  She has not had any kind of orthotics or any preventive measures.  She denies any other acute complaints.   Review of Systems: Negative except as noted in the HPI. Denies N/V/F/Ch.  Past Medical History:  Diagnosis Date  . Actinic keratosis   . Basal cell carcinoma 05/31/2019   left dorsal forearm  . Cancer ([place])    Meanoma,basal cell  . GERD (gastroesophageal reflux disease)   . Hypothyroidism   . Melanoma ([place])    chest    Current Outpatient Medications:  .  allopurinol (ZYLOPRIM) 300 MG tablet, Take 1 tablet (300 mg total) by mouth daily., Disp: 90 tablet, Rfl: 3 .  Ascorbic Acid (VITAMIN C) 500 MG CHEW, Chew 2 tablets by mouth daily., Disp: , Rfl:  .  aspirin EC 81 MG tablet, Take 1 tablet by mouth daily., Disp: , Rfl:  .  Biotin (SUPER BIOTIN) 5 MG TABS, Take 1 tablet by mouth daily., Disp: , Rfl:  .  Cholecalciferol (VITAMIN D-1000 MAX ST) 25 MCG (1000 UT) tablet, Take 1 tablet by mouth daily., Disp: , Rfl:  .  gabapentin (NEURONTIN) 300 MG capsule, Take 1 capsule (300 mg total) by mouth at bedtime., Disp: 90 capsule, Rfl: 3 .  levothyroxine (SYNTHROID) 75 MCG tablet, Take 1 tablet by mouth daily., Disp: , Rfl:  .  Multiple Vitamins-Minerals (ZINC PO), Take by mouth., Disp: , Rfl:  .  Naproxen Sodium 220 MG CAPS, Take 1 tablet by mouth at bedtime  as needed., Disp: , Rfl:  .  omeprazole (PRILOSEC) 20 MG capsule, Take 1 capsule by mouth daily., Disp: , Rfl:  .  pseudoephedrine (SUDAFED) 30 MG tablet, Take 15 mg by mouth in the morning and at bedtime., Disp: , Rfl:  .  triamterene-hydrochlorothiazide (DYAZIDE) 37.5-25 MG capsule, Take 1 each (1 capsule total) by mouth daily., Disp: 90 capsule, Rfl: 1 .  Zinc 30 MG TABS, Take 1 tablet by mouth daily., Disp: , Rfl:  .  zolpidem (AMBIEN) 5 MG tablet, TAKE 1 TABLET(5 MG) BY MOUTH AT BEDTIME AS NEEDED, Disp: 30 tablet, Rfl: 3 .  simvastatin (ZOCOR) 40 MG tablet, Take 1 tablet (40 mg total) by mouth daily., Disp: 90 tablet, Rfl: 3  Social History   Tobacco Use  Smoking Status Never Smoker  Smokeless Tobacco Never Used    No Known Allergies Objective:  There were no vitals filed for this visit. There is no height or weight on file to calculate BMI. Constitutional Well developed. Well nourished.  Vascular Dorsalis pedis pulses palpable bilaterally. Posterior tibial pulses palpable bilaterally. Capillary refill normal to all digits.  No cyanosis or clubbing noted. Pedal hair growth normal.  Neurologic Normal speech. Oriented to person, place, and time. Epicritic sensation to light touch grossly present bilaterally.  Dermatologic  hyperkeratotic/benign  skin lesion noted to bilateral submetatarsal 2.  Central nucleated core noted bilaterally.  No pinpoint bleeding noted upon debridement.  No underlying wounds noted.  Orthopedic: Normal joint ROM without pain or crepitus bilaterally. No visible deformities. No bony tenderness. Gait examination shows high arch foot structure with primarily anterior cavus foot structure.   Radiographs: None Assessment:   1. Neuropathy   2. Benign skin lesion   3. Pes cavus    Plan:  Patient was evaluated and treated and all questions answered.  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  Given that she has neuropathy to both lower extremity from a history of lumbar fusion she is a high risk candidate for developing ulceration.   -Using chisel blade and hand of the lesions were aggressively debrided down to healthy striated tissue.  No complications noted.  Bilateral pes cavus foot structure -I explained to the patient the etiology of pes cavus with underlying neuropathy and various treatment options were discussed.  I believe patient will benefit from custom-made orthotics that she can wear and regular sneakers/tennis shoes to help support the arch of the foot control the hindfoot motion and therefore take the pressure away from submetatarsal 2. -Should be scheduled to see Liliane Channel for custom-made orthotics with offloading of bilateral submetatarsal 2.  Return in about 3 months (around 04/09/2020), or See Dr. Posey Pronto, for See Liliane Channel for orthotics ASAP.

## 2020-01-10 ENCOUNTER — Ambulatory Visit
Admission: RE | Admit: 2020-01-10 | Discharge: 2020-01-10 | Disposition: A | Discharge status: Home / Self Care | Payer: Medicare Other | Source: Ambulatory Visit | Attending: Family Medicine | Admitting: Family Medicine

## 2020-01-10 ENCOUNTER — Other Ambulatory Visit: Payer: Self-pay

## 2020-01-10 DIAGNOSIS — K838 Other specified diseases of biliary tract: Secondary | ICD-10-CM | POA: Diagnosis not present

## 2020-01-10 DIAGNOSIS — M549 Dorsalgia, unspecified: Secondary | ICD-10-CM | POA: Insufficient documentation

## 2020-01-10 DIAGNOSIS — G8929 Other chronic pain: Secondary | ICD-10-CM | POA: Diagnosis not present

## 2020-01-10 DIAGNOSIS — R1084 Generalized abdominal pain: Secondary | ICD-10-CM | POA: Insufficient documentation

## 2020-01-10 DIAGNOSIS — Q600 Renal agenesis, unilateral: Secondary | ICD-10-CM | POA: Diagnosis not present

## 2020-01-10 DIAGNOSIS — K7689 Other specified diseases of liver: Secondary | ICD-10-CM | POA: Diagnosis not present

## 2020-01-10 DIAGNOSIS — K828 Other specified diseases of gallbladder: Secondary | ICD-10-CM | POA: Diagnosis not present

## 2020-01-14 ENCOUNTER — Ambulatory Visit: Payer: Medicare Other | Attending: Family Medicine | Admitting: Occupational Therapy

## 2020-01-14 ENCOUNTER — Other Ambulatory Visit: Payer: Self-pay

## 2020-01-14 ENCOUNTER — Encounter: Payer: Self-pay | Admitting: Occupational Therapy

## 2020-01-14 DIAGNOSIS — M25641 Stiffness of right hand, not elsewhere classified: Secondary | ICD-10-CM | POA: Insufficient documentation

## 2020-01-14 DIAGNOSIS — M79641 Pain in right hand: Secondary | ICD-10-CM | POA: Diagnosis not present

## 2020-01-14 DIAGNOSIS — M79642 Pain in left hand: Secondary | ICD-10-CM | POA: Diagnosis not present

## 2020-01-14 DIAGNOSIS — M25642 Stiffness of left hand, not elsewhere classified: Secondary | ICD-10-CM | POA: Insufficient documentation

## 2020-01-14 DIAGNOSIS — M6281 Muscle weakness (generalized): Secondary | ICD-10-CM | POA: Insufficient documentation

## 2020-01-14 NOTE — Therapy (Signed)
[facility] [address], [place], [address] Phone: [phone]   Fax:  [phone]  Occupational Therapy Evaluation  Patient Details  Name: [name] MRN: [id number] Date of Birth: 05-17-1951 Referring Provider (OT): Miguel Aschoff   Encounter Date: 01/14/2020   OT End of Session - 01/14/20 1312    Visit Number 1    Number of Visits 3    Date for OT Re-Evaluation 02/18/20    OT Start Time 1108    OT Stop Time 1214    OT Time Calculation (min) 66 min    Activity Tolerance Patient tolerated treatment well    Behavior During Therapy [facility] for tasks assessed/performed           Past Medical History:  Diagnosis Date  . Actinic keratosis   . Basal cell carcinoma 05/31/2019   left dorsal forearm  . Cancer ([place])    Meanoma,basal cell  . GERD (gastroesophageal reflux disease)   . Hypothyroidism   . Melanoma ([place])    chest    Past Surgical History:  Procedure Laterality Date  . ABDOMINAL HYSTERECTOMY    . BACK SURGERY    . BREAST CYST ASPIRATION Left    Neg  . COLONOSCOPY WITH PROPOFOL N/A 06/12/2019   Procedure: COLONOSCOPY WITH PROPOFOL;  Surgeon: Lucilla Lame, MD;  Location: [facility] ENDOSCOPY;  Service: Endoscopy;  Laterality: N/A;  . EYE SURGERY     cataracts  . FOOT SURGERY    . MELANOMA EXCISION      There were no vitals filed for this visit.   Subjective Assessment - 01/14/20 1302    Subjective  Both my hands are stiff -and worse in the morning and winter - but at night time cannot sleep with them straight - it hurts and pain if using them a lot during day - dropping things and cannot grip things tight    Pertinent History Pt with possible OA in hands with L hand worse than R - refer by her PCP for hand stiffness and pain - pt report gradually got worse - denies any sensory changes    Patient Stated Goals Want to increase my motion and decrease pain - to have more strength to  make fist and drop things less    Currently in Pain? Yes    Pain Score 6     Pain Location Hand    Pain Orientation Right;Left    Pain Descriptors / Indicators Aching;Tightness    Pain Type Chronic pain    Pain Onset More than a month ago    Pain Frequency Intermittent    Aggravating Factors  making fist, using them a lot , night time trying to keep them straight             [facility] OT Assessment - 01/14/20 0001      Assessment   Medical Diagnosis OA in hands     Referring Provider (OT) Miguel Aschoff    Onset Date/Surgical Date 12/01/19    Hand Dominance Right    Prior Therapy --   no     Home  Environment   Lives With Spouse      Prior Function   Vocation Retired    Leisure likes to [employer] , crafts , play cards, read on kindle       Strength   Right Hand Grip (lbs) 36    Right Hand Lateral Pinch 14 lbs    Right Hand 3  Point Pinch 8 lbs    Left Hand Grip (lbs) 25    Left Hand Lateral Pinch 13 lbs    Left Hand 3 Point Pinch 10 lbs      Right Hand AROM   R Thumb MCP 0-60 --   hyper extention of MC    R Thumb Opposition to Index --   opposition to base of 5th    R Index  MCP 0-90 80 Degrees    R Index PIP 0-100 90 Degrees    R Long  MCP 0-90 85 Degrees    R Long PIP 0-100 100 Degrees    R Ring  MCP 0-90 100 Degrees    R Ring PIP 0-100 80 Degrees    R Little  MCP 0-90 100 Degrees    R Little PIP 0-100 90 Degrees      Left Hand AROM   L Thumb MCP 0-60 --   hyper extention of MC    L Thumb Opposition to Index --   base of 5th    L Index  MCP 0-90 90 Degrees    L Index PIP 0-100 85 Degrees    L Long  MCP 0-90 90 Degrees    L Long PIP 0-100 70 Degrees    L Ring  MCP 0-90 90 Degrees    L Ring PIP 0-100 70 Degrees    L Little  MCP 0-90 90 Degrees    L Little PIP 0-100 90 Degrees                    OT Treatments/Exercises (OP) - 01/14/20 0001      RUE Paraffin   Number Minutes Paraffin 10 Minutes    RUE Paraffin Location Hand    Comments prior to  review HEP - increase ROM       LUE Paraffin   Number Minutes Paraffin 10 Minutes    LUE Paraffin Location Hand    Comments prior to review of HEP - increase ROM           review with pt HEP    Heat -  AM and PM  Tapping of digits extention 10 reps Tendon glides - blocked and pain free - 10 reps  Opposition to all digits - keeping oval - 5 reps  Ed and hand out review on joint protection and AE        OT Education - 01/14/20 1312    Education Details findings of eval and HEP    Person(s) Educated Patient    Methods Explanation;Demonstration;Verbal cues;Tactile cues;Handout    Comprehension Verbal cues required;Returned demonstration;Verbalized understanding               OT Long Term Goals - 01/14/20 1317      OT LONG TERM GOAL #1   Title Pain on PRWHE improve with more than 12 points    Baseline pain on PRHWE at eval 22/50    Time 4    Period Weeks    Status New    Target Date 02/04/20      OT LONG TERM GOAL #2   Title Pt to be independent in HEP to decrease pain , increase AROM in digits and grip strength to hold objects better    Baseline no knowledge    Time 5    Period Weeks    Status New    Target Date 02/18/20      OT LONG TERM GOAL #3  Title Pt to demo 3 joint protection and AE use to increase ease and decrease pain in hands    Baseline no knowledge    Time 5    Period Weeks    Status New    Target Date 02/18/20                 Plan - 01/14/20 1313    Clinical Impression Statement Pt refer to OT /hand therapy for bilateral hand stiffness and pain - pt show decrease AROM of PIP's and composite fist  more than MC flexion - with arthritic changes at PIP's and DIP's - pain increase at night time and increase use to 6/10 - no tenderness over A1pulley's and denies any triggering - pt show decrease grip and 3 point pinch - L hand worse than R - pt can benefit from OT services to decrease pain and stiffness and increase strength - education on  joint protection and AE trng    OT Occupational Profile and History Problem Focused Assessment - Including review of records relating to presenting problem    Occupational performance deficits (Please refer to evaluation for details): ADL's;IADL's;Rest and Sleep;Play;Leisure    Body Structure / Function / Physical Skills ADL;Decreased knowledge of use of DME;ROM;UE functional use;Dexterity;Pain;Strength;IADL    Rehab Potential Fair    Clinical Decision Making Limited treatment options, no task modification necessary    Comorbidities Affecting Occupational Performance: May have comorbidities impacting occupational performance   OA and arthritic changes to digits   Modification or Assistance to Complete Evaluation  No modification of tasks or assist necessary to complete eval    OT Frequency --   1 x wk for 1 wks , biweekly for 4 wks   OT Duration --   5 wks   OT Treatment/Interventions Self-care/ADL training;Therapeutic exercise;Patient/family education;Paraffin;DME and/or AE instruction;Manual Therapy    Plan assess progress with HEP    OT Home Exercise Plan see pt instruction    Consulted and Agree with Plan of Care Patient           Patient will benefit from skilled therapeutic intervention in order to improve the following deficits and impairments:   Body Structure / Function / Physical Skills: ADL, Decreased knowledge of use of DME, ROM, UE functional use, Dexterity, Pain, Strength, IADL       Visit Diagnosis: Stiffness of left hand, not elsewhere classified - Plan: Ot plan of care cert/re-cert  Stiffness of right hand, not elsewhere classified - Plan: Ot plan of care cert/re-cert  Pain in left hand - Plan: Ot plan of care cert/re-cert  Pain in right hand - Plan: Ot plan of care cert/re-cert  Muscle weakness (generalized) - Plan: Ot plan of care cert/re-cert    Problem List Patient Active Problem List   Diagnosis Date Noted  . Encounter for screening colonoscopy   .  Polyp of transverse colon   . BPPV (benign paroxysmal positional vertigo) 10/11/2018  . Lumbosacral radiculopathy at S1 08/28/2018  . Neuropathy 01/18/2018  . Gastroesophageal reflux disease without esophagitis 07/18/2017  . Primary insomnia 07/18/2017  . 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 Anxiety disorder, unspecified 11/05/2015  . Major depressive disorder, single episode, unspecified 11/05/2015  . Gout, unspecified 01/01/2015  . Hypothyroidism, unspecified 01/01/2015  . Other hyperlipidemia 01/01/2015    Rosalyn Gess OTR/L,CLT 01/14/2020, 1:24 PM  [facility] [address] [place], [address] Phone: [phone]   Fax:  [phone]  Name: [name] MRN: [id number] Date of Birth: [birth year]

## 2020-01-14 NOTE — Patient Instructions (Signed)
Heat -  AM and PM  Tapping of digits extention 10 reps Tendon glides - blocked and pain free - 10 reps  Opposition to all digits - keeping oval - 5 reps  Ed and hand out review on joint protection and AE

## 2020-01-15 ENCOUNTER — Telehealth: Payer: Self-pay | Admitting: *Deleted

## 2020-01-15 ENCOUNTER — Other Ambulatory Visit: Payer: Self-pay | Admitting: Family Medicine

## 2020-01-15 MED ORDER — LEVOTHYROXINE SODIUM 75 MCG PO TABS: 75.0000 ug | ORAL_TABLET | Freq: Every day | ORAL | 3 refills | Status: DC

## 2020-01-15 NOTE — Telephone Encounter (Signed)
-----   Message from Jerrol Banana., MD sent at 01/15/2020 11:05 AM EDT ----- No gallstones or obvious gallbladder disease.  Possible fatty liver so continue to work on diet and exercise.  If patient is feeling better I will see her back in her appointment in September.  If still having symptoms come in soon for follow-up to discuss options.

## 2020-01-15 NOTE — Telephone Encounter (Signed)
LMOVM for pt to return call. Okay for [facility] triage nurse to give patient results.  

## 2020-01-15 NOTE — Telephone Encounter (Signed)
Medication Refill - Medication: levothyroxine (SYNTHROID) 75 MCG tablet   Has the patient contacted their pharmacy? Yes.   (Agent: If no, request that the patient contact the pharmacy for the refill.) (Agent: If yes, when and what did the pharmacy advise?)  Preferred Pharmacy (with phone number or street name):  [id number] - [place] [address], [place] [address] [place]  [address] [place] [address]  Phone: [phone] Fax: [phone]     Agent: Please be advised that RX refills may take up to 3 business days. We ask that you follow-up with your pharmacy.

## 2020-01-15 NOTE — Telephone Encounter (Signed)
Requested medication (s) are due for refill today: no  Requested medication (s) are on the active medication list: yes  Last refill:  03/06/2019  Future visit scheduled:  yes  Notes to clinic:  medication last filled by historical provider  Review for refill   Requested Prescriptions  Pending Prescriptions Disp Refills   levothyroxine (SYNTHROID) 75 MCG tablet      Sig: Take 1 tablet (75 mcg total) by mouth daily.      Endocrinology:  Hypothyroid Agents Failed - 01/15/2020  1:33 PM      Failed - TSH needs to be rechecked within 3 months after an abnormal result. Refill until TSH is due.      Passed - TSH in normal range and within 360 days    TSH  Date Value Ref Range Status  12/31/2019 2.070 0.450 - 4.500 uIU/mL Final          Passed - Valid encounter within last 12 months    Recent Outpatient Visits           2 weeks ago Neuropathy   [facility] Jerrol Banana., MD   3 months ago Encounter for annual wellness visit (AWV) in Medicare patient   [facility] Jerrol Banana., MD   10 months ago Hypertension, unspecified type   [facility] Jerrol Banana., MD       Future Appointments             In 1 month Jerrol Banana., MD [facility], [place]   In 3 months [facility], [place], MD [place]

## 2020-01-16 NOTE — Telephone Encounter (Signed)
Patient given results as noted below by Dr. Rosanna Randy on 01/15/20, she verbalized understanding and says she read this on [place]. She says she's not having any pain and will wait until her September appointment. She says she will do some investigating on the fatty liver to see what can be done about that.

## 2020-01-21 ENCOUNTER — Other Ambulatory Visit: Payer: Self-pay

## 2020-01-21 ENCOUNTER — Ambulatory Visit: Payer: Medicare Other | Attending: Family Medicine | Admitting: Occupational Therapy

## 2020-01-21 DIAGNOSIS — M79641 Pain in right hand: Secondary | ICD-10-CM | POA: Diagnosis not present

## 2020-01-21 DIAGNOSIS — M25642 Stiffness of left hand, not elsewhere classified: Secondary | ICD-10-CM | POA: Diagnosis not present

## 2020-01-21 DIAGNOSIS — M25641 Stiffness of right hand, not elsewhere classified: Secondary | ICD-10-CM | POA: Diagnosis not present

## 2020-01-21 DIAGNOSIS — M6281 Muscle weakness (generalized): Secondary | ICD-10-CM | POA: Diagnosis not present

## 2020-01-21 DIAGNOSIS — M79642 Pain in left hand: Secondary | ICD-10-CM | POA: Insufficient documentation

## 2020-01-21 NOTE — Therapy (Signed)
[place] [facility] [address], [place], [address] Phone: [phone]   Fax:  [phone]  Occupational Therapy Treatment  Patient Details  Name: [name] MRN: [id number] Date of Birth: 09/23/1950 Referring Provider (OT): Miguel Aschoff   Encounter Date: 01/21/2020   OT End of Session - 01/21/20 1103    Visit Number 2    Number of Visits 3    Date for OT Re-Evaluation 02/18/20    OT Start Time 1016    OT Stop Time 1100    OT Time Calculation (min) 44 min    Activity Tolerance Patient tolerated treatment well    Behavior During Therapy [facility] for tasks assessed/performed           Past Medical History:  Diagnosis Date  . Actinic keratosis   . Basal cell carcinoma 05/31/2019   left dorsal forearm  . Cancer ([place])    Meanoma,basal cell  . GERD (gastroesophageal reflux disease)   . Hypothyroidism   . Melanoma ([place])    chest    Past Surgical History:  Procedure Laterality Date  . ABDOMINAL HYSTERECTOMY    . BACK SURGERY    . BREAST CYST ASPIRATION Left    Neg  . COLONOSCOPY WITH PROPOFOL N/A 06/12/2019   Procedure: COLONOSCOPY WITH PROPOFOL;  Surgeon: Lucilla Lame, MD;  Location: [facility] ENDOSCOPY;  Service: Endoscopy;  Laterality: N/A;  . EYE SURGERY     cataracts  . FOOT SURGERY    . MELANOMA EXCISION      There were no vitals filed for this visit.   Subjective Assessment - 01/21/20 1101    Subjective  Did okay - my neighbour gave me some putty to squeeze - my fingers little more sore - but I think I can make better fist with my L hand    Pertinent History Pt with possible OA in hands with L hand worse than R - refer by her PCP for hand stiffness and pain - pt report gradually got worse - denies any sensory changes    Patient Stated Goals Want to increase my motion and decrease pain - to have more strength to make fist and drop things less    Currently in Pain? Yes    Pain Score 5      Pain Location Hand    Pain Orientation Right;Left    Pain Descriptors / Indicators Sore    Pain Type Chronic pain    Pain Onset More than a month ago    Pain Frequency Intermittent    Aggravating Factors  making fist              [facility] OT Assessment - 01/21/20 0001      Strength   Right Hand Grip (lbs) 36    Right Hand 3 Point Pinch 9 lbs    Left Hand Grip (lbs) 34    Left Hand 3 Point Pinch 9 lbs      Left Hand AROM   L Index PIP 0-100 90 Degrees    L Long PIP 0-100 80 Degrees    L Ring PIP 0-100 75 Degrees    L Little PIP 0-100 95 Degrees            Measurements taken for ROM of hands and grip /prehension   L hand digits increase some -  But soreness about 5/10  Used some putty this past week - watching tv- her neighbor gave her it  OT Treatments/Exercises (OP) - 01/21/20 0001      RUE Paraffin   Number Minutes Paraffin 10 Minutes    RUE Paraffin Location Hand    Comments prior to soft tissue and ROM       LUE Paraffin   Number Minutes Paraffin 10 Minutes    LUE Paraffin Location Hand    Comments prior to soft tissue and ROM          soft tissue mobs done to bilateral hands prior to ROM MC spreads - and graston tool nr 2 -brushing on volar digits and palm prior to AROM    review with pt HEP - and reinforce AROM - gentle - not to force   HEP - heat in the am and then pm Tapping of digits extention 10 reps Tendon glides - blocked and pain free - 10 reps  Opposition to all digits - keeping oval - 5 reps  Review again with pt  joint protection and AE  - avoid tight and sustained grip -and use larger joints        OT Education - 01/21/20 1103    Education Details Review HEP    Person(s) Educated Patient    Methods Explanation;Demonstration;Verbal cues;Tactile cues;Handout    Comprehension Verbal cues required;Returned demonstration;Verbalized understanding               OT Long Term Goals - 01/14/20 1317      OT LONG TERM  GOAL #1   Title Pain on PRWHE improve with more than 12 points    Baseline pain on PRHWE at eval 22/50    Time 4    Period Weeks    Status New    Target Date 02/04/20      OT LONG TERM GOAL #2   Title Pt to be independent in HEP to decrease pain , increase AROM in digits and grip strength to hold objects better    Baseline no knowledge    Time 5    Period Weeks    Status New    Target Date 02/18/20      OT LONG TERM GOAL #3   Title Pt to demo 3 joint protection and AE use to increase ease and decrease pain in hands    Baseline no knowledge    Time 5    Period Weeks    Status New    Target Date 02/18/20                 Plan - 01/21/20 1104    Clinical Impression Statement Pt made progress in L hand digits flexion and grip strength - but had increase soreness in fingers on bilateral hands - done with HEP putty that her neighbour gave her - pt to hold off on putty for this week and just do HEP - moist heat, AROM and joint protection    OT Occupational Profile and History Problem Focused Assessment - Including review of records relating to presenting problem    Occupational performance deficits (Please refer to evaluation for details): ADL's;IADL's;Rest and Sleep;Play;Leisure    Body Structure / Function / Physical Skills ADL;Decreased knowledge of use of DME;ROM;UE functional use;Dexterity;Pain;Strength;IADL    Rehab Potential Fair    Clinical Decision Making Limited treatment options, no task modification necessary    Comorbidities Affecting Occupational Performance: May have comorbidities impacting occupational performance    Modification or Assistance to Complete Evaluation  No modification of tasks or assist necessary to complete eval  OT Frequency 1x / week    OT Duration 4 weeks    OT Treatment/Interventions Self-care/ADL training;Therapeutic exercise;Patient/family education;Paraffin;DME and/or AE instruction;Manual Therapy    Plan assess progress with HEP    OT  Home Exercise Plan see pt instruction           Patient will benefit from skilled therapeutic intervention in order to improve the following deficits and impairments:   Body Structure / Function / Physical Skills: ADL, Decreased knowledge of use of DME, ROM, UE functional use, Dexterity, Pain, Strength, IADL       Visit Diagnosis: Stiffness of left hand, not elsewhere classified  Stiffness of right hand, not elsewhere classified  Pain in left hand  Pain in right hand  Muscle weakness (generalized)    Problem List Patient Active Problem List   Diagnosis Date Noted  . Encounter for screening colonoscopy   . Polyp of transverse colon   . BPPV (benign paroxysmal positional vertigo) 10/11/2018  . Lumbosacral radiculopathy at S1 08/28/2018  . Neuropathy 01/18/2018  . Gastroesophageal reflux disease without esophagitis 07/18/2017  . Primary insomnia 07/18/2017  . 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 Anxiety disorder, unspecified 11/05/2015  . Major depressive disorder, single episode, unspecified 11/05/2015  . Gout, unspecified 01/01/2015  . Hypothyroidism, unspecified 01/01/2015  . Other hyperlipidemia 01/01/2015    Rosalyn Gess OTR/L,CLT 01/21/2020, 11:07 AM  [place] [address], [place], [address] Phone: [phone]   Fax:  [phone]  Name: [name] MRN: [id number] Date of Birth: [birth year]

## 2020-01-28 ENCOUNTER — Other Ambulatory Visit: Payer: Self-pay

## 2020-01-28 ENCOUNTER — Ambulatory Visit: Payer: Medicare Other | Admitting: Occupational Therapy

## 2020-01-28 DIAGNOSIS — M25641 Stiffness of right hand, not elsewhere classified: Secondary | ICD-10-CM | POA: Diagnosis not present

## 2020-01-28 DIAGNOSIS — M79641 Pain in right hand: Secondary | ICD-10-CM | POA: Diagnosis not present

## 2020-01-28 DIAGNOSIS — M6281 Muscle weakness (generalized): Secondary | ICD-10-CM | POA: Diagnosis not present

## 2020-01-28 DIAGNOSIS — M25642 Stiffness of left hand, not elsewhere classified: Secondary | ICD-10-CM | POA: Diagnosis not present

## 2020-01-28 DIAGNOSIS — M79642 Pain in left hand: Secondary | ICD-10-CM | POA: Diagnosis not present

## 2020-01-28 NOTE — Therapy (Signed)
[facility] [address], [place], [address] Phone: [phone]   Fax:  [phone]  Occupational Therapy Treatment  Patient Details  Name: [name] MRN: [id number] Date of Birth: 23-Sep-1950 Referring Provider (OT): Miguel Aschoff   Encounter Date: 01/28/2020   OT End of Session - 01/28/20 1351    Visit Number 3    Number of Visits 4    Date for OT Re-Evaluation 02/18/20    OT Start Time 1033    OT Stop Time 1114    OT Time Calculation (min) 41 min    Activity Tolerance Patient tolerated treatment well    Behavior During Therapy [facility] for tasks assessed/performed           Past Medical History:  Diagnosis Date  . Actinic keratosis   . Basal cell carcinoma 05/31/2019   left dorsal forearm  . Cancer ([place])    Meanoma,basal cell  . GERD (gastroesophageal reflux disease)   . Hypothyroidism   . Melanoma ([place])    chest    Past Surgical History:  Procedure Laterality Date  . ABDOMINAL HYSTERECTOMY    . BACK SURGERY    . BREAST CYST ASPIRATION Left    Neg  . COLONOSCOPY WITH PROPOFOL N/A 06/12/2019   Procedure: COLONOSCOPY WITH PROPOFOL;  Surgeon: Lucilla Lame, MD;  Location: [facility] ENDOSCOPY;  Service: Endoscopy;  Laterality: N/A;  . EYE SURGERY     cataracts  . FOOT SURGERY    . MELANOMA EXCISION      There were no vitals filed for this visit.   Subjective Assessment - 01/28/20 1059    Subjective  I put the putty away -and doing little better- sleeping better and not pain using my hands - just drop things at times- but think it is more the pinch grip -smaller things I drop    Pertinent History Pt with possible OA in hands with L hand worse than R - refer by her PCP for hand stiffness and pain - pt report gradually got worse - denies any sensory changes    Patient Stated Goals Want to increase my motion and decrease pain - to have more strength to make fist and drop things less     Currently in Pain? Yes    Pain Score 1     Pain Location Hand    Pain Orientation Right;Left    Pain Descriptors / Indicators Aching    Pain Type Chronic pain    Aggravating Factors  thumb CMC L              [facility] OT Assessment - 01/28/20 0001      Strength   Right Hand Grip (lbs) 36    Right Hand Lateral Pinch 14 lbs    Right Hand 3 Point Pinch 14 lbs    Left Hand Grip (lbs) 30    Left Hand Lateral Pinch 13 lbs    Left Hand 3 Point Pinch 12 lbs           assess grip and prehension strength- see flowsheet And AROM in digits- decrease PIP flexion L hand more than R - pt still have soft end to some of L hand PIP's flexion            OT Treatments/Exercises (OP) - 01/28/20 0001      RUE Paraffin   Number Minutes Paraffin 8 Minutes    RUE Paraffin Location Hand    Comments prior  to HEP review -  to decrease stiffness      LUE Paraffin   Number Minutes Paraffin 8 Minutes    LUE Paraffin Location Hand    Comments prior to review of HEP - increase ROM            soft tissue mobs done to bilateral hands prior to ROM MC spreads - and graston tool nr 2 -brushing on volar digits and palm prior to AROM  And lateral bands of PIP's  Pt to do gentle stretch for intrinsic fist at home- but not force - gentle rest in position after heat or contrast   review with pt HEP- and reinforce AROM - gentle - not to force HEP - heat in the am and then pm Tapping of digits extention 10 reps Tendon glides - blocked and pain free - 10 reps  -focus on PIP flexion  And then had pen block MC at 90 degrees in L hand during composite flexion  Opposition to all digits - keeping oval - 5 reps Had pt work on 2 and 3 point [facility] activities- with 1 cm objects - from palm to fingers , and picking up - and cards  Pt rely in the past on key grip - not 2 or 3 point pinch -   Review again with pt  joint protection and AE - avoid tight and sustained grip -and use larger  joints          OT Education - 01/28/20 1351    Education Details Review HEP - ffocus on PIP and intrinsic fist    Person(s) Educated Patient    Methods Explanation;Demonstration;Verbal cues;Tactile cues;Handout    Comprehension Verbal cues required;Returned demonstration;Verbalized understanding               OT Long Term Goals - 01/14/20 1317      OT LONG TERM GOAL #1   Title Pain on PRWHE improve with more than 12 points    Baseline pain on PRHWE at eval 22/50    Time 4    Period Weeks    Status New    Target Date 02/04/20      OT LONG TERM GOAL #2   Title Pt to be independent in HEP to decrease pain , increase AROM in digits and grip strength to hold objects better    Baseline no knowledge    Time 5    Period Weeks    Status New    Target Date 02/18/20      OT LONG TERM GOAL #3   Title Pt to demo 3 joint protection and AE use to increase ease and decrease pain in hands    Baseline no knowledge    Time 5    Period Weeks    Status New    Target Date 02/18/20                 Plan - 01/28/20 1352    Clinical Impression Statement Pt show decrease pain in bilateral hands , increase fisting from [place] but still decrease PIP flexion L hand worse than R - increase prehension - pt to focus on intrinsic fist in L hand this week -and 2-3 point pinch coordination - pt was relying on lat grip more    OT Occupational Profile and History Problem Focused Assessment - Including review of records relating to presenting problem    Occupational performance deficits (Please refer to evaluation for details): ADL's;IADL's;Rest and Sleep;Play;Leisure  Body Structure / Function / Physical Skills ADL;Decreased knowledge of use of DME;ROM;UE functional use;Dexterity;Pain;Strength;IADL    Rehab Potential Fair    Clinical Decision Making Limited treatment options, no task modification necessary    Comorbidities Affecting Occupational Performance: May have comorbidities  impacting occupational performance    Modification or Assistance to Complete Evaluation  No modification of tasks or assist necessary to complete eval    OT Frequency 1x / week    OT Duration 2 weeks    OT Treatment/Interventions Self-care/ADL training;Therapeutic exercise;Patient/family education;Paraffin;DME and/or AE instruction;Manual Therapy    Plan assess progress with HEP    OT Home Exercise Plan see pt instruction    Consulted and Agree with Plan of Care Patient           Patient will benefit from skilled therapeutic intervention in order to improve the following deficits and impairments:   Body Structure / Function / Physical Skills: ADL, Decreased knowledge of use of DME, ROM, UE functional use, Dexterity, Pain, Strength, IADL       Visit Diagnosis: Stiffness of left hand, not elsewhere classified  Stiffness of right hand, not elsewhere classified  Pain in left hand  Pain in right hand  Muscle weakness (generalized)    Problem List Patient Active Problem List   Diagnosis Date Noted  . Encounter for screening colonoscopy   . Polyp of transverse colon   . BPPV (benign paroxysmal positional vertigo) 10/11/2018  . Lumbosacral radiculopathy at S1 08/28/2018  . Neuropathy 01/18/2018  . Gastroesophageal reflux disease without esophagitis 07/18/2017  . Primary insomnia 07/18/2017  . 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 Anxiety disorder, unspecified 11/05/2015  . Major depressive disorder, single episode, unspecified 11/05/2015  . Gout, unspecified 01/01/2015  . Hypothyroidism, unspecified 01/01/2015  . Other hyperlipidemia 01/01/2015    Rosalyn Gess OTR/l,CLT 01/28/2020, 1:55 PM  [facility] [address], [place], [address] Phone: [phone]   Fax:  [phone]  Name: [name] MRN: [id number] Date of Birth: March 22, 1951

## 2020-02-04 ENCOUNTER — Ambulatory Visit: Payer: Medicare Other | Admitting: Occupational Therapy

## 2020-02-04 ENCOUNTER — Other Ambulatory Visit: Payer: Self-pay

## 2020-02-04 DIAGNOSIS — M79642 Pain in left hand: Secondary | ICD-10-CM | POA: Diagnosis not present

## 2020-02-04 DIAGNOSIS — M79641 Pain in right hand: Secondary | ICD-10-CM | POA: Diagnosis not present

## 2020-02-04 DIAGNOSIS — M6281 Muscle weakness (generalized): Secondary | ICD-10-CM | POA: Diagnosis not present

## 2020-02-04 DIAGNOSIS — M25641 Stiffness of right hand, not elsewhere classified: Secondary | ICD-10-CM | POA: Diagnosis not present

## 2020-02-04 DIAGNOSIS — M25642 Stiffness of left hand, not elsewhere classified: Secondary | ICD-10-CM

## 2020-02-04 NOTE — Therapy (Signed)
[facility] [address] [place], [address] Phone: [phone]   Fax:  [phone]  Occupational Therapy Treatment  Patient Details  Name: [name] MRN: [id number] Date of Birth: [birth year] Referring Provider (OT): Miguel Aschoff   Encounter Date: 02/04/2020   OT End of Session - 02/04/20 1610    Visit Number 4    Number of Visits 4    Date for OT Re-Evaluation 02/04/20    OT Start Time 9326    OT Stop Time 1430    OT Time Calculation (min) 42 min    Activity Tolerance Patient tolerated treatment well    Behavior During Therapy [facility] for tasks assessed/performed           Past Medical History:  Diagnosis Date  . Actinic keratosis   . Basal cell carcinoma 05/31/2019   left dorsal forearm  . Cancer ([place])    Meanoma,basal cell  . GERD (gastroesophageal reflux disease)   . Hypothyroidism   . Melanoma ([place])    chest    Past Surgical History:  Procedure Laterality Date  . ABDOMINAL HYSTERECTOMY    . BACK SURGERY    . BREAST CYST ASPIRATION Left    Neg  . COLONOSCOPY WITH PROPOFOL N/A 06/12/2019   Procedure: COLONOSCOPY WITH PROPOFOL;  Surgeon: Lucilla Lame, MD;  Location: [facility] ENDOSCOPY;  Service: Endoscopy;  Laterality: N/A;  . EYE SURGERY     cataracts  . FOOT SURGERY    . MELANOMA EXCISION      There were no vitals filed for this visit.   Subjective Assessment - 02/04/20 1608    Subjective  My hands do not feel as good as last time - but we had people this weekend and I used my hands lot more to cook and play games - and crocheting baby blanket - and then I know I should not but I push sometimes my fingers into my palm    Pertinent History Pt with possible OA in hands with L hand worse than R - refer by her PCP for hand stiffness and pain - pt report gradually got worse - denies any sensory changes    Patient Stated Goals Want to increase my motion and decrease pain - to have  more strength to make fist and drop things less    Currently in Pain? Yes    Pain Score 5    decrease in session to 0/10   Pain Location Hand    Pain Orientation Right;Left    Pain Descriptors / Indicators Aching    Pain Type Chronic pain    Pain Onset More than a month ago    Pain Frequency Intermittent              [facility] OT Assessment - 02/04/20 0001      Strength   Right Hand Grip (lbs) 36    Right Hand Lateral Pinch 14 lbs    Right Hand 3 Point Pinch 14 lbs    Left Hand Grip (lbs) 30    Left Hand Lateral Pinch 13 lbs    Left Hand 3 Point Pinch 12 lbs      Right Hand AROM   R Index  MCP 0-90 80 Degrees    R Index PIP 0-100 90 Degrees    R Long  MCP 0-90 85 Degrees    R Long PIP 0-100 100 Degrees    R Ring  MCP 0-90 100 Degrees  R Ring PIP 0-100 80 Degrees    R Little  MCP 0-90 100 Degrees    R Little PIP 0-100 90 Degrees      Left Hand AROM   L Index  MCP 0-90 90 Degrees    L Index PIP 0-100 90 Degrees    L Long  MCP 0-90 90 Degrees    L Long PIP 0-100 80 Degrees    L Ring  MCP 0-90 90 Degrees    L Ring PIP 0-100 75 Degrees   MCblock can do 80   L Little  MCP 0-90 90 Degrees    L Little PIP 0-100 95 Degrees   MCblock can do 100                   OT Treatments/Exercises (OP) - 02/04/20 0001      RUE Paraffin   Number Minutes Paraffin 8 Minutes    RUE Paraffin Location Hand    Comments decrease pain       LUE Paraffin   Number Minutes Paraffin 8 Minutes    LUE Paraffin Location Hand    Comments decrease pain              soft tissue mobs done to bilateral hands after paraffin - pain decrease to 0/10 MC spreads - and graston tool nr 2 -brushing on volar digits and palm prior to AROM And lateral bands of PIP's   review with pt HEP- and reinforce AROM - gentle - not to do PROM to force digits into palm HEP - heat in the am and then pm - winter maybe paraffin Tapping of digits extention 10 reps Tendon glides - blocked and pain free  - 10 reps  -focus on PIP flexion  And then reinforce using pen to  block MC at 90 degrees in L hand during composite flexion to put leverage to PIP  Opposition to all digits - keeping oval - 5 reps Can do at home gentle stretch for intrinsic fist at home- but not force - gentle rest in position after heat or contrast  Pt can at home work on 2 and 3 point [facility] activities- with 1 cm objects - from palm to fingers , and picking up - and cards  Pt rely in the past on key grip - not 2 or 3 point pinch -   Review again with ptjoint protection and AE - reinforce to use in combination with contrast , paraffin and HEP - avoid tight and sustained grip -and use larger joints and respect pain - linger 2 hrs or pain increase - look what done 12-24 hrs ago - change that activity or modify  Pt has hand out          OT Education - 02/04/20 1610    Education Details Review HEP - focus on intrinsic fist and block MC for composite - AROM only    Person(s) Educated Patient    Methods Explanation;Demonstration;Verbal cues;Tactile cues;Handout    Comprehension Verbal cues required;Returned demonstration;Verbalized understanding               OT Long Term Goals - 02/04/20 1611      OT LONG TERM GOAL #1   Title Pain on PRWHE improve with more than 12 points    Baseline pain on PRHWE at eval 22/50 and now 8/50    Status Achieved      OT LONG TERM GOAL #2   Title Pt to be independent  in HEP to decrease pain , increase AROM in digits and grip strength to hold objects better    Baseline strength same - pt has knowledge on joint protection and HEP  - showed increase AROM    Status Partially Met      OT LONG TERM GOAL #3   Title Pt to demo 3 joint protection and AE use to increase ease and decrease pain in hands    Status Achieved                 Plan - 02/04/20 1611    OT Occupational Profile and History Problem Focused Assessment - Including review of records relating to presenting  problem    Occupational performance deficits (Please refer to evaluation for details): ADL's;IADL's;Rest and Sleep;Play;Leisure    Body Structure / Function / Physical Skills ADL;Decreased knowledge of use of DME;ROM;UE functional use;Dexterity;Pain;Strength;IADL    Rehab Potential Fair    Clinical Decision Making Limited treatment options, no task modification necessary    Comorbidities Affecting Occupational Performance: May have comorbidities impacting occupational performance    Modification or Assistance to Complete Evaluation  No modification of tasks or assist necessary to complete eval    OT Treatment/Interventions Self-care/ADL training;Therapeutic exercise;Patient/family education;Paraffin;DME and/or AE instruction;Manual Therapy    Plan cont homeprogram    OT Home Exercise Plan see pt instruction    Consulted and Agree with Plan of Care Patient           Patient will benefit from skilled therapeutic intervention in order to improve the following deficits and impairments:   Body Structure / Function / Physical Skills: ADL, Decreased knowledge of use of DME, ROM, UE functional use, Dexterity, Pain, Strength, IADL       Visit Diagnosis: Stiffness of left hand, not elsewhere classified  Stiffness of right hand, not elsewhere classified  Pain in left hand  Pain in right hand  Muscle weakness (generalized)    Problem List Patient Active Problem List   Diagnosis Date Noted  . Encounter for screening colonoscopy   . Polyp of transverse colon   . BPPV (benign paroxysmal positional vertigo) 10/11/2018  . Lumbosacral radiculopathy at S1 08/28/2018  . Neuropathy 01/18/2018  . Gastroesophageal reflux disease without esophagitis 07/18/2017  . Primary insomnia 07/18/2017  . 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 Anxiety disorder, unspecified 11/05/2015  . Major depressive disorder, single episode, unspecified 11/05/2015  . Gout, unspecified 01/01/2015  .  Hypothyroidism, unspecified 01/01/2015  . Other hyperlipidemia 01/01/2015    Rosalyn Gess OTR/L,CLT 02/04/2020, 4:14 PM  [facility] [address], [place], [address] Phone: [phone]   Fax:  [phone]  Name: [name] MRN: [id number] Date of Birth: 06-Oct-1950

## 2020-02-06 ENCOUNTER — Ambulatory Visit: Payer: Medicare Other | Admitting: Orthotics

## 2020-02-06 ENCOUNTER — Other Ambulatory Visit: Payer: Self-pay

## 2020-02-06 DIAGNOSIS — Q667 Congenital pes cavus, unspecified foot: Secondary | ICD-10-CM

## 2020-02-06 DIAGNOSIS — G629 Polyneuropathy, unspecified: Secondary | ICD-10-CM

## 2020-02-06 NOTE — Progress Notes (Signed)
Cast to day for CMFO to take pressue off painful karatomas b/l  R>L.   Plan of offlloading 2nd b/l with small dispersion pad proximal; hug arch to address pes cavuus foot type.

## 2020-02-14 ENCOUNTER — Encounter: Payer: Medicare Other | Admitting: Dermatology

## 2020-03-04 NOTE — Progress Notes (Signed)
I,[name],acting as a scribe for [name] Durie, MD.,have documented all relevant documentation on the behalf of [name] Durie, MD,as directed by  [name] Durie, MD while in the presence of [name] Durie, MD.   Established patient visit   Patient: [name]   DOB: [birth year]   69 y.o. Female  MRN: [id number] Visit Date: 03/10/2020  Today's healthcare provider: [name], MD   Chief Complaint  Patient presents with  . Follow-up  . Hypertension   Subjective    HPI  Patient is feeling better than her last visit.  She is seeing occupational therapy for her hands and it has helped for OA pain.  It is really helped the stiffness.  She sees podiatry.  She also is found that she has a tailbone issue with her pain and knows her is not much she can do about it other than heating pad.  Maybe you some ice. Hypertension, follow-up  BP Readings from Last 3 Encounters:  03/10/20 102/65  12/31/19 107/70  10/01/19 111/69   Wt Readings from Last 3 Encounters:  03/10/20 175 lb (79.4 kg)  12/31/19 174 lb (78.9 kg)  10/01/19 179 lb (81.2 kg)     She was last seen for hypertension 2 months ago.  BP at that visit was 107/70. Management since that visit includes; controlled on Dyazide. She reports good compliance with treatment. She is not having side effects. none She is exercising. She is adherent to low salt diet.   Outside blood pressures are not checking.  She does not smoke.  Use of agents associated with hypertension: none.   --------------------------------------------------------------------  Neuropathy From 12/31/2019-Increased gabapentin from 100 to 300 mg at bedtime.  Chronic mid back pain From 12/31/2019-This is in the right lower thoracic region.  Not exertional.  It is not cardiac.  Obtain x-rays of T-spine and LS-spine and a general abdominal ultrasound.  Primary osteoarthritis, left hand From 12/31/2019-Referred to  occupational therapy to help with her range of motion.  May need referral to surgical specialists of hand surgery  Benign paroxysmal positional vertigo, unspecified laterality From 12/31/2019-May need ENT or neurology referral but I think this is a benign process at this time.      Medications: Outpatient Medications Prior to Visit  Medication Sig  . allopurinol (ZYLOPRIM) 300 MG tablet Take 1 tablet (300 mg total) by mouth daily.  . Ascorbic Acid (VITAMIN C) 500 MG CHEW Chew 2 tablets by mouth daily.  Marland Kitchen aspirin EC 81 MG tablet Take 1 tablet by mouth daily.  . Biotin (SUPER BIOTIN) 5 MG TABS Take 1 tablet by mouth daily.  . Cholecalciferol (VITAMIN D-1000 MAX ST) 25 MCG (1000 UT) tablet Take 1 tablet by mouth daily.  Marland Kitchen gabapentin (NEURONTIN) 300 MG capsule Take 1 capsule (300 mg total) by mouth at bedtime.  Marland Kitchen levothyroxine (SYNTHROID) 75 MCG tablet Take 1 tablet (75 mcg total) by mouth daily.  . Multiple Vitamins-Minerals (ZINC PO) Take by mouth.  . Naproxen Sodium 220 MG CAPS Take 1 tablet by mouth at bedtime as needed.  Marland Kitchen omeprazole (PRILOSEC) 20 MG capsule Take 1 capsule by mouth daily.  . pseudoephedrine (SUDAFED) 30 MG tablet Take 15 mg by mouth in the morning and at bedtime.  . triamterene-hydrochlorothiazide (DYAZIDE) 37.5-25 MG capsule Take 1 each (1 capsule total) by mouth daily.  . Zinc 30 MG TABS Take 1 tablet by mouth daily.  Marland Kitchen zolpidem (AMBIEN) 5 MG tablet TAKE 1 TABLET(5 MG)  BY MOUTH AT BEDTIME AS NEEDED  . simvastatin (ZOCOR) 40 MG tablet Take 1 tablet (40 mg total) by mouth daily.   No facility-administered medications prior to visit.    Review of Systems  Constitutional: Negative for appetite change, chills, fatigue and fever.  Respiratory: Negative for chest tightness and shortness of breath.   Cardiovascular: Negative for chest pain and palpitations.  Gastrointestinal: Negative for abdominal pain, nausea and vomiting.  Neurological: Negative for dizziness and  weakness.      Objective    BP 102/65 (BP Location: Left Arm, Patient Position: Sitting, Cuff Size: Large)   Pulse 81   Temp 98.4 F (36.9 C) (Oral)   Resp 18   Ht 5\' 5"  (1.651 m)   Wt 175 lb (79.4 kg)   LMP  (LMP Unknown) Comment: 1980s  SpO2 97%   BMI 29.12 kg/m    Physical Exam Vitals reviewed.  Constitutional:      Appearance: Normal appearance.  HENT:     Head: Normocephalic and atraumatic.     Right Ear: Tympanic membrane, ear canal and external ear normal.     Left Ear: Tympanic membrane, ear canal and external ear normal.     Nose: Nose normal.  Eyes:     General: No scleral icterus.    Conjunctiva/sclera: Conjunctivae normal.  Cardiovascular:     Rate and Rhythm: Normal rate and regular rhythm.     Heart sounds: Normal heart sounds.  Pulmonary:     Effort: Pulmonary effort is normal.     Breath sounds: Normal breath sounds.  Abdominal:     Palpations: Abdomen is soft.     Tenderness: There is no abdominal tenderness. There is no right CVA tenderness or left CVA tenderness.  Musculoskeletal:     Right hand: Decreased range of motion.     Left hand: Decreased range of motion.     Comments: Callus on both feet. OA changes of hands.  Skin:    General: Skin is warm and dry.  Neurological:     General: No focal deficit present.     Mental Status: She is alert and oriented to person, place, and time.  Psychiatric:        Mood and Affect: Mood normal.        Behavior: Behavior normal.        Thought Content: Thought content normal.        Judgment: Judgment normal.     BP 102/65 (BP Location: Left Arm, Patient Position: Sitting, Cuff Size: Large)   Pulse 81   Temp 98.4 F (36.9 C) (Oral)   Resp 18   Ht 5\' 5"  (1.651 m)   Wt 175 lb (79.4 kg)   LMP  (LMP Unknown) Comment: 1980s  SpO2 97%   BMI 29.12 kg/m      No results found for any visits on 03/10/20.  Assessment & Plan     1. Hypertension, unspecified type On Dyazide  2. Need for  influenza vaccination  - Flu Vaccine QUAD High Dose(Fluad)  3. Gastroesophageal reflux disease without esophagitis On omeprazole  4. Lumbosacral radiculopathy at S1   5. Neuropathy Improved on higher dose gabapentin  6. Primary insomnia Watch zolpidem use and 69 year old  7. Other hyperlipidemia   8. Primary osteoarthritis, left hand Improved with occupational therapy    Return in 7 months (on 10/02/2020) for AWV.         Richard Cranford Mon, MD  [facility] [phone] (phone) [phone] (  fax)  [place]

## 2020-03-05 ENCOUNTER — Other Ambulatory Visit: Payer: Self-pay

## 2020-03-05 ENCOUNTER — Ambulatory Visit (INDEPENDENT_AMBULATORY_CARE_PROVIDER_SITE_OTHER): Payer: Medicare Other | Admitting: Orthotics

## 2020-03-05 DIAGNOSIS — G629 Polyneuropathy, unspecified: Secondary | ICD-10-CM

## 2020-03-05 DIAGNOSIS — Q667 Congenital pes cavus, unspecified foot: Secondary | ICD-10-CM

## 2020-03-05 NOTE — Progress Notes (Signed)
F/o has to be redone.   She wants them to fit into sketchers.  I explained they will lose some functionality if I have to reduce offloads, change material to get them to fit.

## 2020-03-10 ENCOUNTER — Other Ambulatory Visit: Payer: Self-pay

## 2020-03-10 ENCOUNTER — Encounter: Payer: Self-pay | Admitting: Family Medicine

## 2020-03-10 ENCOUNTER — Ambulatory Visit (INDEPENDENT_AMBULATORY_CARE_PROVIDER_SITE_OTHER): Payer: Medicare Other | Admitting: Family Medicine

## 2020-03-10 VITALS — BP 102/65 | HR 81 | Temp 98.4°F | Resp 18 | Ht 65.0 in | Wt 175.0 lb

## 2020-03-10 DIAGNOSIS — E7849 Other hyperlipidemia: Secondary | ICD-10-CM

## 2020-03-10 DIAGNOSIS — I10 Essential (primary) hypertension: Secondary | ICD-10-CM | POA: Diagnosis not present

## 2020-03-10 DIAGNOSIS — M5417 Radiculopathy, lumbosacral region: Secondary | ICD-10-CM

## 2020-03-10 DIAGNOSIS — Z23 Encounter for immunization: Secondary | ICD-10-CM | POA: Diagnosis not present

## 2020-03-10 DIAGNOSIS — K219 Gastro-esophageal reflux disease without esophagitis: Secondary | ICD-10-CM | POA: Diagnosis not present

## 2020-03-10 DIAGNOSIS — G629 Polyneuropathy, unspecified: Secondary | ICD-10-CM

## 2020-03-10 DIAGNOSIS — M19042 Primary osteoarthritis, left hand: Secondary | ICD-10-CM

## 2020-03-10 DIAGNOSIS — F5101 Primary insomnia: Secondary | ICD-10-CM

## 2020-04-09 ENCOUNTER — Encounter: Payer: Self-pay | Admitting: Family Medicine

## 2020-04-10 ENCOUNTER — Telehealth: Payer: Self-pay

## 2020-04-10 NOTE — Telephone Encounter (Signed)
LVM for pt to call back to schedule an appt for orthotic pick up.

## 2020-04-11 ENCOUNTER — Other Ambulatory Visit: Payer: Self-pay | Admitting: *Deleted

## 2020-04-11 DIAGNOSIS — G629 Polyneuropathy, unspecified: Secondary | ICD-10-CM

## 2020-04-11 MED ORDER — GABAPENTIN 100 MG PO CAPS: 100.0000 mg | ORAL_CAPSULE | Freq: Every day | ORAL | 1 refills | Status: DC

## 2020-04-11 NOTE — Telephone Encounter (Signed)
Done

## 2020-04-15 ENCOUNTER — Other Ambulatory Visit: Payer: Self-pay | Admitting: Family Medicine

## 2020-04-15 DIAGNOSIS — E7849 Other hyperlipidemia: Secondary | ICD-10-CM

## 2020-04-15 NOTE — Telephone Encounter (Signed)
Requested Prescriptions  Pending Prescriptions Disp Refills   simvastatin (ZOCOR) 40 MG tablet [Pharmacy Med Name: SIMVASTATIN 40MG  TABLETS] 90 tablet 3    Sig: TAKE 1 TABLET(40 MG) BY MOUTH DAILY     Cardiovascular:  Antilipid - Statins Failed - 04/15/2020 10:35 AM      Failed - LDL in normal range and within 360 days    LDL Chol Calc (NIH)  Date Value Ref Range Status  12/31/2019 106 (H) 0 - 99 mg/dL Final         Failed - Triglycerides in normal range and within 360 days    Triglycerides  Date Value Ref Range Status  12/31/2019 244 (H) 0 - 149 mg/dL Final         Passed - Total Cholesterol in normal range and within 360 days    Cholesterol, Total  Date Value Ref Range Status  12/31/2019 189 100 - 199 mg/dL Final         Passed - HDL in normal range and within 360 days    HDL  Date Value Ref Range Status  12/31/2019 41 >39 mg/dL Final         Passed - Patient is not pregnant      Passed - Valid encounter within last 12 months    Recent Outpatient Visits          1 month ago Hypertension, unspecified type   [facility] Jerrol Banana., MD   3 months ago Neuropathy   [facility] Jerrol Banana., MD   6 months ago Encounter for annual wellness visit (AWV) in Medicare patient   [facility] Jerrol Banana., MD   1 year ago Hypertension, unspecified type   [facility] Jerrol Banana., MD      Future Appointments            In 1 week [facility], [place], MD [place]

## 2020-04-24 ENCOUNTER — Other Ambulatory Visit: Payer: Self-pay

## 2020-04-24 ENCOUNTER — Ambulatory Visit: Payer: Medicare Other | Admitting: Dermatology

## 2020-04-24 DIAGNOSIS — B079 Viral wart, unspecified: Secondary | ICD-10-CM

## 2020-04-24 DIAGNOSIS — C44729 Squamous cell carcinoma of skin of left lower limb, including hip: Secondary | ICD-10-CM

## 2020-04-24 DIAGNOSIS — L905 Scar conditions and fibrosis of skin: Secondary | ICD-10-CM | POA: Diagnosis not present

## 2020-04-24 DIAGNOSIS — D2372 Other benign neoplasm of skin of left lower limb, including hip: Secondary | ICD-10-CM

## 2020-04-24 DIAGNOSIS — L570 Actinic keratosis: Secondary | ICD-10-CM

## 2020-04-24 DIAGNOSIS — L820 Inflamed seborrheic keratosis: Secondary | ICD-10-CM

## 2020-04-24 DIAGNOSIS — D229 Melanocytic nevi, unspecified: Secondary | ICD-10-CM

## 2020-04-24 DIAGNOSIS — D2262 Melanocytic nevi of left upper limb, including shoulder: Secondary | ICD-10-CM | POA: Diagnosis not present

## 2020-04-24 DIAGNOSIS — D485 Neoplasm of uncertain behavior of skin: Secondary | ICD-10-CM

## 2020-04-24 DIAGNOSIS — Z85828 Personal history of other malignant neoplasm of skin: Secondary | ICD-10-CM

## 2020-04-24 DIAGNOSIS — L814 Other melanin hyperpigmentation: Secondary | ICD-10-CM

## 2020-04-24 DIAGNOSIS — L821 Other seborrheic keratosis: Secondary | ICD-10-CM

## 2020-04-24 DIAGNOSIS — D1800 Hemangioma unspecified site: Secondary | ICD-10-CM

## 2020-04-24 DIAGNOSIS — Z1283 Encounter for screening for malignant neoplasm of skin: Secondary | ICD-10-CM | POA: Diagnosis not present

## 2020-04-24 DIAGNOSIS — L578 Other skin changes due to chronic exposure to nonionizing radiation: Secondary | ICD-10-CM

## 2020-04-24 DIAGNOSIS — D239 Other benign neoplasm of skin, unspecified: Secondary | ICD-10-CM

## 2020-04-24 HISTORY — DX: Squamous cell carcinoma of skin of left lower limb, including hip: C44.729

## 2020-04-24 MED ORDER — MUPIROCIN 2 % EX OINT: 1.0000 "application " | TOPICAL_OINTMENT | Freq: Every day | CUTANEOUS | 0 refills | Status: DC

## 2020-04-24 NOTE — Progress Notes (Signed)
Follow-Up Visit   Subjective  [name] is a 69 y.o. female who presents for the following: FBSE.  Patient here for full body skin exam and skin cancer screening. Patient with history of melanoma (2015), BCC and AK's. There is a spot at her back that has been dry and itchy that she noticed a few weeks ago. She also has some spots under her bra that are irritatedt and one at leg that is not healing. She thinks she may have cut it while shaving.   The following portions of the chart were reviewed this encounter and updated as appropriate:  Tobacco  Allergies  Meds  Problems  Med Hx  Surg Hx  Fam Hx      Review of Systems:  No other skin or systemic complaints except as noted in HPI or Assessment and Plan.  Objective  Well appearing patient in no apparent distress; mood and affect are within normal limits.  A full examination was performed including scalp, head, eyes, ears, nose, lips, neck, chest, axillae, abdomen, back, buttocks, bilateral upper extremities, bilateral lower extremities, hands, feet, fingers, toes, fingernails, and toenails. All findings within normal limits unless otherwise noted below.  Objective  Left medial calf: Left medial calf 1.3cm scaly pink plaque  Objective  R pretibia x 1, R upper arm x 1, L upper chest x 1, R dorsal hand x 2, L dorsal hand x 2, R forehead x 1 (8): Erythematous thin papules/macules with gritty scale.   Objective  Left Shoulder - Posterior: Firm pink/brown papulenodule with dimple sign.   Objective  Left Ventral Forearm: 0.25cm dark brown macule  Objective  Left upper chest: Well healed scar  Objective  Left Inframammary x 1, R upper back x 1 (2): Erythematous keratotic or waxy stuck-on papule or plaque.   Objective  R preauricular: Verrucous papules    Assessment & Plan  Neoplasm of uncertain behavior of skin Left medial calf  Skin / nail biopsy Type of biopsy: tangential   Informed consent:  discussed and consent obtained   Timeout: patient name, date of birth, surgical site, and procedure verified   Patient was prepped and draped in usual sterile fashion: Area prepped with isopropyl alcohol. Anesthesia: the lesion was anesthetized in a standard fashion   Anesthetic:  1% lidocaine w/ epinephrine 1-100,000 buffered w/ 8.4% NaHCO3 Instrument used: flexible razor blade   Hemostasis achieved with: aluminum chloride   Outcome: patient tolerated procedure well   Post-procedure details: wound care instructions given   Additional details:  Mupirocin and a bandage applied  mupirocin ointment (BACTROBAN) 2 %  Specimen 1 - Surgical pathology Differential Diagnosis:  Check Margins: No R/o SCC Left medial calf 1.3cm scaly pink plaque  R/o SCC  AK (actinic keratosis) (8) R pretibia x 1, R upper arm x 1, L upper chest x 1, R dorsal hand x 2, L dorsal hand x 2, R forehead x 1  Prior to procedure, discussed risks of blister formation, small wound, skin dyspigmentation, or rare scar following cryotherapy.    Destruction of lesion - R pretibia x 1, R upper arm x 1, L upper chest x 1, R dorsal hand x 2, L dorsal hand x 2, R forehead x 1 Complexity: simple   Destruction method: cryotherapy   Informed consent: discussed and consent obtained   Lesion destroyed using liquid nitrogen: Yes   Cryotherapy cycles:  2 Outcome: patient tolerated procedure well with no complications   Post-procedure details: wound care instructions  given    Dermatofibroma Left Shoulder - Posterior  Benign-appearing.  Observation.  Call clinic for new or changing lesions.  Recommend daily use of broad spectrum spf 30+ sunscreen to sun-exposed areas.    Nevus Left Ventral Forearm  Benign-appearing.  Observation.  Call clinic for new or changing lesions.  Recommend daily use of broad spectrum spf 30+ sunscreen to sun-exposed areas.   Recheck on follow up.  Scar Left upper chest  History of  melanoma  Inflamed seborrheic keratosis (2) Left Inframammary x 1, R upper back x 1  Prior to procedure, discussed risks of blister formation, small wound, skin dyspigmentation, or rare scar following cryotherapy.    Destruction of lesion - Left Inframammary x 1, R upper back x 1 Complexity: simple   Destruction method: cryotherapy   Informed consent: discussed and consent obtained   Lesion destroyed using liquid nitrogen: Yes   Cryotherapy cycles:  2 Outcome: patient tolerated procedure well with no complications   Post-procedure details: wound care instructions given    Viral warts, unspecified type R preauricular  Discussed viral etiology and risk of spread.  Discussed multiple treatments may be required to clear warts.  Discussed possible post-treatment dyspigmentation and risk of recurrence.  Prior to procedure, discussed risks of blister formation, small wound, skin dyspigmentation, or rare scar following cryotherapy.    Destruction of lesion - R preauricular Complexity: simple   Destruction method: cryotherapy   Informed consent: discussed and consent obtained   Lesion destroyed using liquid nitrogen: Yes   Cryotherapy cycles:  2 Outcome: patient tolerated procedure well with no complications   Post-procedure details: wound care instructions given     Lentigines - Scattered tan macules - Discussed due to sun exposure - Benign, observe - Call for any changes  Seborrheic Keratoses - Stuck-on, waxy, tan-brown papules and plaques  - Discussed benign etiology and prognosis. - Observe - Call for any changes  Melanocytic Nevi - Tan-brown and/or pink-flesh-colored symmetric macules and papules - Benign appearing on exam today - Observation - Call clinic for new or changing moles - Recommend daily use of broad spectrum spf 30+ sunscreen to sun-exposed areas.   Hemangiomas - Red papules - Discussed benign nature - Observe - Call for any changes  Actinic  Damage - Chronic, secondary to cumulative UV/sun exposure - diffuse scaly erythematous macules with underlying dyspigmentation - Recommend daily broad spectrum sunscreen SPF 30+ to sun-exposed areas, reapply every 2 hours as needed.  - Call for new or changing lesions. - Severe, chronic - Discussed "Field Treatment" for Severe, Confluent Actinic Changes with Pre-Cancerous Actinic Keratoses due to cumulative sun exposure/UV radiation exposure over time Field treatment involves treatment of an entire area of skin that has confluent Actinic Changes (Sun/ Ultraviolet light damage) and PreCancerous Actinic Keratoses by method of PhotoDynamic Therapy (PDT) and/or prescription Topical Chemotherapy agents such as 5-fluorouracil, 5-fluorouracil/calcipotriene, and/or imiquimod.  The purpose is to decrease the number of clinically evident and subclinical PreCancerous lesions to prevent progression to development of skin cancer by chemically destroying early precancer changes that may or may not be visible.  It has been shown to reduce the risk of developing skin cancer in the treated area. As a result of treatment, redness, scaling, crusting, and open sores may occur during treatment course. One or more than one of these methods may be used and may have to be used several times to control, suppress and eliminate the PreCancerous changes. Discussed treatment course, expected reaction, and possible side  effects. - Will plan PDT vs 5FU/calcipotriene to hands and forearms in January and recheck in February. She will let us know which she prefers.   Skin cancer screening performed today.  History of Basal Cell Carcinoma of the Skin - No evidence of recurrence today at left dorsal forearm - Recommend regular full body skin exams - Recommend daily broad spectrum sunscreen SPF 30+ to sun-exposed areas, reapply every 2 hours as needed.  - Call if any new or changing lesions are noted between office visits  Return in  about 2 months (around 06/24/2020) for 1-2 month AK/ISK follow up, 6 month TBSE.  [name], RMA, am acting as scribe for Forest Gleason, MD .  Documentation: I have reviewed the above documentation for accuracy and completeness, and I agree with the above.  Forest Gleason, MD

## 2020-04-24 NOTE — Patient Instructions (Addendum)
Melanoma ABCDEs  Melanoma is the most dangerous type of skin cancer, and is the leading cause of death from skin disease.  You are more likely to develop melanoma if you:  Have light-colored skin, light-colored eyes, or red or blond hair  Spend a lot of time in the sun  Tan regularly, either outdoors or in a tanning bed  Have had blistering sunburns, especially during childhood  Have a close family member who has had a melanoma  Have atypical moles or large birthmarks  Early detection of melanoma is key since treatment is typically straightforward and cure rates are extremely high if we catch it early.   The first sign of melanoma is often a change in a mole or a new dark spot.  The ABCDE system is a way of remembering the signs of melanoma.  A for asymmetry:  The two halves do not match. B for border:  The edges of the growth are irregular. C for color:  A mixture of colors are present instead of an even brown color. D for diameter:  Melanomas are usually (but not always) greater than 84mm - the size of a pencil eraser. E for evolution:  The spot keeps changing in size, shape, and color.  Please check your skin once per month between visits. You can use a small mirror in front and a large mirror behind you to keep an eye on the back side or your body.   If you see any new or changing lesions before your next follow-up, please call to schedule a visit.  Please continue daily skin protection including broad spectrum sunscreen SPF 30+ to sun-exposed areas, reapplying every 2 hours as needed when you're outdoors.   Wound Care Instructions  1. Cleanse wound gently with soap and water once a day then pat dry with clean gauze. Apply a thing coat of Petrolatum (petroleum jelly, "Vaseline") over the wound (unless you have an allergy to this). We recommend that you use a new, sterile tube of Vaseline. Do not pick or remove scabs. Do not remove the yellow or white "healing tissue" from the  base of the wound.  2. Cover the wound with fresh, clean, nonstick gauze and secure with paper tape. You may use Band-Aids in place of gauze and tape if the would is small enough, but would recommend trimming much of the tape off as there is often too much. Sometimes Band-Aids can irritate the skin.  3. You should call the office for your biopsy report after 1 week if you have not already been contacted.  4. If you experience any problems, such as abnormal amounts of bleeding, swelling, significant bruising, significant pain, or evidence of infection, please call the office immediately.  5. FOR ADULT SURGERY PATIENTS: If you need something for pain relief you may take 1 extra strength Tylenol (acetaminophen) AND 2 Ibuprofen (200mg  each) together every 4 hours as needed for pain. (do not take these if you are allergic to them or if you have a reason you should not take them.) Typically, you may only need pain medication for 1 to 3 days.    Cryotherapy Aftercare  . Wash gently with soap and water everyday.   Marland Kitchen Apply Vaseline and Band-Aid daily until healed.  Prior to procedure, discussed risks of blister formation, small wound, skin dyspigmentation, or rare scar following cryotherapy.

## 2020-04-30 NOTE — Progress Notes (Signed)
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   MAs please schedule for excision next available within the next 2 months. Thank you!

## 2020-05-12 ENCOUNTER — Encounter: Payer: Self-pay | Admitting: Dermatology

## 2020-05-22 ENCOUNTER — Telehealth: Payer: Self-pay | Admitting: Podiatry

## 2020-05-22 NOTE — Telephone Encounter (Signed)
Pt left message stating she received a bill for 438.00 for the orthotics and she said they did not work and she does not want them.She stated she doesn't think she ever needed them. She is not going to pay for them and wants the charges to be taken out.

## 2020-05-23 NOTE — Telephone Encounter (Signed)
She can return those pair of orthotics and we can forgive the payment

## 2020-05-27 ENCOUNTER — Ambulatory Visit (INDEPENDENT_AMBULATORY_CARE_PROVIDER_SITE_OTHER): Payer: Medicare Other | Admitting: Dermatology

## 2020-05-27 ENCOUNTER — Other Ambulatory Visit: Payer: Self-pay

## 2020-05-27 DIAGNOSIS — L988 Other specified disorders of the skin and subcutaneous tissue: Secondary | ICD-10-CM | POA: Diagnosis not present

## 2020-05-27 DIAGNOSIS — C44729 Squamous cell carcinoma of skin of left lower limb, including hip: Secondary | ICD-10-CM | POA: Diagnosis not present

## 2020-05-27 DIAGNOSIS — C4492 Squamous cell carcinoma of skin, unspecified: Secondary | ICD-10-CM

## 2020-05-27 MED ORDER — TRAMADOL HCL 50 MG PO TABS
50.0000 mg | ORAL_TABLET | Freq: Four times a day (QID) | ORAL | 0 refills | Status: AC | PRN
Start: 2020-05-27 — End: 2020-06-03

## 2020-05-27 MED ORDER — MUPIROCIN 2 % EX OINT: 1.0000 "application " | TOPICAL_OINTMENT | Freq: Every day | CUTANEOUS | 0 refills | Status: AC

## 2020-05-27 MED ORDER — CEPHALEXIN 500 MG PO CAPS: 500.0000 mg | ORAL_CAPSULE | Freq: Two times a day (BID) | ORAL | 0 refills | Status: AC

## 2020-05-27 NOTE — Patient Instructions (Signed)

## 2020-05-27 NOTE — Progress Notes (Addendum)
Follow-Up Visit   Subjective  [name] is a 69 y.o. 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 Her husband is currently admitted to [facility] for infection and so she reports she has a lot on her plate at this time.   Prior to surgery, discussed at length option of ED&C vs excision including approximately 85% cure rate with ED&C procedure and lower risk of complications as well as no activity restrictions vs approximately 92% cure rate with excision but with higher risk for bleeding, infection or other complications.  Discussed that location on leg increases risk of slow wound healing. Pt prefers excision for higher cure rate.   The following portions of the chart were reviewed this encounter and updated as appropriate:   Tobacco  Allergies  Meds  Problems  Med Hx  Surg Hx  Fam Hx      Review of Systems:  No other skin or systemic complaints except as noted in HPI or Assessment and Plan.  Objective  Well appearing patient in no apparent distress; mood and affect are within normal limits.  A focused examination was performed including the left lower leg. Relevant physical exam findings are noted in the Assessment and Plan.  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  Skin excision  Lesion length (cm):  1.2 Margin per side (cm):  0.5 Total excision diameter (cm):  2.2 Informed consent: discussed and consent obtained   Timeout: patient name, date of birth, surgical site, and procedure verified   Procedure prep:  Patient was prepped and draped in usual sterile fashion Prep type:  Isopropyl alcohol and povidone-iodine Anesthesia: the lesion was anesthetized in a standard fashion   Anesthetic:  1% lidocaine w/ epinephrine 1-100,000 buffered w/ 8.4% NaHCO3 (20  cc) Instrument used: #15 blade   Instrument used comment:  15c Hemostasis achieved with: pressure   Hemostasis achieved with comment:  Electrocautery Outcome: patient tolerated procedure well with no complications   Post-procedure details: sterile dressing applied and wound care instructions given   Dressing type: bandage and pressure dressing    Skin repair Complexity:  Complex Final length (cm):  7.6 Informed consent: discussed and consent obtained   Timeout: patient name, date of birth, surgical site, and procedure verified   Procedure prep:  Patient was prepped and draped in usual sterile fashion Prep type:  Povidone-iodine Anesthesia: the lesion was anesthetized in a standard fashion   Anesthetic:  1% lidocaine w/ epinephrine 1-100,000 local infiltration Reason for type of repair: reduce tension to allow closure, reduce the risk of dehiscence, infection, and necrosis, allow closure of the large defect, allow side-to-side closure without requiring a flap or graft and enhance both functionality and cosmetic results   Undermining: area extensively undermined   Subcutaneous layers (deep stitches):  Suture size:  3-0 Suture type: Vicryl (polyglactin 910)   Fine/surface layer approximation (top stitches):  Suture size:  4-0 Suture type: Prolene (polypropylene)   Stitches: simple running   Suture removal (days):  14 Hemostasis achieved with: suture, pressure and electrodesiccation Outcome: patient tolerated procedure well with no complications   Post-procedure details: sterile dressing applied and wound care instructions given   Dressing type: bandage and pressure dressing   Additional details:  Extensive undermining greater than the maximum width of  the defect along at least one entire edge of the defect was performed Maximum width of defect perpendicular to line of the closure 2.1 cm Width of undermining done 4.2 cm but still with unusually poor mobility of surrounding skin.  Discussed option of leaving a central area open to heal by secondary intention but patient was very against leaving an open area.  Mupirocin and a pressure dressing applied  Specimen 1 - Surgical pathology Differential Diagnosis: r/o Residual SCC/Bx proven SCC Check Margins: yes [phone]  Return in about 2 weeks (around 06/10/2020) for s/r.  [name], RMA, am acting as scribe for Forest Gleason, MD .  Documentation: I have reviewed the above documentation for accuracy and completeness, and I agree with the above.  Forest Gleason, MD

## 2020-05-28 ENCOUNTER — Telehealth: Payer: Self-pay

## 2020-05-28 NOTE — Telephone Encounter (Signed)
I attempted to call the patient to inform her that she can return the orthotics, per Dr. Posey Pronto.  Jocelyn Lamer is going to delete the charge for the orthotics.

## 2020-05-28 NOTE — Telephone Encounter (Signed)
Patient doing well after yesterday's surgery, just feels a "tight". Advised patient to call office with any concerns, JS

## 2020-05-29 NOTE — Telephone Encounter (Signed)
"  I'm returning your call."  I am calling you in regards to the orthotic charge.  You can return the orthotics and we will cancel the charge.  "I never got them.  I just know my arches are too high and they're not going to fit in my shoes properly.  So, I don't want them."  We'll delete the charge.  "Thank you so much."  (Orthotics are at the [place] office.  Will give them to [facility].)

## 2020-05-30 ENCOUNTER — Other Ambulatory Visit: Payer: Self-pay | Admitting: Family Medicine

## 2020-05-31 ENCOUNTER — Encounter: Payer: Self-pay | Admitting: Dermatology

## 2020-05-31 ENCOUNTER — Telehealth: Payer: Self-pay | Admitting: Dermatology

## 2020-05-31 MED ORDER — DOXYCYCLINE HYCLATE 100 MG PO TABS: 100.0000 mg | ORAL_TABLET | Freq: Two times a day (BID) | ORAL | 1 refills | Status: AC

## 2020-05-31 NOTE — Telephone Encounter (Signed)
Spoke with patient.  She reports that she has noticed some blistering around her surgical site since yesterday.  She notes minimal pain and denies itch but was concerned about the appearance with the blistering. Review of photos reveals incision site clean, dry and intact with some mild surrounding erythema and vesicles nearby but not directly adjacent to the incision.  Favor vesicles likely "edema bullae" due to tension on skin and pressure at the site. Recommend adding an Ace wrap for compression first thing in the morning and wear all day to reduce swelling in this leg and reduce tension at wound area. Will also discontinue cephalexin and start doxycycline 100 mg twice a day. Doxycycline sent to pharmacy.  Doxycycline should be taken with food to prevent nausea. Do not lay down for 30 minutes after taking. Be cautious with sun exposure and use good sun protection while on this medication.  All questions answered. [name] advised to please page if she has any other concerns this weekend.  MAs please call and schedule patient for follow-up wound check on Tuesday - 12:30 pm is ok if we don't have an opening. Thank you!    If she is concerned the area is worsening, she will call for an appointment Monday instead with Dr. Nicole Kindred or Dr. Nehemiah Massed to check the site.

## 2020-06-02 NOTE — Progress Notes (Signed)
Skin (M), left medial calf NO RESIDUAL SQUAMOUS CELL CARCINOMA, MARGINS FREE  Entire lesion appears to be out. No additional treatment needed at this time. Please call our office [phone] with any questions.

## 2020-06-02 NOTE — Telephone Encounter (Signed)
Spoke with patient and she will come in tomorrow at 12:30 to have excision site checked, JS

## 2020-06-03 ENCOUNTER — Encounter: Payer: Self-pay | Admitting: Dermatology

## 2020-06-03 ENCOUNTER — Other Ambulatory Visit: Payer: Self-pay

## 2020-06-03 ENCOUNTER — Ambulatory Visit (INDEPENDENT_AMBULATORY_CARE_PROVIDER_SITE_OTHER): Payer: Medicare Other | Admitting: Dermatology

## 2020-06-03 DIAGNOSIS — D489 Neoplasm of uncertain behavior, unspecified: Secondary | ICD-10-CM

## 2020-06-03 DIAGNOSIS — Z4889 Encounter for other specified surgical aftercare: Secondary | ICD-10-CM | POA: Diagnosis not present

## 2020-06-03 MED ORDER — DOXYCYCLINE MONOHYDRATE 100 MG PO CAPS: 100.0000 mg | ORAL_CAPSULE | Freq: Two times a day (BID) | ORAL | 0 refills | Status: AC

## 2020-06-03 MED ORDER — DOXYCYCLINE MONOHYDRATE 100 MG PO CAPS: 100.0000 mg | ORAL_CAPSULE | Freq: Two times a day (BID) | ORAL | 0 refills | Status: DC

## 2020-06-03 NOTE — Patient Instructions (Addendum)
Once sutures removed and steri-strips fall off, can apply Serica moisturizing scar formula nightly to help remodel scar

## 2020-06-03 NOTE — Progress Notes (Signed)
   Follow-Up Visit   Subjective  [name] is a 69 y.o. female who presents for the following: Wound Check (Follow up on wound on left medial calf near pretibia. Patient reports area is red, bruised and blistered. Reports some burning on occasion. Some dull aching. Denies any itching.)  States spoke with Dr. Laurence Ferrari over the weekend and was prescribed new antibiotic doxycycline 100 mg twice a day and tolerating well. She is also using mupirocin 2% ointment to affected area once daily. She was also advised to use an ace wrap to keep compression to the area which she has been doing.   She also notes two new spots at the left calf present just in the last couple of weeks. The one higher on the calf has been tender but is improving.  The following portions of the chart were reviewed this encounter and updated as appropriate:  Tobacco  Allergies  Meds  Problems  Med Hx  Surg Hx  Fam Hx      Objective  Well appearing patient in no apparent distress; mood and affect are within normal limits.  A focused examination was performed including left medial calf near pretibia. Relevant physical exam findings are noted in the Assessment and Plan.  Objective  right posterior calf x 1 left posterior calf inferior x 1 (2): 0.7 cm scaly erythematous papule   Left posterior calf inferior 0.5 cm erythematous tan macule   Objective  Left Lower Leg - Anterior: Incision site is clean, dry and intact with surrounding violaceous patch and small bullae  Assessment & Plan  Neoplasm of uncertain behavior (2) right posterior calf x 1 left posterior calf inferior x 1 (2)  Left calf posterior superior - favor AK vs early SCCis vs inflammatory. Discussed biopsy or LN2. Will defer today while healing surgical site and recheck site January 5th.  Left calf posterior inferior - favor lichenoid keratosis. Will recheck at follow-up.   Encounter for post surgical wound check Left Lower Leg -  Anterior  Continue doxycycline monohydrate 100 mg capsule by mouth twice daily, increase duration to 2 weeks (will send in new prescription for 7 more days).   Ordered Medications: doxycycline (MONODOX) 100 MG capsule  Return for as scheduled.   I, Ruthell Rummage, CMA, am acting as scribe for Forest Gleason, MD.  Documentation: I have reviewed the above documentation for accuracy and completeness, and I agree with the above.  Forest Gleason, MD

## 2020-06-10 ENCOUNTER — Ambulatory Visit: Payer: Medicare Other

## 2020-06-10 ENCOUNTER — Other Ambulatory Visit: Payer: Self-pay

## 2020-06-10 DIAGNOSIS — Z85828 Personal history of other malignant neoplasm of skin: Secondary | ICD-10-CM

## 2020-06-10 MED ORDER — DOXYCYCLINE MONOHYDRATE 100 MG PO CAPS: 100.0000 mg | ORAL_CAPSULE | Freq: Two times a day (BID) | ORAL | 0 refills | Status: DC

## 2020-06-10 MED ORDER — CIPROFLOXACIN HCL 500 MG PO TABS: 500.0000 mg | ORAL_TABLET | Freq: Two times a day (BID) | ORAL | 0 refills | Status: DC

## 2020-06-10 NOTE — Progress Notes (Signed)
1. History of SCC (squamous cell carcinoma) of skin Objective  left medial calf: Post excision suture removal for biopsy proven SCC.   Biopsy post surgical excision showed no residual SCC, margins free.   Images    Wound cleansed, sutures removed, wound cleansed, and bandage was applied.  Per phone call with Dr. Laurence Ferrari - Start Cipro 500 mg BID x 1 week.  Restart Doxycycline 100 mg BID x 1 week. Take with food.   Follow up in 1 week for video visit with Dr. Laurence Ferrari to recheck.       ciprofloxacin (CIPRO) 500 MG tablet - left medial calf  doxycycline (MONODOX) 100 MG capsule - left medial calf

## 2020-06-17 ENCOUNTER — Ambulatory Visit: Payer: Medicare Other

## 2020-06-17 ENCOUNTER — Other Ambulatory Visit: Payer: Self-pay

## 2020-06-17 DIAGNOSIS — Z85828 Personal history of other malignant neoplasm of skin: Secondary | ICD-10-CM

## 2020-06-17 DIAGNOSIS — C4492 Squamous cell carcinoma of skin, unspecified: Secondary | ICD-10-CM

## 2020-06-17 MED ORDER — GENTAMICIN SULFATE 0.1 % EX OINT
1.0000 | TOPICAL_OINTMENT | Freq: Two times a day (BID) | CUTANEOUS | 1 refills | Status: DC
Start: 2020-06-17 — End: 2020-09-02

## 2020-06-18 NOTE — Progress Notes (Signed)
   Follow-Up Visit   Subjective  Clemma Johnsen is a 69 y.o. female who presents for the following: Follow-up (Patient here today for wound check at excision site left medial calf.).  Patient with biopsy proven SCC at left medial calf excised 06/03/2020.   The following portions of the chart were reviewed this encounter and updated as appropriate:       Review of Systems:  No other skin or systemic complaints except as noted in HPI or Assessment and Plan.  Objective  Well appearing patient in no apparent distress; mood and affect are within normal limits.  A focused examination was performed including left medial calf. Relevant physical exam findings are noted in the Assessment and Plan.  Objective  Left medial calf: Crusted ulceration s/p excision  Images       Assessment & Plan  History of SCC (squamous cell carcinoma) of skin Left medial calf  S/P excision   Continue doxycycline and cipro until finished.   Continue wound care.   Other Related Medications ciprofloxacin (CIPRO) 500 MG tablet doxycycline (MONODOX) 100 MG capsule  Return as scheduled, for wound check .

## 2020-06-25 ENCOUNTER — Other Ambulatory Visit: Payer: Self-pay

## 2020-06-25 ENCOUNTER — Encounter: Payer: Self-pay | Admitting: Dermatology

## 2020-06-25 ENCOUNTER — Ambulatory Visit (INDEPENDENT_AMBULATORY_CARE_PROVIDER_SITE_OTHER): Payer: Medicare Other | Admitting: Dermatology

## 2020-06-25 DIAGNOSIS — Z85828 Personal history of other malignant neoplasm of skin: Secondary | ICD-10-CM | POA: Diagnosis not present

## 2020-06-25 DIAGNOSIS — L570 Actinic keratosis: Secondary | ICD-10-CM | POA: Diagnosis not present

## 2020-06-25 DIAGNOSIS — S81802S Unspecified open wound, left lower leg, sequela: Secondary | ICD-10-CM | POA: Diagnosis not present

## 2020-06-25 NOTE — Progress Notes (Signed)
   Follow-Up Visit   Subjective  [name] is a 70 y.o. female who presents for the following: Follow-up (Patient here today for 1 week wound check at left medial calf. She is currently using gentamicin and mupirocin. ).  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  The following portions of the chart were reviewed this encounter and updated as appropriate:   Tobacco  Allergies  Meds  Problems  Med Hx  Surg Hx  Fam Hx      Review of Systems:  No other skin or systemic complaints except as noted in HPI or Assessment and Plan.  Objective  Well appearing patient in no apparent distress; mood and affect are within normal limits.  A focused examination was performed including left calf. Relevant physical exam findings are noted in the Assessment and Plan.  Objective  left cheek: Erythematous thin papules/macules with gritty scale.   Objective  Left medial calf: Ulceration with overlying crust, improving  Images     Assessment & Plan  AK (actinic keratosis) left cheek  Destruction of lesion - left cheek Complexity: simple   Destruction method: cryotherapy   Informed consent: discussed and consent obtained   Lesion destroyed using liquid nitrogen: Yes   Cryotherapy cycles:  2 Outcome: patient tolerated procedure well with no complications   Post-procedure details: wound care instructions given    Open wound, lower leg, left, sequela  Open wound of left lower leg, sequela Left medial calf  S/p excision of SCC 05/27/2020   Unna boot applied today following mupirocin  Call for any concerns including pain   Return in about 1 week (around 07/02/2020) for unna boot and recheck previously treated AKs and ISKs.  Anise Salvo, RMA, am acting as scribe for Darden Dates, MD .  Documentation: I have reviewed the above documentation for accuracy and completeness, and I agree with the above.  Darden Dates, MD

## 2020-06-25 NOTE — Patient Instructions (Signed)
Cryotherapy Aftercare  . Wash gently with soap and water everyday.   . Apply Vaseline and Band-Aid daily until healed.  Prior to procedure, discussed risks of blister formation, small wound, skin dyspigmentation, or rare scar following cryotherapy.   

## 2020-07-02 ENCOUNTER — Telehealth: Payer: Self-pay

## 2020-07-02 ENCOUNTER — Ambulatory Visit: Payer: Medicare Other | Admitting: Dermatology

## 2020-07-02 DIAGNOSIS — Z20822 Contact with and (suspected) exposure to covid-19: Secondary | ICD-10-CM | POA: Diagnosis not present

## 2020-07-02 DIAGNOSIS — Z03818 Encounter for observation for suspected exposure to other biological agents ruled out: Secondary | ICD-10-CM | POA: Diagnosis not present

## 2020-07-02 NOTE — Telephone Encounter (Signed)
Patient is unable to come to appointment tomorrow to have unna boot removed due to her husband being admitted to hospital last night with Covid. I spoke with patient and she will remove unna boot and resume gentamicin and mupirocin daily, cover with bandage and either wear compression socks or wrap with ace bandage. She will send picture through MyChart once uncovered, JS

## 2020-07-03 ENCOUNTER — Ambulatory Visit: Payer: Medicare Other | Admitting: Dermatology

## 2020-07-04 ENCOUNTER — Encounter: Payer: Self-pay | Admitting: Family Medicine

## 2020-07-04 ENCOUNTER — Telehealth (INDEPENDENT_AMBULATORY_CARE_PROVIDER_SITE_OTHER): Payer: Medicare Other | Admitting: Physician Assistant

## 2020-07-04 ENCOUNTER — Other Ambulatory Visit: Payer: Self-pay | Admitting: Family Medicine

## 2020-07-04 DIAGNOSIS — U071 COVID-19: Secondary | ICD-10-CM

## 2020-07-04 NOTE — Progress Notes (Signed)
MyChart Video Visit    Virtual Visit via Video Note   This visit type was conducted due to national recommendations for restrictions regarding the COVID-19 Pandemic (e.g. social distancing) in an effort to limit this patient's exposure and mitigate transmission in our community. This patient is at least at moderate risk for complications without adequate follow up. This format is felt to be most appropriate for this patient at this time. Physical exam was limited by quality of the video and audio technology used for the visit.   Patient location: Home Provider location: [facility]  I discussed the limitations of evaluation and management by telemedicine and the availability of in person appointments. The patient expressed understanding and agreed to proceed.  Patient: [name]   DOB: [birth year]   70 y.o. Female  MRN: [id number] Visit Date: 07/04/2020  Today's healthcare provider: Mar Daring, PA-C   Chief Complaint  Patient presents with  . URI  . Covid Exposure  . Covid Positive  I,Porsha C McClurkin,acting as a scribe for [employer], PA-C.,have documented all relevant documentation on the behalf of Mar Daring, PA-C,as directed by  Mar Daring, PA-C while in the presence of Mar Daring, PA-C.  Subjective    URI  This is a new problem. The current episode started in the past 7 days. The problem has been unchanged. There has been no fever. Associated symptoms include chest pain, coughing, nausea and a sore throat. Pertinent negatives include no congestion, diarrhea, ear pain, headaches, rhinorrhea, sinus pain, sneezing, vomiting or wheezing. She has tried nothing for the symptoms. The treatment provided no relief.    Covid home test was positive. Had both covid vaccines and booster. Husband just got out of hospital today,07/04/2020 from covid. Was at [facility], went in on Tuesday. Husband is severely immunocompromised.     Patient Active Problem List   Diagnosis Date Noted  . Encounter for screening colonoscopy   . Polyp of transverse colon   . BPPV (benign paroxysmal positional vertigo) 10/11/2018  . Lumbosacral radiculopathy at S1 08/28/2018  . Neuropathy 01/18/2018  . Gastroesophageal reflux disease without esophagitis 07/18/2017  . Primary insomnia 07/18/2017  . 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 Anxiety disorder, unspecified 11/05/2015  . Major depressive disorder, single episode, unspecified 11/05/2015  . Gout, unspecified 01/01/2015  . Hypothyroidism, unspecified 01/01/2015  . Other hyperlipidemia 01/01/2015   Social History   Tobacco Use  . Smoking status: Never Smoker  . Smokeless tobacco: Never Used  Vaping Use  . Vaping Use: Never used   No Known Allergies    Medications: Outpatient Medications Prior to Visit  Medication Sig  . allopurinol (ZYLOPRIM) 300 MG tablet Take 1 tablet (300 mg total) by mouth daily.  . Ascorbic Acid (VITAMIN C) 500 MG CHEW Chew 2 tablets by mouth daily.  Marland Kitchen aspirin EC 81 MG tablet Take 1 tablet by mouth daily.  . Biotin 5 MG TABS Take 1 tablet by mouth daily.  . Cholecalciferol (VITAMIN D-1000 MAX ST) 25 MCG (1000 UT) tablet Take 1 tablet by mouth daily.  Marland Kitchen gabapentin (NEURONTIN) 100 MG capsule Take 1 capsule (100 mg total) by mouth at bedtime.  Marland Kitchen gentamicin ointment (GARAMYCIN) 0.1 % Apply 1 application topically 2 (two) times daily.  Marland Kitchen levothyroxine (SYNTHROID) 75 MCG tablet TAKE 1 TABLET(75 MCG) BY MOUTH DAILY  . Multiple Vitamins-Minerals (ZINC PO) Take by mouth.  . mupirocin ointment (BACTROBAN) 2 % Apply 1 application topically daily.  Marland Kitchen  Naproxen Sodium 220 MG CAPS Take 1 tablet by mouth at bedtime as needed.  Marland Kitchen omeprazole (PRILOSEC) 20 MG capsule Take 1 capsule by mouth daily.  . pseudoephedrine (SUDAFED) 30 MG tablet Take 15 mg by mouth in the morning and at bedtime.  . simvastatin (ZOCOR) 40 MG tablet TAKE 1 TABLET(40 MG) BY MOUTH  DAILY  . triamterene-hydrochlorothiazide (DYAZIDE) 37.5-25 MG capsule Take 1 each (1 capsule total) by mouth daily.  . Zinc 30 MG TABS Take 1 tablet by mouth daily.  Marland Kitchen zolpidem (AMBIEN) 5 MG tablet TAKE 1 TABLET(5 MG) BY MOUTH AT BEDTIME AS NEEDED   No facility-administered medications prior to visit.    Review of Systems  Constitutional: Positive for fatigue. Negative for appetite change, chills and fever.  HENT: Positive for postnasal drip, sinus pressure and sore throat. Negative for congestion, ear pain, rhinorrhea, sinus pain and sneezing.   Respiratory: Positive for cough and chest tightness. Negative for shortness of breath and wheezing.   Cardiovascular: Positive for chest pain.  Gastrointestinal: Positive for nausea. Negative for diarrhea and vomiting.  Neurological: Positive for weakness. Negative for headaches.    Last CBC Lab Results  Component Value Date   WBC 5.8 12/31/2019   HGB 12.6 12/31/2019   HCT 37.1 12/31/2019   MCV 90 12/31/2019   MCH 30.6 12/31/2019   RDW 13.1 12/31/2019   PLT 205 12/31/2019      Objective    LMP  (LMP Unknown) Comment: 1980s BP Readings from Last 3 Encounters:  03/10/20 102/65  12/31/19 107/70  10/01/19 111/69   Wt Readings from Last 3 Encounters:  03/10/20 175 lb (79.4 kg)  12/31/19 174 lb (78.9 kg)  10/01/19 179 lb (81.2 kg)      Physical Exam Vitals reviewed.  Constitutional:      General: She is not in acute distress.    Appearance: She is well-developed and well-nourished.  Eyes:     Extraocular Movements: EOM normal.  Pulmonary:     Effort: Pulmonary effort is normal. No respiratory distress.  Psychiatric:        Mood and Affect: Mood and affect and mood normal.        Thought Content: Thought content normal.        Judgment: Judgment normal.        Assessment & Plan     1. COVID-19 Discussed Covid 19 symptoms and treatment options. Discussed using Mucinex or Mucinex DM for congestion and cough.May use  tylenol and/or Ibuprofen for fever and body aches. Push fluids, rest as needed. Referral placed for consideration of MAB treatment due to husband's comorbidities and high risk for severe disease. Call if symptoms worsen or fail to improve.  - Ambulatory referral for Covid Treatment   No follow-ups on file.     I discussed the assessment and treatment plan with the patient. The patient was provided an opportunity to ask questions and all were answered. The patient agreed with the plan and demonstrated an understanding of the instructions.   The patient was advised to call back or seek an in-person evaluation if the symptoms worsen or if the condition fails to improve as anticipated.  I provided 27 minutes of face-to-face time during this encounter via MyChart Video enabled encounter.  Reynolds Bowl, PA-C, have reviewed all documentation for this visit. The documentation on 07/07/20 for the exam, diagnosis, procedures, and orders are all accurate and complete.  [place] [facility] [phone] (phone) [phone] (  fax)  [place]

## 2020-07-04 NOTE — Telephone Encounter (Signed)
Medication Refill - Medication: zolpidem (AMBIEN) 5 MG tablet    Has the patient contacted their pharmacy? Yes.   (Agent: If no, request that the patient contact the pharmacy for the refill.) (Agent: If yes, when and what did the pharmacy advise?)no refills available   Preferred Pharmacy (with phone number or street name): [id number] - [address], [place] [address]  [address] [place] [address]  Phone:  [phone] Fax:  [phone]   Agent: Please be advised that RX refills may take up to 3 business days. We ask that you follow-up with your pharmacy.

## 2020-07-04 NOTE — Telephone Encounter (Signed)
Requested medication (s) are due for refill today: yes  Requested medication (s) are on the active medication list: yes  Last refill:  11/06/2019  Future visit scheduled: yes  Notes to clinic:  this refill cannot be delegated    Requested Prescriptions  Pending Prescriptions Disp Refills   zolpidem (AMBIEN) 5 MG tablet 30 tablet 3    Sig: TAKE 1 TABLET(5 MG) BY MOUTH AT BEDTIME AS NEEDED      There is no refill protocol information for this order

## 2020-07-05 ENCOUNTER — Other Ambulatory Visit (HOSPITAL_COMMUNITY): Payer: Self-pay | Admitting: Unknown Physician Specialty

## 2020-07-05 ENCOUNTER — Telehealth: Payer: Self-pay | Admitting: Unknown Physician Specialty

## 2020-07-05 MED ORDER — MOLNUPIRAVIR EUA 200MG CAPSULE: 4.0000 | ORAL_CAPSULE | Freq: Two times a day (BID) | ORAL | 0 refills | Status: AC

## 2020-07-05 MED FILL — MOLNUPIRAVIR 200 MG CAPS: 200 | 5 days supply | Qty: 40 | Fill #0

## 2020-07-05 NOTE — Telephone Encounter (Signed)
Outpatient Oral COVID Treatment Note  I connected with [name] on 6/94/8546/2:70 PM by telephone and verified that I am speaking with the correct person using two identifiers.  I discussed the limitations, risks, security, and privacy concerns of performing an evaluation and management service by telephone and the availability of in person appointments. I also discussed with the patient that there may be a patient responsible charge related to this service. The patient expressed understanding and agreed to proceed.  Patient location: home Provider location: home  Diagnosis: COVID-19 infection  Purpose of visit: Discussion of potential use of Molnupiravir or Paxlovid, a new treatment for mild to moderate COVID-19 viral infection in non-hospitalized patients.   Subjective: Patient is a 70 y.o. female who has been diagnosed with COVID 19 viral infection.  Their symptoms began on 1/13 with cough.    Past Medical History:  Diagnosis Date  . Actinic keratosis   . Basal cell carcinoma 05/31/2019   left dorsal forearm  . Cancer ([place])    Meanoma,basal cell  . GERD (gastroesophageal reflux disease)   . Hypothyroidism   . Melanoma ([place])    chest  . 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 Excised 05/27/2020, margins free.    No Known Allergies   Current Outpatient Medications:  .  allopurinol (ZYLOPRIM) 300 MG tablet, Take 1 tablet (300 mg total) by mouth daily., Disp: 90 tablet, Rfl: 3 .  Ascorbic Acid (VITAMIN C) 500 MG CHEW, Chew 2 tablets by mouth daily., Disp: , Rfl:  .  aspirin EC 81 MG tablet, Take 1 tablet by mouth daily., Disp: , Rfl:  .  Biotin 5 MG TABS, Take 1 tablet by mouth daily., Disp: , Rfl:  .  Cholecalciferol (VITAMIN D-1000 MAX ST) 25 MCG (1000 UT) tablet, Take 1 tablet by mouth daily., Disp: , Rfl:  .  gabapentin (NEURONTIN) 100 MG capsule, Take 1 capsule (100 mg total) by mouth at bedtime., Disp: 90 capsule, Rfl: 1 .   gentamicin ointment (GARAMYCIN) 0.1 %, Apply 1 application topically 2 (two) times daily., Disp: 30 g, Rfl: 1 .  levothyroxine (SYNTHROID) 75 MCG tablet, TAKE 1 TABLET(75 MCG) BY MOUTH DAILY, Disp: 30 tablet, Rfl: 3 .  Multiple Vitamins-Minerals (ZINC PO), Take by mouth., Disp: , Rfl:  .  mupirocin ointment (BACTROBAN) 2 %, Apply 1 application topically daily., Disp: 22 g, Rfl: 0 .  Naproxen Sodium 220 MG CAPS, Take 1 tablet by mouth at bedtime as needed., Disp: , Rfl:  .  omeprazole (PRILOSEC) 20 MG capsule, Take 1 capsule by mouth daily., Disp: , Rfl:  .  pseudoephedrine (SUDAFED) 30 MG tablet, Take 15 mg by mouth in the morning and at bedtime., Disp: , Rfl:  .  simvastatin (ZOCOR) 40 MG tablet, TAKE 1 TABLET(40 MG) BY MOUTH DAILY, Disp: 90 tablet, Rfl: 3 .  triamterene-hydrochlorothiazide (DYAZIDE) 37.5-25 MG capsule, Take 1 each (1 capsule total) by mouth daily., Disp: 90 capsule, Rfl: 1 .  Zinc 30 MG TABS, Take 1 tablet by mouth daily., Disp: , Rfl:  .  zolpidem (AMBIEN) 5 MG tablet, TAKE 1 TABLET(5 MG) BY MOUTH AT BEDTIME AS NEEDED, Disp: 30 tablet, Rfl: 3  Objective: Patient appears/sounds like she has a URI.  They are in no apparent distress.  Breathing is non labored.  Mood and behavior are normal.  Laboratory Data:  No results found for this or any previous visit (from the past 2160 hour(s)).   Assessment: 69  y.o. female with mild/moderate COVID 19 viral infection diagnosed on 1/13 at high risk for progression to severe COVID 19.  Plan:  This patient is a 70 y.o. female that meets the following criteria for Emergency Use Authorization of: Molnupiravir  1. Age >18 yr 2. SARS-COV-2 positive test 3. Symptom onset < 5 days 4. Mild-to-moderate COVID disease with high risk for severe progression to hospitalization or death   I have spoken and communicated the following to the patient or parent/caregiver regarding: 1. Molnupiravir is an unapproved drug that is authorized for use under  an [employer] production planner.  2. There are no adequate, approved, available products for the treatment of COVID-19 in adults who have mild-to-moderate COVID-19 and are at high risk for progressing to severe COVID-19, including hospitalization or death. 3. Other therapeutics are currently authorized. For additional information on all products authorized for treatment or prevention of COVID-19, please see [employer].  4. There are benefits and risks of taking this treatment as outlined in the "Fact Sheet for Patients and Caregivers."  5. "Fact Sheet for Patients and Caregivers" was reviewed with patient. A hard copy will be provided to patient from pharmacy prior to the patient receiving treatment. 6. Patients should continue to self-isolate and use infection control measures (e.g., wear mask, isolate, social distance, avoid sharing personal items, clean and disinfect "high touch" surfaces, and frequent handwashing) according to CDC guidelines.  7. The patient or parent/caregiver has the option to accept or refuse treatment. 8. [place] has established a pregnancy surveillance program. 9. Females of childbearing potential should use a reliable method of contraception correctly and consistently, as applicable, for the duration of treatment and for 4 days after the last dose of Molnupiravir. 27. Males of reproductive potential who are sexually active with females of childbearing potential should use a reliable method of contraception correctly and consistently during treatment and for at least 3 months after the last dose. 11. Pregnancy status and risk was assessed. Patient verbalized understanding of precautions.   After reviewing above information with the patient, the patient agrees to receive molnupiravir.  Follow up instructions:    . Take prescription BID x 5 days as  directed . Reach out to pharmacist for counseling on medication if desired . For concerns regarding further COVID symptoms please follow up with your PCP or urgent care . For urgent or life-threatening issues, seek care at your local emergency department  The patient was provided an opportunity to ask questions, and all were answered. The patient agreed with the plan and demonstrated an understanding of the instructions.   Script sent to [facility] and opted to pick up RX.  The patient was advised to call their PCP or seek an in-person evaluation if the symptoms worsen or if the condition fails to improve as anticipated.   I provided 15 minutes of non face-to-face telephone visit time during this encounter, and > 50% was spent counseling as documented under my assessment & plan.  Kathrine Haddock, NP 07/05/2020 /2:15 PM

## 2020-07-07 ENCOUNTER — Encounter: Payer: Self-pay | Admitting: Physician Assistant

## 2020-07-07 NOTE — Patient Instructions (Signed)

## 2020-07-08 NOTE — Telephone Encounter (Signed)
Please schedule a virtual visit with me or with someone else.

## 2020-07-09 ENCOUNTER — Other Ambulatory Visit: Payer: Self-pay | Admitting: Family Medicine

## 2020-07-09 MED ORDER — ZOLPIDEM TARTRATE 5 MG PO TABS: ORAL_TABLET | ORAL | 3 refills | Status: DC

## 2020-07-10 ENCOUNTER — Other Ambulatory Visit: Payer: Self-pay | Admitting: Family Medicine

## 2020-07-10 ENCOUNTER — Ambulatory Visit: Payer: Self-pay | Admitting: *Deleted

## 2020-07-10 NOTE — Telephone Encounter (Signed)
Please review for refill. Refill sent to pharmacy on 07/09/20 had failed transmission.

## 2020-07-10 NOTE — Telephone Encounter (Signed)
  Patent requesting for ambien 5 mg refill be sent to [facility] [phone]. Patient has called pharmacy and pharmacy has reported they do not have Rx confirmed by PCP. Will resend request to PCP.    Reason for Disposition . Caller requesting a CONTROLLED substance prescription refill (e.g., narcotics, ADHD medicines)  Answer Assessment - Initial Assessment Questions 1. DRUG NAME: "What medicine do you need to have refilled?"     ambien 5 mg 2. REFILLS REMAINING: "How many refills are remaining?" (Note: The label on the medicine or pill bottle will show how many refills are remaining. If there are no refills remaining, then a renewal may be needed.)     Na  3. EXPIRATION DATE: "What is the expiration date?" (Note: The label states when the prescription will expire, and thus can no longer be refilled.)     na 4. PRESCRIBING HCP: "Who prescribed it?" Reason: If prescribed by specialist, call should be referred to that group.     PCP 5. SYMPTOMS: "Do you have any symptoms?"     na 6. PREGNANCY: "Is there any chance that you are pregnant?" "When was your last menstrual period?"     na  Protocols used: MEDICATION REFILL AND RENEWAL CALL-A-AH

## 2020-07-11 MED ORDER — ZOLPIDEM TARTRATE 5 MG PO TABS: ORAL_TABLET | ORAL | 3 refills | Status: DC

## 2020-07-11 NOTE — Telephone Encounter (Signed)
Please advise rx, last one that was sent was not received by pharmacy.

## 2020-07-11 NOTE — Addendum Note (Signed)
Addended by: Julieta Bellini on: 07/11/2020 11:23 AM   Modules accepted: Orders

## 2020-07-14 ENCOUNTER — Telehealth: Payer: Self-pay

## 2020-07-14 NOTE — Telephone Encounter (Signed)
Patient called and is wondering how is the best way for you to look at excision site at leg. She is still testing positive for Covid as of last Thursday but doesn't think leg is healing like it should be. I suggested just sending a photo or video visit.

## 2020-07-15 NOTE — Telephone Encounter (Signed)
As long as her symptoms have significantly improved we can see her this Thursday in clinic since it has already been 10 days since her positive COVID test.

## 2020-07-15 NOTE — Telephone Encounter (Signed)
Called patient and she had already called office and spoke with Estill Bamberg, scheduled Thursday afternoon, JS

## 2020-07-17 ENCOUNTER — Other Ambulatory Visit: Payer: Self-pay

## 2020-07-17 ENCOUNTER — Ambulatory Visit: Payer: Medicare Other | Admitting: Dermatology

## 2020-07-17 DIAGNOSIS — D485 Neoplasm of uncertain behavior of skin: Secondary | ICD-10-CM | POA: Diagnosis not present

## 2020-07-17 DIAGNOSIS — L98499 Non-pressure chronic ulcer of skin of other sites with unspecified severity: Secondary | ICD-10-CM | POA: Diagnosis not present

## 2020-07-17 DIAGNOSIS — S81802A Unspecified open wound, left lower leg, initial encounter: Secondary | ICD-10-CM | POA: Diagnosis not present

## 2020-07-17 DIAGNOSIS — T148XXA Other injury of unspecified body region, initial encounter: Secondary | ICD-10-CM

## 2020-07-17 NOTE — Patient Instructions (Signed)
Recommend OTC Gold Bond Rapid Relief Anti-Itch cream (pramoxine + menthol) up to 3 times per day to areas that are itchy.

## 2020-07-17 NOTE — Progress Notes (Signed)
   Follow-Up Visit   Subjective  [name] is a 70 y.o. female who presents for the following: Follow-up (Patient here for follow up of open wound at left lower leg. ).  Previously treated with unna boot.   The following portions of the chart were reviewed this encounter and updated as appropriate:   Tobacco  Allergies  Meds  Problems  Med Hx  Surg Hx  Fam Hx      Review of Systems:  No other skin or systemic complaints except as noted in HPI or Assessment and Plan.  Objective  Well appearing patient in no apparent distress; mood and affect are within normal limits.  A focused examination was performed including left lower leg. Relevant physical exam findings are noted in the Assessment and Plan.  Objective  Left medial calf: Ulceration, improved from prior  Objective  Left Occipital Scalp: 0.8 cm skin colored and brown papule  Objective  Left Lower Leg - Anterior: Tan macule   Assessment & Plan  Open wound Left medial calf  Ulceration secondary to tension at surgical site. Improving.   Mupirocin and unna boot applied today.   Neoplasm of uncertain behavior of skin (2) Left Occipital Scalp  Left Lower Leg - Anterior  Scalp - Favor nevus vs SK.   Benign-appearing.  Observation.  Call clinic for new or changing lesions.  Recommend daily use of broad spectrum spf 30+ sunscreen to sun-exposed areas.   Left pretibia - Favor post-inflammatory hyperpigmentation.   Benign-appearing.  Observation.  Call clinic for new or changing lesions.  Recommend daily use of broad spectrum spf 30+ sunscreen to sun-exposed areas.    Other Related Medications mupirocin ointment (BACTROBAN) 2 %  Return in about 1 week (around 07/24/2020) for unna boot.  [name], RMA, am acting as scribe for Forest Gleason, MD .  Documentation: I have reviewed the above documentation for accuracy and completeness, and I agree with the above.  Forest Gleason, MD

## 2020-07-21 ENCOUNTER — Encounter: Payer: Self-pay | Admitting: Dermatology

## 2020-07-24 ENCOUNTER — Other Ambulatory Visit: Payer: Self-pay | Admitting: Family Medicine

## 2020-07-27 ENCOUNTER — Other Ambulatory Visit: Payer: Self-pay | Admitting: Family Medicine

## 2020-07-27 DIAGNOSIS — I10 Essential (primary) hypertension: Secondary | ICD-10-CM

## 2020-07-30 ENCOUNTER — Encounter: Payer: Self-pay | Admitting: Dermatology

## 2020-07-30 ENCOUNTER — Other Ambulatory Visit: Payer: Self-pay

## 2020-07-30 ENCOUNTER — Ambulatory Visit: Payer: Medicare Other | Admitting: Dermatology

## 2020-07-30 DIAGNOSIS — L97221 Non-pressure chronic ulcer of left calf limited to breakdown of skin: Secondary | ICD-10-CM | POA: Diagnosis not present

## 2020-07-30 DIAGNOSIS — I87312 Chronic venous hypertension (idiopathic) with ulcer of left lower extremity: Secondary | ICD-10-CM | POA: Diagnosis not present

## 2020-07-30 DIAGNOSIS — L98491 Non-pressure chronic ulcer of skin of other sites limited to breakdown of skin: Secondary | ICD-10-CM

## 2020-07-30 NOTE — Progress Notes (Addendum)
   Follow-Up Visit   Subjective  [name] is a 70 y.o. female who presents for the following: Follow-up (Patient here today for 2 week follow up. ).  Follow up for open wound at left lower leg following excision of SCC 2 months ago. Previously treated with unna boot.   The following portions of the chart were reviewed this encounter and updated as appropriate:   Tobacco  Allergies  Meds  Problems  Med Hx  Surg Hx  Fam Hx      Review of Systems:  No other skin or systemic complaints except as noted in HPI or Assessment and Plan.  Objective  Well appearing patient in no apparent distress; mood and affect are within normal limits.  A focused examination was performed including left leg. Relevant physical exam findings are noted in the Assessment and Plan.  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  [name], RMA, am acting as scribe for Forest Gleason, MD .  Documentation: I have reviewed the above documentation for accuracy and completeness, and I agree with the above.  Forest Gleason, MD

## 2020-07-30 NOTE — Patient Instructions (Signed)
If not approved for Duoderm with insurance, can try [facility] advanced healing hydrocolloid dressings

## 2020-08-19 ENCOUNTER — Other Ambulatory Visit: Payer: Self-pay

## 2020-08-19 ENCOUNTER — Encounter: Payer: Self-pay | Admitting: Dermatology

## 2020-08-19 ENCOUNTER — Ambulatory Visit: Payer: Medicare Other | Admitting: Dermatology

## 2020-08-19 DIAGNOSIS — S81802A Unspecified open wound, left lower leg, initial encounter: Secondary | ICD-10-CM

## 2020-08-19 DIAGNOSIS — Z85828 Personal history of other malignant neoplasm of skin: Secondary | ICD-10-CM

## 2020-08-19 DIAGNOSIS — T148XXA Other injury of unspecified body region, initial encounter: Secondary | ICD-10-CM

## 2020-08-19 NOTE — Progress Notes (Addendum)
   Follow-Up Visit   Subjective  [name] is a 70 y.o. female who presents for the following: Follow-up (Patient here today for 3 week open wound follow up. Patient is only using the blister band aids/hydrocolloid dressings. She things it has caused some trauma with removal. She has not been using the compression wrap).  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   The following portions of the chart were reviewed this encounter and updated as appropriate:   Tobacco  Allergies  Meds  Problems  Med Hx  Surg Hx  Fam Hx      Review of Systems:  No other skin or systemic complaints except as noted in HPI or Assessment and Plan.  Objective  Well appearing patient in no apparent distress; mood and affect are within normal limits.  A focused examination was performed including left leg. Relevant physical exam findings are noted in the Assessment and Plan.  Objective  Left medial calf: Ulceration with granulation tissue and central fibrinous, necrotic debris   Assessment & Plan  Open wound Left medial calf  D/c blister band aids  Start unna boot today for next 7-8 days.   Recommend compression daily once out of unna boot  Area lightly debrided to remove small amount of necrotic tissue and granulation tissue treated with silver nitrate  Open area cleaned with Puracyn, mupirocin applied to open area, vaseline applied to rest of the lower leg and unna boot applied.   Return in about 1 week (around 08/26/2020) for unna boot.  [name], RMA, am acting as scribe for Forest Gleason, MD .  Documentation: I have reviewed the above documentation for accuracy and completeness, and I agree with the above.  Forest Gleason, MD

## 2020-08-20 ENCOUNTER — Other Ambulatory Visit: Payer: Self-pay | Admitting: Family Medicine

## 2020-08-20 DIAGNOSIS — Z1231 Encounter for screening mammogram for malignant neoplasm of breast: Secondary | ICD-10-CM

## 2020-08-26 ENCOUNTER — Ambulatory Visit: Payer: Medicare Other | Admitting: Dermatology

## 2020-08-26 ENCOUNTER — Other Ambulatory Visit: Payer: Self-pay

## 2020-08-26 ENCOUNTER — Encounter: Payer: Self-pay | Admitting: Dermatology

## 2020-08-26 DIAGNOSIS — I87312 Chronic venous hypertension (idiopathic) with ulcer of left lower extremity: Secondary | ICD-10-CM | POA: Diagnosis not present

## 2020-08-26 DIAGNOSIS — S81802D Unspecified open wound, left lower leg, subsequent encounter: Secondary | ICD-10-CM | POA: Diagnosis not present

## 2020-08-26 DIAGNOSIS — T148XXA Other injury of unspecified body region, initial encounter: Secondary | ICD-10-CM

## 2020-08-26 NOTE — Progress Notes (Signed)
   Follow-Up Visit   Subjective  [name] is a 70 y.o. female who presents for the following: Follow-up (3 week follow up on open wound). 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     The following portions of the chart were reviewed this encounter and updated as appropriate:   Tobacco  Allergies  Meds  Problems  Med Hx  Surg Hx  Fam Hx      Review of Systems:  No other skin or systemic complaints except as noted in HPI or Assessment and Plan.  Objective  Well appearing patient in no apparent distress; mood and affect are within normal limits.  A focused examination was performed including left lower leg. Relevant physical exam findings are noted in the Assessment and Plan.  Objective  Left medial calf: Ulcer with granulation tissue  Images     Assessment & Plan  Open wound Left medial calf  Some improvement centrally, no improvement lateral borders D/C Unna boots due to swelling in feet and left great toe + neuropathy  Silver nitrate cautery performed to remove granulation tissue    Start  Vaseline, gauge and wrap daily, area wrapped in the office today    Will prescribe Hydrofiber dressing to apply followed by Tegaderm and leave in place for up to 7 days. Change sooner if dressing becomes saturated. If dressing sticks at time of removal, moisten with saline before removing.  Return in about 2 weeks (around 09/09/2020).   I, Marye Round, CMA, am acting as scribe for Forest Gleason, MD .  Documentation: I have reviewed the above documentation for accuracy and completeness, and I agree with the above.  Forest Gleason, MD

## 2020-08-27 ENCOUNTER — Other Ambulatory Visit: Payer: Self-pay

## 2020-08-27 ENCOUNTER — Ambulatory Visit (INDEPENDENT_AMBULATORY_CARE_PROVIDER_SITE_OTHER): Payer: Medicare Other | Admitting: Dermatology

## 2020-08-27 ENCOUNTER — Ambulatory Visit: Payer: Medicare Other | Admitting: Dermatology

## 2020-08-27 DIAGNOSIS — T148XXA Other injury of unspecified body region, initial encounter: Secondary | ICD-10-CM

## 2020-08-27 DIAGNOSIS — S81802D Unspecified open wound, left lower leg, subsequent encounter: Secondary | ICD-10-CM

## 2020-08-27 MED ORDER — DOXYCYCLINE MONOHYDRATE 100 MG PO CAPS: 100.0000 mg | ORAL_CAPSULE | Freq: Two times a day (BID) | ORAL | 0 refills | Status: AC

## 2020-08-27 NOTE — Patient Instructions (Addendum)
Doxycycline should be taken with food to prevent nausea. Do not lay down for 30 minutes after taking. Be cautious with sun exposure and use good sun protection while on this medication. Pregnant women should not take this medication.   Recommend WoundSeal if any bleeding at home   If drainage or bleeding soaks through [employer] while you are out of town, please promptly remove the boot. Then wash gently with soap and water, apply mupirocin ointment and gentamicin ointments, cover with a non-stick/non-adherent pad, and then an ace wrap for compression. Please also send [place] a message.   Plan to replace Unna boot in approximately 1 week.

## 2020-08-27 NOTE — Progress Notes (Signed)
   Follow-Up Visit   Subjective  [name] is a 70 y.o. female who presents for the following: Follow-up (Patient here today to have open wound checked at left medial calf.).  Patient accompanied by husband.  The following portions of the chart were reviewed this encounter and updated as appropriate:   Tobacco  Allergies  Meds  Problems  Med Hx  Surg Hx  Fam Hx      Review of Systems:  No other skin or systemic complaints except as noted in HPI or Assessment and Plan.  Objective  Well appearing patient in no apparent distress; mood and affect are within normal limits.  A focused examination was performed including left lower leg. Relevant physical exam findings are noted in the Assessment and Plan.  Objective  Left medial calf: Ulceration, some decrease in width compared to 3 weeks ago.   Images         Assessment & Plan  Open wound Left medial calf  S/p excision with necrosis due to high tension. Healing likely stalled due to cessation of compression between visits as well as granulation tissue formation previously treated.  Culture performed today  Replace Unna boot today  If unna boot becomes soiled before follow-up while she is at the beach, remove.  Then restart mupirocin daily followed by gentamicin then cover with non-stick gauze and Ace wrap for compression  Start doxycycline 100mg  twice daily with food x 14 days #28  Will place referral to wound center  Doxycycline should be taken with food to prevent nausea. Do not lay down for 30 minutes after taking. Be cautious with sun exposure and use good sun protection while on this medication. Pregnant women should not take this medication.    Other Related Procedures Anaerobic and Aerobic Culture  Ordered Medications: doxycycline (MONODOX) 100 MG capsule  Return in about 1 week (around 09/03/2020).  [name], RMA, am acting as scribe for Forest Gleason, MD .  Documentation: I have  reviewed the above documentation for accuracy and completeness, and I agree with the above.  Forest Gleason, MD

## 2020-08-31 ENCOUNTER — Encounter: Payer: Self-pay | Admitting: Dermatology

## 2020-08-31 ENCOUNTER — Other Ambulatory Visit: Payer: Self-pay | Admitting: Dermatology

## 2020-08-31 DIAGNOSIS — D485 Neoplasm of uncertain behavior of skin: Secondary | ICD-10-CM

## 2020-08-31 MED ORDER — MUPIROCIN 2 % EX OINT: 1.0000 "application " | TOPICAL_OINTMENT | Freq: Every day | CUTANEOUS | 2 refills | Status: DC

## 2020-08-31 NOTE — Progress Notes (Signed)
Mupirocin refilled

## 2020-09-01 ENCOUNTER — Other Ambulatory Visit: Payer: Self-pay

## 2020-09-01 DIAGNOSIS — T148XXA Other injury of unspecified body region, initial encounter: Secondary | ICD-10-CM

## 2020-09-01 NOTE — Progress Notes (Signed)
Referral sent, JS

## 2020-09-02 ENCOUNTER — Encounter: Payer: Self-pay | Admitting: Dermatology

## 2020-09-02 ENCOUNTER — Ambulatory Visit (INDEPENDENT_AMBULATORY_CARE_PROVIDER_SITE_OTHER): Payer: Medicare Other | Admitting: Dermatology

## 2020-09-02 ENCOUNTER — Other Ambulatory Visit: Payer: Self-pay

## 2020-09-02 DIAGNOSIS — T148XXA Other injury of unspecified body region, initial encounter: Secondary | ICD-10-CM

## 2020-09-02 DIAGNOSIS — I96 Gangrene, not elsewhere classified: Secondary | ICD-10-CM

## 2020-09-02 DIAGNOSIS — C4492 Squamous cell carcinoma of skin, unspecified: Secondary | ICD-10-CM | POA: Diagnosis not present

## 2020-09-02 DIAGNOSIS — L089 Local infection of the skin and subcutaneous tissue, unspecified: Secondary | ICD-10-CM

## 2020-09-02 DIAGNOSIS — S81802A Unspecified open wound, left lower leg, initial encounter: Secondary | ICD-10-CM

## 2020-09-02 LAB — ANAEROBIC AND AEROBIC CULTURE

## 2020-09-02 MED ORDER — AMOXICILLIN 500 MG PO CAPS: 500.0000 mg | ORAL_CAPSULE | Freq: Two times a day (BID) | ORAL | 0 refills | Status: AC

## 2020-09-02 MED ORDER — GENTAMICIN SULFATE 0.1 % EX OINT: 1.0000 "application " | TOPICAL_OINTMENT | Freq: Two times a day (BID) | CUTANEOUS | 1 refills | Status: DC

## 2020-09-02 NOTE — Progress Notes (Signed)
   Follow-Up Visit   Subjective  [name] is a 70 y.o. female who presents for the following: Ulceration (Healing ulceration S/P excision on 05/27/20, patient currently using Doxycycline 100mg  po BID, Mupirocin 2% ointment followed by Gentamycin a non-stick gauze then Coban wrap. She had bleeding through the unna boot on Sunday and removed the unna boot on Monday).   The following portions of the chart were reviewed this encounter and updated as appropriate:   Tobacco  Allergies  Meds  Problems  Med Hx  Surg Hx  Fam Hx     Review of Systems:  No other skin or systemic complaints except as noted in HPI or Assessment and Plan.  Objective  Well appearing patient in no apparent distress; mood and affect are within normal limits.  A focused examination was performed including the left lower leg. Relevant physical exam findings are noted in the Assessment and Plan.  Objective  L med calf: Healing ulceration  Assessment & Plan  Open wound L med calf  Improving - S/P excision with necrosis due to high tension. Healing likely stalled due to cessation of compression between visits as well as granulation tissue formation previously treated.  Some improvement visible compared to 1 week ago.  Culture positive for enterococcus faecalis - D/C Doxycycline continue Mupirocin 2% ointment and start Amoxicillin 500mg  BID x 1 week.   If wound soaks through [employer] before follow-up, remove Unna boot, then start daily wound care include clean gently with soap and water, apply Mupirocin and Gentamicin to open wound, cover with non-adherent pad and wrap leg with Ace wrap for compression.  The area was cleansed with Puracyn spray, Mupirocin 2% ointment was applied to aa, then covered with 2 non-stick gauze pads. Next, the entire lower leg was covered in Vaseline, and a unna boot was applied.   amoxicillin (AMOXIL) 500 MG capsule - L med calf  Other Related Medications doxycycline  (MONODOX) 100 MG capsule  Squamous cell carcinoma of skin  Reordered Medications gentamicin ointment (GARAMYCIN) 0.1 %  Local infection of skin and subcutaneous tissue Left Lower Leg - Anterior  Start amoxicillin 500 mg twice a day x 10 days.   Return in about 1 week (around 09/09/2020) for follow up/unna boot.  Luther Redo, CMA, am acting as scribe for Forest Gleason, MD .  Documentation: I have reviewed the above documentation for accuracy and completeness, and I agree with the above.  Forest Gleason, MD

## 2020-09-03 ENCOUNTER — Other Ambulatory Visit: Payer: Self-pay

## 2020-09-03 DIAGNOSIS — T148XXA Other injury of unspecified body region, initial encounter: Secondary | ICD-10-CM

## 2020-09-03 NOTE — Progress Notes (Signed)
Referral to wound clinic. aw

## 2020-09-04 ENCOUNTER — Ambulatory Visit: Payer: Medicare Other | Admitting: Dermatology

## 2020-09-09 ENCOUNTER — Other Ambulatory Visit: Payer: Self-pay

## 2020-09-09 ENCOUNTER — Ambulatory Visit
Admission: RE | Admit: 2020-09-09 | Discharge: 2020-09-09 | Disposition: A | Discharge status: Home / Self Care | Payer: Medicare Other | Source: Ambulatory Visit | Attending: Family Medicine | Admitting: Family Medicine

## 2020-09-09 DIAGNOSIS — Z1231 Encounter for screening mammogram for malignant neoplasm of breast: Secondary | ICD-10-CM | POA: Insufficient documentation

## 2020-09-10 ENCOUNTER — Ambulatory Visit (INDEPENDENT_AMBULATORY_CARE_PROVIDER_SITE_OTHER): Payer: Medicare Other | Admitting: Dermatology

## 2020-09-10 ENCOUNTER — Encounter: Payer: Self-pay | Admitting: Dermatology

## 2020-09-10 DIAGNOSIS — T148XXA Other injury of unspecified body region, initial encounter: Secondary | ICD-10-CM

## 2020-09-10 DIAGNOSIS — S81802D Unspecified open wound, left lower leg, subsequent encounter: Secondary | ICD-10-CM | POA: Diagnosis not present

## 2020-09-10 NOTE — Progress Notes (Signed)
   Follow-Up Visit   Subjective  [name] is a 70 y.o. female who presents for the following: Wound Check (2 week f/u for open wound on left medial calf. Pts unna boot is still on. Wound did not bleed through. Pt states that it bothers her occasionally but that she for the most part just tries to ignore it. ).  The following portions of the chart were reviewed this encounter and updated as appropriate:  Tobacco  Allergies  Meds  Problems  Med Hx  Surg Hx  Fam Hx      Review of Systems: No other skin or systemic complaints except as noted in HPI or Assessment and Plan.   Objective  Well appearing patient in no apparent distress; mood and affect are within normal limits.  A focused examination was performed including left leg. Relevant physical exam findings are noted in the Assessment and Plan.  Objective  left medial calf: Ulceration at left lower leg, improved  Assessment & Plan  Open wound left medial calf  Mupirocin and gentamicin applied to wound and new unna boot placed today.  Other Related Medications doxycycline (MONODOX) 100 MG capsule  Return in about 1 week (around 09/17/2020).   I, Harriett Sine, CMA, am acting as scribe for Forest Gleason, MD.  Documentation: I have reviewed the above documentation for accuracy and completeness, and I agree with the above.  Forest Gleason, MD

## 2020-09-11 ENCOUNTER — Encounter: Payer: Self-pay | Admitting: Dermatology

## 2020-09-18 ENCOUNTER — Other Ambulatory Visit: Payer: Self-pay

## 2020-09-18 ENCOUNTER — Ambulatory Visit: Payer: Medicare Other | Admitting: Dermatology

## 2020-09-18 ENCOUNTER — Encounter: Payer: Medicare Other | Attending: Physician Assistant | Admitting: Physician Assistant

## 2020-09-18 DIAGNOSIS — L03116 Cellulitis of left lower limb: Secondary | ICD-10-CM | POA: Insufficient documentation

## 2020-09-18 DIAGNOSIS — C44729 Squamous cell carcinoma of skin of left lower limb, including hip: Secondary | ICD-10-CM | POA: Diagnosis not present

## 2020-09-18 DIAGNOSIS — L97822 Non-pressure chronic ulcer of other part of left lower leg with fat layer exposed: Secondary | ICD-10-CM | POA: Diagnosis not present

## 2020-09-18 DIAGNOSIS — Y838 Other surgical procedures as the cause of abnormal reaction of the patient, or of later complication, without mention of misadventure at the time of the procedure: Secondary | ICD-10-CM | POA: Diagnosis not present

## 2020-09-18 DIAGNOSIS — T8131XA Disruption of external operation (surgical) wound, not elsewhere classified, initial encounter: Secondary | ICD-10-CM | POA: Diagnosis not present

## 2020-09-18 NOTE — Progress Notes (Signed)
[name], [name] ([id number]) Visit Report for 09/18/2020 Chief Complaint Document Details Patient Name: [name], [name] Date of Service: 09/18/2020 1:00 PM Medical Record Number: [id number] Patient Account Number: [id number] Date of Birth/Sex: 1950/12/23 (70 y.o. F) Treating RN: Dolan Amen Primary Care Provider: Miguel Aschoff Other Clinician: Jeanine Luz Referring Provider: Miguel Aschoff Treating Provider/Extender: Skipper Cliche in Treatment: 0 Information Obtained from: Patient Chief Complaint Left LE Surgical Ulcer Electronic Signature(s) Signed: 09/18/2020 1:48:44 PM By: Worthy Keeler PA-C Entered By: Worthy Keeler on 97/67/3419 37:90:24 [name], [name] ([id number]) -------------------------------------------------------------------------------- HPI Details Patient Name: [name] Date of Service: 09/18/2020 1:00 PM Medical Record Number: [id number] Patient Account Number: [id number] Date of Birth/Sex: Aug 15, 1950 (70 y.o. F) Treating RN: Dolan Amen Primary Care Provider: Miguel Aschoff Other Clinician: Jeanine Luz Referring Provider: Miguel Aschoff Treating Provider/Extender: Skipper Cliche in Treatment: 0 History of Present Illness HPI Description: 09/18/2020 upon evaluation today patient presents for initial inspection here in clinic concerning issues that she has been having unfortunately with a wound on [name] which has been present actually since December 7 when she had a squamous cell carcinoma removed. Subsequently I did review the pictures that she had on [name] of the carcinoma site post excision and it was quite significant. With that being said the tension that was needed to pull the edges of the wound together to approximate apparently caused some issues as far as appropriate healing was concerned and this became necrotic and unfortunately dehisced. Subsequently the patient has since  been undergoing treatment at the [facility] where to be honest they have been attempting as much as possible to manage both infections as well as the overall swelling of the leg. They have been placing the patient in Unna boots for the past 3 weeks. Subsequently she also did have a culture which was on 08/27/2020 that showed Enterococcus and then she was on doxycycline at that point. Subsequently she was switched to amoxicillin based on the culture on 09/02/2020 she is done with that at this point. With that being said the patient is very frustrated with how long this has been going on she notes that today during our conversation. This was a squamous cell carcinoma that led to this need for surgery. Electronic Signature(s) Signed: 09/18/2020 5:23:29 PM By: Worthy Keeler PA-C Entered By: Worthy Keeler on 62/22/9798 92:11:94 [name], [name] ([id number]) -------------------------------------------------------------------------------- Physical Exam Details Patient Name: [name] Date of Service: 09/18/2020 1:00 PM Medical Record Number: [id number] Patient Account Number: [id number] Date of Birth/Sex: [birth year] (70 y.o. F) Treating RN: Dolan Amen Primary Care Provider: Miguel Aschoff Other Clinician: Jeanine Luz Referring Provider: Miguel Aschoff Treating Provider/Extender: Jeri Cos Weeks in Treatment: 0 Constitutional sitting or standing blood pressure is within target range for patient.. pulse regular and within target range for patient.Marland Kitchen respirations regular, non- labored and within target range for patient.Marland Kitchen temperature within target range for patient.. Well-nourished and well-hydrated in no acute distress. Eyes conjunctiva clear no eyelid edema noted. pupils equal round and reactive to light and accommodation. Ears, Nose, Mouth, and Throat no gross abnormality of ear auricles or external auditory canals. normal hearing noted during conversation.  mucus membranes moist. Respiratory normal breathing without difficulty. Cardiovascular 2+ dorsalis pedis/posterior tibialis pulses. no clubbing, cyanosis, significant edema, <3 sec cap refill. Musculoskeletal normal gait and posture. no significant deformity or arthritic changes, no loss or range of motion, no clubbing. Psychiatric this patient is able to make decisions and demonstrates good insight  into disease process. Alert and Oriented x 3. pleasant and cooperative. Notes Upon inspection patient's wound actually showed signs of some hyper granulation today. There really was not any significant slough buildup which is good news with that being said I am concerned about the fact that obviously with the hypergranulation the new epithelial tissue which does show signs of trying to grow is not really able to grow significantly well and when it does it does not stay attached very well due to the poor granulation underneath. I think we need to try to level things out here to get [name] moving in the correct direction. Also believe that a 3 layer compression wrap will be preferable over the Unna boot wrap. Electronic Signature(s) Signed: 09/18/2020 5:24:04 PM By: Worthy Keeler PA-C Entered By: Worthy Keeler on 61/95/0932 67:12:45 [name], [name] ([id number]) -------------------------------------------------------------------------------- Physician Orders Details Patient Name: [name] Date of Service: 09/18/2020 1:00 PM Medical Record Number: [id number] Patient Account Number: [id number] Date of Birth/Sex: 07-Feb-1951 (70 y.o. 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 Bathing/ Shower/ Hygiene o May shower with wound dressing protected with water repellent cover or cast protector. Edema Control - Lymphedema / Segmental Compressive Device / Other Left Lower Extremity o Optional: One layer of unna paste to top of compression wrap (to act as an anchor). o 3 Layer Compression System for Lymphedema. o Elevate legs to the level of the heart and pump ankles as often as possible o Elevate leg(s) parallel to the floor when sitting. Wound Treatment Wound #1 - Lower Leg Wound Laterality: Left, Medial Cleanser: Soap and Water 1 x Per Week/15 Days Discharge Instructions: Gently cleanse wound with antibacterial soap, rinse and pat dry prior to dressing wounds Peri-Wound Care: Desitin Maximum Strength Ointment 4 (oz) 1 x Per Week/15 Days Discharge Instructions: Apply around wound Primary Dressing: Hydrofera Blue Ready Transfer Foam, 2.5x2.5 (in/in) 1 x Per Week/15 Days Discharge Instructions: Apply Hydrofera Blue Ready to wound bed as directed Secondary Dressing: ABD Pad 5x9 (in/in) 1 x Per Week/15 Days Discharge Instructions: Cover with ABD pad Compression Wrap: Profore Lite LF 3 Multilayer Compression Bandaging System 1 x Per Week/15 Days Discharge Instructions: Apply 3 multi-layer wrap as prescribed. Electronic Signature(s) Signed: 09/18/2020 5:09:32 PM By: Georges Mouse, Minus Breeding RN Signed: 09/18/2020 5:44:32 PM By: Worthy Keeler PA-C Entered By: Georges Mouse, Minus Breeding on 41/93/7902 40:97:35 [name], [name] Schwartz ([id number]) -------------------------------------------------------------------------------- Problem List Details Patient Name: [name] Date of Service: 09/18/2020 1:00 PM Medical Record Number: [id number] Patient Account Number: [id number] Date of Birth/Sex: 1951/03/03 (70 y.o. 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 F) Treating RN: Dolan Amen Primary Care Provider: Miguel Aschoff Other Clinician: Jeanine Luz Referring Provider: Miguel Aschoff Treating Provider/Extender: Skipper Cliche in Treatment: 0 Subjective Chief Complaint Information obtained from Patient Left LE Surgical Ulcer History of Present Illness (HPI) 09/18/2020 upon evaluation today patient presents for initial inspection here in clinic concerning issues that she has been having unfortunately with a wound on [name] which has been present actually since December 7 when she had a squamous cell carcinoma removed. Subsequently I did  review the pictures that she had on [name] of the carcinoma site post excision and it was quite significant. With that being said the tension that was needed to pull the edges of the wound together to approximate apparently caused some issues as far as appropriate healing was concerned and this became necrotic and unfortunately dehisced. Subsequently the patient has since been undergoing treatment at the [facility] where to be honest they have been attempting as much as possible to manage both infections as well as the overall swelling of the leg. They have been placing the patient in Unna boots for the past 3 weeks. Subsequently she also did have a culture which was on 08/27/2020 that showed Enterococcus and then she was on doxycycline at that point. Subsequently she was switched to amoxicillin based on the culture on 09/02/2020 she is done with that at this point. With that being said the patient is very frustrated with how long this has been going on she notes that today during our conversation. This was a squamous cell carcinoma that led to this need for surgery. Patient History Information obtained from Patient. Allergies No Known Allergies Social History Never smoker, Marital Status - Married, Alcohol Use - Rarely, Drug Use - No History, Caffeine Use - Daily. Medical History Eyes Patient has history of Cataracts Ear/Nose/Mouth/Throat Denies history of Chronic sinus problems/congestion, Middle ear problems Hematologic/Lymphatic Denies history of Anemia, Hemophilia, Human Immunodeficiency Virus, Lymphedema, Sickle Cell Disease Respiratory Denies history of Aspiration, Asthma, Chronic Obstructive Pulmonary Disease (COPD), Pneumothorax, Sleep Apnea, Tuberculosis Cardiovascular Denies history of Angina, Arrhythmia, Congestive Heart Failure, Coronary Artery Disease, Deep Vein Thrombosis, Hypertension, Hypotension, Myocardial Infarction, Peripheral Arterial Disease, Peripheral  Venous Disease, Phlebitis, Vasculitis Gastrointestinal Denies history of Cirrhosis , Colitis, Crohn s, Hepatitis A, Hepatitis B, Hepatitis C Endocrine Denies history of Type I Diabetes, Type II Diabetes Genitourinary Denies history of End Stage Renal Disease Immunological Denies history of Lupus Erythematosus, Raynaud s, Scleroderma Integumentary (Skin) Denies history of History of Burn, History of pressure wounds Musculoskeletal Patient has history of Gout - 7 years ago Neurologic Denies history of Dementia, Neuropathy, Quadriplegia, Paraplegia, Seizure Disorder Oncologic Denies history of Received Chemotherapy, Received Radiation Psychiatric Denies history of Anorexia/bulimia, Confinement Anxiety Review of Systems (ROS) Constitutional Symptoms (General Health) Denies complaints or symptoms of Fatigue, Fever, Chills, Marked Weight Change. Ear/Nose/Mouth/Throat Denies complaints or symptoms of Difficult clearing ears, Sinusitis. Hematologic/Lymphatic Denies complaints or symptoms of Bleeding / Clotting Disorders, Human Immunodeficiency Virus. [name], [name] ([id number]) Respiratory Denies complaints or symptoms of Chronic or frequent coughs, Shortness of  Breath. Cardiovascular Denies complaints or symptoms of Chest pain, LE edema. Gastrointestinal Denies complaints or symptoms of Frequent diarrhea, Nausea, Vomiting. Endocrine Denies complaints or symptoms of Hepatitis, Thyroid disease, Polydypsia (Excessive Thirst). Genitourinary Denies complaints or symptoms of Kidney failure/ Dialysis, Incontinence/dribbling. Immunological Denies complaints or symptoms of Hives, Itching. Integumentary (Skin) Complains or has symptoms of Wounds. Denies complaints or symptoms of Bleeding or bruising tendency, Breakdown, Swelling. Neurologic Denies complaints or symptoms of Numbness/parasthesias, Focal/Weakness. Oncologic skin cancer Psychiatric Denies complaints or symptoms of  Anxiety, Claustrophobia. Objective Constitutional sitting or standing blood pressure is within target range for patient.. pulse regular and within target range for patient.Marland Kitchen respirations regular, non- labored and within target range for patient.Marland Kitchen temperature within target range for patient.. Well-nourished and well-hydrated in no acute distress. Vitals Time Taken: 1:15 PM, Temperature: 98.6 F, Pulse: 96 bpm, Respiratory Rate: 20 breaths/min, Blood Pressure: 121/80 mmHg. Eyes conjunctiva clear no eyelid edema noted. pupils equal round and reactive to light and accommodation. Ears, Nose, Mouth, and Throat no gross abnormality of ear auricles or external auditory canals. normal hearing noted during conversation. mucus membranes moist. Respiratory normal breathing without difficulty. Cardiovascular 2+ dorsalis pedis/posterior tibialis pulses. no clubbing, cyanosis, significant edema, Musculoskeletal normal gait and posture. no significant deformity or arthritic changes, no loss or range of motion, no clubbing. Psychiatric this patient is able to make decisions and demonstrates good insight into disease process. Alert and Oriented x 3. pleasant and cooperative. General Notes: Upon inspection patient's wound actually showed signs of some hyper granulation today. There really was not any significant slough buildup which is good news with that being said I am concerned about the fact that obviously with the hypergranulation the new epithelial tissue which does show signs of trying to grow is not really able to grow significantly well and when it does it does not stay attached very well due to the poor granulation underneath. I think we need to try to level things out here to get [name] moving in the correct direction. Also believe that a 3 layer compression wrap will be preferable over the Unna boot wrap. Integumentary (Hair, Skin) Wound #1 status is Open. Original cause of wound was Surgical Injury.  The date acquired was: 05/27/2020. The wound is located on the Left,Medial Lower Leg. The wound measures 2.5cm length x 3.5cm width x 0.2cm depth; 6.872cm^2 area and 1.374cm^3 volume. There is Fat Layer (Subcutaneous Tissue) exposed. There is no tunneling or undermining noted. There is a medium amount of serosanguineous drainage noted. There is large (67-100%) red, hyper - granulation within the wound bed. There is a small (1-33%) amount of necrotic tissue within the wound bed. 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 There was a Three Layer Compression Therapy Procedure with a pre-treatment ABI of 1.3 by Dolan Amen, RN. Post procedure Diagnosis Wound #1: Same as Pre-Procedure Plan Follow-up Appointments: Return Appointment in 1 week. Bathing/ Shower/ Hygiene: May shower with wound dressing protected with water repellent cover or cast protector. Edema Control - Lymphedema / Segmental Compressive Device / Other: Optional: One layer of unna paste to top of compression wrap (to act as an anchor).  3 Layer Compression System for Lymphedema. Elevate legs to the level of the heart and pump ankles as often as possible Elevate leg(s) parallel to the floor when sitting. WOUND #1: - Lower Leg Wound Laterality: Left, Medial Cleanser: Soap and Water 1 x Per Week/15 Days Discharge Instructions: Gently cleanse wound with antibacterial soap, rinse and pat dry prior to dressing wounds Peri-Wound Care: Desitin Maximum Strength Ointment 4 (oz) 1 x Per Week/15 Days Discharge Instructions:  Apply around wound Primary Dressing: Hydrofera Blue Ready Transfer Foam, 2.5x2.5 (in/in) 1 x Per Week/15 Days Discharge Instructions: Apply Hydrofera Blue Ready to wound bed as directed Secondary Dressing: ABD Pad 5x9 (in/in) 1 x Per Week/15 Days Discharge Instructions: Cover with ABD pad Compression Wrap: Profore Lite LF 3 Multilayer Compression Bandaging System 1 x Per Week/15 Days Discharge Instructions: Apply 3 multi-layer wrap as prescribed. 1. Would recommend currently that we initiate treatment with a Hydrofera Blue dressing. We will use some zinc around the edges of the wound followed by an ABD pad to cover. 2. Also recommend initiation of a 3 layer compression wrap which I think will be better than Unna boot wraps for [name] to be honest. 3. I am also can recommend that the patient continue to monitor for any signs of worsening infection such as increased pain hopefully that will not be the case that any of this will occur. We will see patient back for reevaluation in 1 week here in the clinic. If anything worsens or changes patient will contact our office for additional recommendations. Electronic Signature(s) Signed: 09/18/2020 5:24:46 PM By: Worthy Keeler PA-C Entered By: Worthy Keeler on 99/83/3825 05:39:76 [name], [name] ([id number]) -------------------------------------------------------------------------------- ROS/PFSH Details Patient Name: [name] Date of Service: 09/18/2020 1:00 PM Medical Record Number: [id number] Patient Account Number: [id number] Date of Birth/Sex: May 20, 1951 (70 y.o. F) Treating RN: Dolan Amen Primary Care Provider: Miguel Aschoff Other Clinician: Jeanine Luz Referring Provider: Miguel Aschoff Treating Provider/Extender: Skipper Cliche in Treatment: 0 Information Obtained From Patient Constitutional Symptoms (General Health) Complaints and Symptoms: Negative for: Fatigue; Fever; Chills; Marked Weight  Change Ear/Nose/Mouth/Throat Complaints and Symptoms: Negative for: Difficult clearing ears; Sinusitis Medical History: Negative for: Chronic sinus problems/congestion; Middle ear problems Hematologic/Lymphatic Complaints and Symptoms: Negative for: Bleeding / Clotting Disorders; Human Immunodeficiency Virus Medical History: Negative for: Anemia; Hemophilia; Human Immunodeficiency Virus; Lymphedema; Sickle Cell Disease Respiratory Complaints and Symptoms: Negative for: Chronic or frequent coughs; Shortness of Breath Medical History: Negative for: Aspiration; Asthma; Chronic Obstructive Pulmonary Disease (COPD); Pneumothorax; Sleep Apnea; Tuberculosis Cardiovascular Complaints and Symptoms: Negative for: Chest pain; LE edema Medical History: Negative for: Angina; Arrhythmia; Congestive Heart Failure; Coronary Artery Disease; Deep Vein Thrombosis; Hypertension; Hypotension; Myocardial Infarction; Peripheral Arterial Disease; Peripheral Venous Disease; Phlebitis; Vasculitis Gastrointestinal Complaints and Symptoms: Negative for: Frequent diarrhea; Nausea; Vomiting Medical History: Negative for: Cirrhosis ; Colitis; Crohnos; Hepatitis A; Hepatitis B; Hepatitis C Endocrine Complaints and Symptoms: Negative for: Hepatitis; Thyroid disease; Polydypsia (Excessive Thirst) Medical History: Negative for: Type I Diabetes; Type II Diabetes Genitourinary Complaints and Symptoms: Negative for: Kidney failure/ Dialysis; Incontinence/dribbling [name], Ida ([id number]) Medical History: Negative for: End Stage Renal Disease Immunological Complaints and Symptoms: Negative for: Hives; Itching Medical History: Negative for: Lupus Erythematosus; Raynaudos; Scleroderma Integumentary (Skin) Complaints and Symptoms: Positive for: Wounds Negative for: Bleeding or bruising tendency; Breakdown; Swelling Medical History: Negative for: History of Burn; History of pressure  wounds Neurologic Complaints and Symptoms: Negative for: Numbness/parasthesias; Focal/Weakness Medical History: Negative for: Dementia; Neuropathy; Quadriplegia; Paraplegia; Seizure Disorder  Psychiatric Complaints and Symptoms: Negative for: Anxiety; Claustrophobia Medical History: Negative for: Anorexia/bulimia; Confinement Anxiety Eyes Medical History: Positive for: Cataracts Musculoskeletal Medical History: Positive for: Gout - 7 years ago Oncologic Complaints and Symptoms: Review of System Notes: skin cancer Medical History: Negative for: Received Chemotherapy; Received Radiation HBO Extended History Items Eyes: Cataracts Immunizations Pneumococcal Vaccine: Received Pneumococcal Vaccination: Yes Implantable Devices None Family and Social History [name], ALEKSANDRA ([id number]) Never smoker; Marital Status - Married; Alcohol Use: Rarely; Drug Use: No History; Caffeine Use: Daily; Financial Concerns: No; Food, Clothing or Shelter Needs: No; Support System Lacking: No; Transportation Concerns: No Electronic Signature(s) Signed: 09/18/2020 4:38:23 PM By: Jeanine Luz Signed: 09/18/2020 5:09:32 PM By: Georges Mouse, Minus Breeding RN Signed: 09/18/2020 5:44:32 PM By: Worthy Keeler PA-C Entered By: Jeanine Luz on 38/93/7342 87:68:11 [name], [name] Schwartz ([id number]) -------------------------------------------------------------------------------- SuperBill Details Patient Name: [name] Date of Service: 09/18/2020 Medical Record Number: [id number] Patient Account Number: [id number] Date of Birth/Sex: [birth year] (70 y.o. 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, including hip Facility Procedures CPT4 Code: 36468032 Description: 99213 - WOUND CARE VISIT-LEV 3 EST PT Modifier: Quantity: 1 CPT4 Code: 12248250 Description: (Facility Use Only) 29581LT - APPLY MULTLAY COMPRS LWR LT LEG Modifier: Quantity: 1 Physician Procedures CPT4 Code: 0370488 Description: WC PHYS LEVEL 3 o NEW PT Modifier: Quantity: 1 CPT4 Code: Description: ICD-10 Diagnosis Description T81.31XA Disruption of external operation (surgical) wound, not elsewhere classi Q91.694 Non-pressure chronic ulcer of other part of left lower leg with fat lay L03.116 Cellulitis of left lower limb C44.729  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

## 2020-09-18 NOTE — Progress Notes (Signed)
LEMMA, TETRO ([id number]) Visit Report for 09/18/2020 Allergy List Details Patient Name: [name], [name] Date of Service: 09/18/2020 1:00 PM Medical Record Number: [id number] Patient Account Number: [id number] Date of Birth/Sex: [birth year] (69 y.o. F) Treating RN: Dolan Amen Primary Care Inetta Dicke: Miguel Aschoff Other Clinician: Jeanine Luz Referring Kaysi Ourada: Miguel Aschoff Treating Corrinna Karapetyan/Extender: Jeri Cos Weeks in Treatment: 0 Allergies Active Allergies No Known Allergies Allergy Notes Electronic Signature(s) Signed: 09/18/2020 4:38:23 PM By: Jeanine Luz Entered By: Jeanine Luz on 84/69/6295 28:41:32 [place], [name] ([id number]) -------------------------------------------------------------------------------- Arrival Information Details Patient Name: [name] Date of Service: 09/18/2020 1:00 PM Medical Record Number: [id number] Patient Account Number: [id number] Date of Birth/Sex: 08-24-1950 (69 y.o. F) Treating RN: Dolan Amen Primary Care Fredis Malkiewicz: Miguel Aschoff Other Clinician: Jeanine Luz Referring Conley Delisle: Miguel Aschoff Treating Rudolph Dobler/Extender: Skipper Cliche in Treatment: 0 Visit Information Patient Arrived: Ambulatory Arrival Time: 13:14 Accompanied By: self Transfer Assistance: None Patient Identification Verified: Yes Secondary Verification Process Completed: Yes Electronic Signature(s) Signed: 09/18/2020 4:38:23 PM By: Jeanine Luz Entered By: Jeanine Luz on 42/59/5638 75:64:33 [name], [name] ([id number]) -------------------------------------------------------------------------------- Clinic Level of Care Assessment Details Patient Name: [name] Date of Service: 09/18/2020 1:00 PM Medical Record Number: [id number] Patient Account Number: [id number] Date of Birth/Sex: 1951/04/03 (69 y.o. F) Treating RN: Dolan Amen Primary Care Consuello Lassalle: Miguel Aschoff Other Clinician: Jeanine Luz Referring Dmetrius Ambs: Miguel Aschoff Treating Rashawn Rayman/Extender: Skipper Cliche in Treatment: 0 Clinic Level of Care Assessment Items TOOL 3 Quantity Score X - Use when EandM and Procedure is performed on FOLLOW-UP visit 1 0 ASSESSMENTS - Nursing Assessment / Reassessment X - Reassessment of Co-morbidities (includes updates in patient status) 1 10 X- 1 5 Reassessment of Adherence to Treatment Plan ASSESSMENTS - Wound and Skin Assessment / Reassessment []  - Points for Wound Assessment can only be taken for a new wound of unknown or different etiology and a 0 procedure is NOT performed to that wound X- 1 5 Simple Wound Assessment / Reassessment - one wound []  - 0 Complex Wound Assessment / Reassessment - multiple wounds []  - 0 Dermatologic / Skin Assessment (not related to wound area) ASSESSMENTS - Focused Assessment []  - Circumferential Edema Measurements - multi extremities 0 []  - 0 Nutritional Assessment / Counseling / Intervention []  - 0 Lower Extremity Assessment (monofilament, tuning fork, pulses) []  - 0 Peripheral Arterial Disease Assessment (using hand held doppler) ASSESSMENTS - Ostomy and/or Continence Assessment and Care []  - Incontinence Assessment and Management 0 []  - 0 Ostomy Care Assessment and Management (repouching, etc.) PROCESS - Coordination of Care []  - Points for Discharge Coordination can only be taken for a new wound of unknown or different etiology and a 0 procedure is NOT performed to that wound X- 1 15 Simple Patient / Family Education for ongoing care []  - 0 Complex (extensive) Patient / Family Education for ongoing care []  - 0 Staff obtains Programmer, systems, Records, Test Results / Process Orders []  - 0 Staff telephones HHA, Nursing Homes / Clarify orders / etc []  - 0 Routine Transfer to another Facility (non-emergent condition) []  - 0 Routine Hospital Admission (non-emergent condition) []  - 0 New  Admissions / Biomedical engineer / Ordering NPWT, Apligraf, etc. []  - 0 Emergency Hospital Admission (emergent condition) X- 1 10 Simple Discharge Coordination []  - 0 Complex (extensive) Discharge Coordination PROCESS - Special Needs []  - Pediatric / Minor Patient Management 0 []  - 0 Isolation Patient Management []  - 0 Hearing / Language / Visual special needs []  - 0 Assessment of Community  assistance (transportation, D/C planning, etc.) [name], [name] ([id number]) []  - 0 Additional assistance / Altered mentation []  - 0 Support Surface(s) Assessment (bed, cushion, seat, etc.) INTERVENTIONS - Wound Cleansing / Measurement []  - Points for Wound Cleaning / Measurement, Wound Dressing, Specimen Collection and Specimen taken to lab 0 can only be taken for a new wound of unknown or different etiology and a procedure is NOT performed to that wound X- 1 5 Simple Wound Cleansing - one wound []  - 0 Complex Wound Cleansing - multiple wounds X- 1 5 Wound Imaging (photographs - any number of wounds) []  - 0 Wound Tracing (instead of photographs) X- 1 5 Simple Wound Measurement - one wound []  - 0 Complex Wound Measurement - multiple wounds INTERVENTIONS - Wound Dressings []  - Small Wound Dressing one or multiple wounds 0 X- 1 15 Medium Wound Dressing one or multiple wounds []  - 0 Large Wound Dressing one or multiple wounds INTERVENTIONS - Miscellaneous []  - External ear exam 0 []  - 0 Specimen Collection (cultures, biopsies, blood, body fluids, etc.) []  - 0 Specimen(s) / Culture(s) sent or taken to Lab for analysis []  - 0 Patient Transfer (multiple staff / Civil Service fast streamer / Similar devices) []  - 0 Simple Staple / Suture removal (25 or less) []  - 0 Complex Staple / Suture removal (26 or more) []  - 0 Hypo / Hyperglycemic Management (close monitor of Blood Glucose) X- 1 15 Ankle / Brachial Index (ABI) - do not check if billed separately X- 1 5 Vital Signs Has the  patient been seen at the hospital within the last three years: Yes Total Score: 95 Level Of Care: New/Established - Level 3 Electronic Signature(s) Signed: 09/18/2020 5:09:32 PM By: Georges Mouse, Minus Breeding RN Entered By: Georges Mouse, Minus Breeding on 23/76/2831 51:76:16 [name], [name] ([id number]) -------------------------------------------------------------------------------- Compression Therapy Details Patient Name: [name] Date of Service: 09/18/2020 1:00 PM Medical Record Number: [id number] Patient Account Number: [id number] Date of Birth/Sex: Nov 19, 1950 (69 y.o. F) Treating RN: Dolan Amen Primary Care Bryton Waight: Miguel Aschoff Other Clinician: Jeanine Luz Referring Jalyah Weinheimer: Miguel Aschoff Treating Feven Alderfer/Extender: Jeri Cos Weeks in Treatment: 0 Compression Therapy Performed for Wound Assessment: Wound #1 Left,Medial Lower Leg Performed By: Clinician Dolan Amen, RN Compression Type: Three Layer Pre Treatment ABI: 1.3 Post Procedure Diagnosis Same as Pre-procedure Electronic Signature(s) Signed: 09/18/2020 5:09:32 PM By: Georges Mouse, Minus Breeding RN Entered By: Georges Mouse, Minus Breeding on 35/00/9381 82:99:37 [name], [name] ([id number]) -------------------------------------------------------------------------------- Encounter Discharge Information Details Patient Name: [name] Date of Service: 09/18/2020 1:00 PM Medical Record Number: [id number] Patient Account Number: [id number] Date of Birth/Sex: 18-Feb-1951 (69 y.o. F) Treating RN: Dolan Amen Primary Care Felesha Moncrieffe: Miguel Aschoff Other Clinician: Jeanine Luz Referring Connery Shiffler: Miguel Aschoff Treating Dejion Grillo/Extender: Skipper Cliche in Treatment: 0 Encounter Discharge Information Items Discharge Condition: Stable Ambulatory Status: Ambulatory Discharge Destination: Home Transportation: Private Auto Accompanied By: self Schedule Follow-up Appointment:  Yes Clinical Summary of Care: Electronic Signature(s) Signed: 09/18/2020 5:09:32 PM By: Georges Mouse, Minus Breeding RN Entered By: Georges Mouse, Minus Breeding on 85/27/7824 23:53:61 [name], [name] ([id number]) -------------------------------------------------------------------------------- Lower Extremity Assessment Details Patient Name: [name] Date of Service: 09/18/2020 1:00 PM Medical Record Number: [id number] Patient Account Number: [id number] Date of Birth/Sex: Nov 25, 1950 (69 y.o. F) Treating RN: Dolan Amen Primary Care Margurite Duffy: Miguel Aschoff Other Clinician: Jeanine Luz Referring Keanon Bevins: Miguel Aschoff Treating Shenay Torti/Extender: Jeri Cos Weeks in Treatment: 0 Edema Assessment Assessed: Shirlyn Goltz: Yes] [Right: No] [Left: Edema] [Right: :] Calf Left: Right: Point of Measurement: 39 cm From Medial Instep 35.2 cm Ankle  Left: Right: Point of Measurement: 10 cm From Medial Instep 22.1 cm Vascular Assessment Pulses: Dorsalis Pedis Palpable: [Left:Yes] Doppler Audible: [Left:Yes] Posterior Tibial Palpable: [Left:Yes] Doppler Audible: [Left:Yes] Blood Pressure: Brachial: [Left:110] Dorsalis Pedis: 140 Ankle: Posterior Tibial: 90 Ankle Brachial Index: [Left:1.27] Electronic Signature(s) Signed: 09/18/2020 4:38:23 PM By: Jeanine Luz Signed: 09/18/2020 5:09:32 PM By: Georges Mouse, Minus Breeding RN Entered By: Jeanine Luz on 79/07/4095 35:32:99 [name], [name] ([id number]) -------------------------------------------------------------------------------- Multi Wound Chart Details Patient Name: [name] Date of Service: 09/18/2020 1:00 PM Medical Record Number: [id number] Patient Account Number: [id number] Date of Birth/Sex: 02-28-1951 (69 y.o. F) Treating RN: Dolan Amen Primary Care Dakai Braithwaite: Miguel Aschoff Other Clinician: Jeanine Luz Referring Daemyn Gariepy: Miguel Aschoff Treating Donesha Wallander/Extender: Skipper Cliche  in Treatment: 0 Vital Signs Height(in): Pulse(bpm): 96 Weight(lbs): Blood Pressure(mmHg): 121/80 Body Mass Index(BMI): Temperature(F): 98.6 Respiratory Rate(breaths/min): 20 Photos: [N/A:N/A] Wound Location: Left, Medial Lower Leg N/A N/A Wounding Event: Surgical Injury N/A N/A Primary Etiology: Atypical N/A N/A Comorbid History: Cataracts, Gout N/A N/A Date Acquired: 05/27/2020 N/A N/A Weeks of Treatment: 0 N/A N/A Wound Status: Open N/A N/A Measurements L x W x D (cm) 2.5x3.5x0.1 N/A N/A Area (cm) : 6.872 N/A N/A Volume (cm) : 0.687 N/A N/A Classification: Full Thickness Without Exposed N/A N/A Support Structures Exudate Amount: Medium N/A N/A Exudate Type: Serosanguineous N/A N/A Exudate Color: red, brown N/A N/A Granulation Amount: Medium (34-66%) N/A N/A Granulation Quality: Red N/A N/A Necrotic Amount: Medium (34-66%) N/A N/A Exposed Structures: Fat Layer (Subcutaneous Tissue): N/A N/A Yes Fascia: No Tendon: No Muscle: No Joint: No Bone: No Treatment Notes Electronic Signature(s) Signed: 09/18/2020 5:09:32 PM By: Georges Mouse, Minus Breeding RN Entered By: Georges Mouse, Minus Breeding on 21/19/4174 08:14:48 [name], [name] ([id number]) -------------------------------------------------------------------------------- Multi-Disciplinary Care Plan Details Patient Name: [name] Date of Service: 09/18/2020 1:00 PM Medical Record Number: [id number] Patient Account Number: [id number] Date of Birth/Sex: Jan 19, 1951 (69 y.o. F) Treating RN: Dolan Amen Primary Care Arther Heisler: Miguel Aschoff Other Clinician: Jeanine Luz Referring Triston Skare: Miguel Aschoff Treating Damonique Brunelle/Extender: Skipper Cliche in Treatment: 0 Active Inactive Orientation to the Wound Care Program Nursing Diagnoses: Knowledge deficit related to the wound healing center program Goals: Patient/caregiver will verbalize understanding of the [facility] Program Date  Initiated: 09/18/2020 Target Resolution Date: 09/18/2020 Goal Status: Active Interventions: Provide education on orientation to the wound center Notes: Wound/Skin Impairment Nursing Diagnoses: Impaired tissue integrity Goals: Patient/caregiver will verbalize understanding of skin care regimen Date Initiated: 09/18/2020 Target Resolution Date: 09/18/2020 Goal Status: Active Ulcer/skin breakdown will have a volume reduction of 30% by week 4 Date Initiated: 09/18/2020 Target Resolution Date: 10/18/2020 Goal Status: Active Ulcer/skin breakdown will have a volume reduction of 50% by week 8 Date Initiated: 09/18/2020 Target Resolution Date: 11/18/2020 Goal Status: Active Ulcer/skin breakdown will have a volume reduction of 80% by week 12 Date Initiated: 09/18/2020 Target Resolution Date: 12/18/2020 Goal Status: Active Ulcer/skin breakdown will heal within 14 weeks Date Initiated: 09/18/2020 Target Resolution Date: 01/18/2021 Goal Status: Active Interventions: Assess patient/caregiver ability to obtain necessary supplies Assess patient/caregiver ability to perform ulcer/skin care regimen upon admission and as needed Assess ulceration(s) every visit Provide education on ulcer and skin care Treatment Activities: Referred to DME Katerine Morua for dressing supplies : 09/18/2020 Skin care regimen initiated : 09/18/2020 Notes: Electronic Signature(s) Signed: 09/18/2020 5:09:32 PM By: Georges Mouse, Minus Breeding RN Entered By: Georges Mouse, Minus Breeding on 50/27/7412 87:86:76 [name], [name] ([id number]) [name], [name] ([id number]) -------------------------------------------------------------------------------- Pain Assessment Details Patient Name: [name] Date of Service: 09/18/2020 1:00 PM Medical  Record Number: [id number] Patient Account Number: [id number] Date of Birth/Sex: [birth year] (69 y.o. F) Treating RN: Dolan Amen Primary Care Maleni Seyer: Miguel Aschoff Other  Clinician: Jeanine Luz Referring Lacey Wallman: Miguel Aschoff Treating Donicia Druck/Extender: Skipper Cliche in Treatment: 0 Active Problems Location of Pain Severity and Description of Pain Patient Has Paino No Site Locations Rate the pain. Current Pain Level: 0 Pain Management and Medication Current Pain Management: Electronic Signature(s) Signed: 09/18/2020 4:38:23 PM By: Jeanine Luz Signed: 09/18/2020 5:09:32 PM By: Georges Mouse, Minus Breeding RN Entered By: Jeanine Luz on 62/22/9798 92:11:94 [place], [name] ([id number]) -------------------------------------------------------------------------------- Patient/Caregiver Education Details Patient Name: [name] Date of Service: 09/18/2020 1:00 PM Medical Record Number: [id number] Patient Account Number: [id number] Date of Birth/Gender: February 07, 1951 (69 y.o. F) Treating RN: Dolan Amen Primary Care Physician: Miguel Aschoff Other Clinician: Jeanine Luz Referring Physician: Miguel Aschoff Treating Physician/Extender: Skipper Cliche in Treatment: 0 Education Assessment Education Provided To: Patient Education Topics Provided Wound/Skin Impairment: Methods: Explain/Verbal Responses: State content correctly Electronic Signature(s) Signed: 09/18/2020 5:09:32 PM By: Georges Mouse, Minus Breeding RN Entered By: Georges Mouse, Minus Breeding on 02/63/7858 85:02:77 [name], [name] ([id number]) -------------------------------------------------------------------------------- Wound Assessment Details Patient Name: [name] Date of Service: 09/18/2020 1:00 PM Medical Record Number: [id number] Patient Account Number: [id number] Date of Birth/Sex: 1950/09/29 (69 y.o. F) Treating RN: Dolan Amen Primary Care Anevay Campanella: Miguel Aschoff Other Clinician: Jeanine Luz Referring Marti Mclane: Miguel Aschoff Treating Cheick Suhr/Extender: Jeri Cos Weeks in Treatment: 0 Wound Status Wound Number:  1 Primary Etiology: Dehisced Wound Wound Location: Left, Medial Lower Leg Wound Status: Open Wounding Event: Surgical Injury Comorbid History: Cataracts, Gout Date Acquired: 05/27/2020 Weeks Of Treatment: 0 Clustered Wound: No Photos Wound Measurements Length: (cm) 2.5 Width: (cm) 3.5 Depth: (cm) 0.2 Area: (cm) 6.872 Volume: (cm) 1.374 % Reduction in Area: 0% % Reduction in Volume: -100% Tunneling: No Undermining: No Wound Description Classification: Full Thickness Without Exposed Support Structures Exudate Amount: Medium Exudate Type: Serosanguineous Exudate Color: red, brown Foul Odor After Cleansing: No Slough/Fibrino Yes Wound Bed Granulation Amount: Large (67-100%) Exposed Structure Granulation Quality: Red, Hyper-granulation Fascia Exposed: No Necrotic Amount: Small (1-33%) Fat Layer (Subcutaneous Tissue) Exposed: Yes Tendon Exposed: No Muscle Exposed: No Joint Exposed: No Bone Exposed: No Treatment Notes Wound #1 (Lower Leg) Wound Laterality: Left, Medial Cleanser Soap and Water Discharge Instruction: Gently cleanse wound with antibacterial soap, rinse and pat dry prior to dressing wounds Peri-Wound Care Desitin Maximum Strength Ointment 4 (oz) [name], Romana ([id number]) Discharge Instruction: Apply around wound Topical Primary Dressing Hydrofera Blue Ready Transfer Foam, 2.5x2.5 (in/in) Discharge Instruction: Apply Hydrofera Blue Ready to wound bed as directed Secondary Dressing ABD Pad 5x9 (in/in) Discharge Instruction: Cover with ABD pad Secured With Compression Wrap Profore Lite LF 3 Multilayer Compression Bandaging System Discharge Instruction: Apply 3 multi-layer wrap as prescribed. Compression Stockings Add-Ons Electronic Signature(s) Signed: 09/18/2020 5:09:32 PM By: Georges Mouse, Minus Breeding RN Entered By: Georges Mouse, Minus Breeding on 65/46/5035 46:56:81 [name], [name]  ([id number]) -------------------------------------------------------------------------------- Vitals Details Patient Name: [name] Date of Service: 09/18/2020 1:00 PM Medical Record Number: [id number] Patient Account Number: [id number] Date of Birth/Sex: October 11, 1950 (69 y.o. F) Treating RN: Dolan Amen Primary Care Serigne Kubicek: Miguel Aschoff Other Clinician: Jeanine Luz Referring Valkyrie Guardiola: Miguel Aschoff Treating Storm Dulski/Extender: Jeri Cos Weeks in Treatment: 0 Vital Signs Time Taken: 13:15 Temperature (F): 98.6 Pulse (bpm): 96 Respiratory Rate (breaths/min): 20 Blood Pressure (mmHg): 121/80 Reference Range: 80 - 120 mg / dl Electronic Signature(s) Signed: 09/18/2020 4:38:23 PM By: Jeanine Luz Entered By: Jeanine Luz on 09/18/2020 13:15:31

## 2020-09-18 NOTE — Progress Notes (Signed)
RHINA, KRAMME ([id number]) Visit Report for 09/18/2020 Abuse/Suicide Risk Screen Details Patient Name: [name], [name] Date of Service: 09/18/2020 1:00 PM Medical Record Number: [id number] Patient Account Number: [id number] Date of Birth/Sex: March 03, 1951 (70 y.o. F) F) Treating RN: Dolan Amen Primary Care Arlinda Barcelona: Miguel Aschoff Other Clinician: Jeanine Luz Referring Simon Llamas: Miguel Aschoff Treating Uilani Sanville/Extender: Skipper Cliche in Treatment: 0 Abuse/Suicide Risk Screen Items Answer ABUSE RISK SCREEN: Has anyone close to you tried to hurt or harm you recentlyo No Do you feel uncomfortable with anyone in your familyo No Has anyone forced you do things that you didnot want to doo No Electronic Signature(s) Signed: 09/18/2020 4:38:23 PM By: Jeanine Luz Signed: 09/18/2020 5:09:32 PM By: Georges Mouse, Minus Breeding RN Entered By: Jeanine Luz on 00/86/7619 50:93:26 [name], [name] ([id number]) -------------------------------------------------------------------------------- Activities of Daily Living Details Patient Name: [name] Date of Service: 09/18/2020 1:00 PM Medical Record Number: [id number] Patient Account Number: [id number] Date of Birth/Sex: 11/21/1950 (70 y.o. F) Treating RN: Dolan Amen Primary Care Harris Kistler: Miguel Aschoff Other Clinician: Jeanine Luz Referring Joli Koob: Miguel Aschoff Treating Garrett Mitchum/Extender: Skipper Cliche in Treatment: 0 Activities of Daily Living Items Answer Activities of Daily Living (Please select one for each item) Drive Automobile Completely Able Take Medications Completely Able Use Telephone Completely Able Care for Appearance Completely Able Use Toilet Completely Able Bath / Shower Completely Able Dress Self Completely Able Feed Self Completely Able Walk Completely Able Get In / Out Bed Completely Able Housework Completely Able Prepare Meals Completely Able Handle Money  Completely Able Shop for Self Completely Able Electronic Signature(s) Signed: 09/18/2020 4:38:23 PM By: Jeanine Luz Signed: 09/18/2020 5:09:32 PM By: Georges Mouse, Minus Breeding RN Entered By: Jeanine Luz on 97/67/3419 37:90:24 [name], Larhonda ([id number]) -------------------------------------------------------------------------------- Education Screening Details Patient Name: [name] Date of Service: 09/18/2020 1:00 PM Medical Record Number: [id number] Patient Account Number: [id number] Date of Birth/Sex: 1950/11/22 (70 y.o. F) Treating RN: Dolan Amen Primary Care Darsha Zumstein: Miguel Aschoff Other Clinician: Jeanine Luz Referring Kaleiah Kutzer: Miguel Aschoff Treating Shadia Larose/Extender: Skipper Cliche in Treatment: 0 Primary Learner Assessed: Patient Learning Preferences/Education Level/Primary Language Learning Preference: Explanation, Demonstration Highest Education Level: College or Above Preferred Language: English Cognitive Barrier Language Barrier: No Translator Needed: No Memory Deficit: No Emotional Barrier: No Cultural/Religious Beliefs Affecting Medical Care: No Physical Barrier Impaired Vision: Yes Contacts Impaired Hearing: No Decreased Hand dexterity: No Knowledge/Comprehension Knowledge Level: High Comprehension Level: High Ability to understand written instructions: High Ability to understand verbal instructions: High Motivation Anxiety Level: Calm Cooperation: Cooperative Education Importance: Acknowledges Need Interest in Health Problems: Asks Questions Perception: Coherent Willingness to Engage in Self-Management High Activities: Readiness to Engage in Self-Management High Activities: Electronic Signature(s) Signed: 09/18/2020 4:38:23 PM By: Jeanine Luz Signed: 09/18/2020 5:09:32 PM By: Georges Mouse, Minus Breeding RN Entered By: Jeanine Luz on 62/22/9798 92:11:94 [name], [name]  ([id number]) -------------------------------------------------------------------------------- Fall Risk Assessment Details Patient Name: [name] Date of Service: 09/18/2020 1:00 PM Medical Record Number: [id number] Patient Account Number: [id number] Date of Birth/Sex: [birth year] (70 y.o. F) Treating RN: Dolan Amen Primary Care Laine Fonner: Miguel Aschoff Other Clinician: Jeanine Luz Referring Saivon Prowse: Miguel Aschoff Treating Sanna Porcaro/Extender: Skipper Cliche in Treatment: 0 Fall Risk Assessment Items Have you had 2 or more falls in the last 12 monthso 0 No Have you had any fall that resulted in injury in the last 12 monthso 0 No FALLS RISK SCREEN History of falling - immediate or within 3 months 0 No Secondary diagnosis (Do you have 2 or more medical diagnoseso) 0 No Ambulatory aid None/bed  rest/wheelchair/nurse 0 No Crutches/cane/walker 0 No Furniture 0 No Intravenous therapy Access/Saline/Heparin Lock 0 No Gait/Transferring Normal/ bed rest/ wheelchair 0 No Weak (short steps with or without shuffle, stooped but able to lift head while walking, may 0 No seek support from furniture) Impaired (short steps with shuffle, may have difficulty arising from chair, head down, impaired 0 No balance) Mental Status Oriented to own ability 0 Yes Electronic Signature(s) Signed: 09/18/2020 4:38:23 PM By: Jeanine Luz Signed: 09/18/2020 5:09:32 PM By: Georges Mouse, Minus Breeding RN Entered By: Jeanine Luz on 00/93/8182 99:37:16 [name], [name] ([id number]) -------------------------------------------------------------------------------- Nutrition Risk Screening Details Patient Name: [name] Date of Service: 09/18/2020 1:00 PM Medical Record Number: [id number] Patient Account Number: [id number] Date of Birth/Sex: Dec 25, 1950 (70 y.o. F) Treating RN: Dolan Amen Primary Care Rylei Codispoti: Miguel Aschoff Other Clinician: Jeanine Luz Referring  Saanvika Vazques: Miguel Aschoff Treating Alyzza Andringa/Extender: Jeri Cos Weeks in Treatment: 0 Height (in): Weight (lbs): Body Mass Index (BMI): Nutrition Risk Screening Items Score Screening NUTRITION RISK SCREEN: I have an illness or condition that made me change the kind and/or amount of food I eat 0 No I eat fewer than two meals per day 0 No I eat few fruits and vegetables, or milk products 0 No I have three or more drinks of beer, liquor or wine almost every day 0 No I have tooth or mouth problems that make it hard for me to eat 0 No I don't always have enough money to buy the food I need 0 No I eat alone most of the time 0 No I take three or more different prescribed or over-the-counter drugs a day 0 No Without wanting to, I have lost or gained 10 pounds in the last six months 2 Yes I am not always physically able to shop, cook and/or feed myself 0 No Nutrition Protocols Good Risk Protocol 0 No interventions needed Moderate Risk Protocol High Risk Proctocol Risk Level: Good Risk Score: 2 Electronic Signature(s) Signed: 09/18/2020 4:38:23 PM By: Jeanine Luz Signed: 09/18/2020 5:09:32 PM By: Georges Mouse, Minus Breeding RN Entered By: Jeanine Luz on 09/18/2020 13:28:12

## 2020-09-22 DIAGNOSIS — Z961 Presence of intraocular lens: Secondary | ICD-10-CM | POA: Diagnosis not present

## 2020-09-25 ENCOUNTER — Encounter: Payer: Medicare Other | Attending: Physician Assistant | Admitting: Physician Assistant

## 2020-09-25 ENCOUNTER — Other Ambulatory Visit: Payer: Self-pay

## 2020-09-25 DIAGNOSIS — I872 Venous insufficiency (chronic) (peripheral): Secondary | ICD-10-CM | POA: Insufficient documentation

## 2020-09-25 DIAGNOSIS — L97822 Non-pressure chronic ulcer of other part of left lower leg with fat layer exposed: Secondary | ICD-10-CM | POA: Insufficient documentation

## 2020-09-25 DIAGNOSIS — Z85828 Personal history of other malignant neoplasm of skin: Secondary | ICD-10-CM | POA: Insufficient documentation

## 2020-09-25 DIAGNOSIS — Y838 Other surgical procedures as the cause of abnormal reaction of the patient, or of later complication, without mention of misadventure at the time of the procedure: Secondary | ICD-10-CM | POA: Insufficient documentation

## 2020-09-25 DIAGNOSIS — L03116 Cellulitis of left lower limb: Secondary | ICD-10-CM | POA: Diagnosis not present

## 2020-09-25 DIAGNOSIS — T8131XA Disruption of external operation (surgical) wound, not elsewhere classified, initial encounter: Secondary | ICD-10-CM | POA: Insufficient documentation

## 2020-09-25 NOTE — Progress Notes (Addendum)
ELVIS, BOOT ([id number]) Visit Report for 09/25/2020 Chief Complaint Document Details Patient Name: [name], [name] Date of Service: 09/25/2020 12:30 PM Medical Record Number: [id number] Patient Account Number: [id number] Date of Birth/Sex: 1951/05/15 (70 y.o. F) Treating RN: Dolan Amen Primary Care Provider: Miguel Aschoff Other Clinician: Referring Provider: Miguel Aschoff Treating Provider/Extender: Skipper Cliche in Treatment: 1 Information Obtained from: Patient Chief Complaint Left LE Surgical Ulcer Electronic Signature(s) Signed: 09/25/2020 1:08:40 PM By: Worthy Keeler PA-C Entered By: Worthy Keeler on 83/41/9622 29:79:89 [place], [name] ([id number]) -------------------------------------------------------------------------------- Debridement Details Patient Name: [name] Date of Service: 09/25/2020 12:30 PM Medical Record Number: [id number] Patient Account Number: [id number] Date of Birth/Sex: May 12, 1951 (70 y.o. F) Treating RN: Dolan Amen Primary Care Provider: Miguel Aschoff Other Clinician: Referring Provider: Miguel Aschoff Treating Provider/Extender: Skipper Cliche in Treatment: 1 Debridement Performed for Wound #1 Left,Medial Lower Leg Assessment: Performed By: Physician Tommie Sams., PA-C Debridement Type: Debridement Level of Consciousness (Pre- Awake and Alert procedure): Pre-procedure Verification/Time Out Yes - 13:15 Taken: Start Time: 13:15 Pain Control: Lidocaine 4% Topical Solution Total Area Debrided (L x W): 0.3 (cm) x 0.3 (cm) = 0.09 (cm) Tissue and other material Viable, Blood Clots, Subcutaneous debrided: Level: Skin/Subcutaneous Tissue Debridement Description: Excisional Instrument: Curette Bleeding: Minimum Hemostasis Achieved: Pressure Response to Treatment: Procedure was tolerated well Level of Consciousness (Post- Awake and Alert procedure): Post Debridement Measurements of  Total Wound Length: (cm) 1.9 Width: (cm) 3 Depth: (cm) 0.3 Volume: (cm) 1.343 Character of Wound/Ulcer Post Debridement: Stable Post Procedure Diagnosis Same as Pre-procedure Electronic Signature(s) Signed: 09/25/2020 5:35:33 PM By: Charlett Nose RN Signed: 09/25/2020 6:06:53 PM By: Worthy Keeler PA-C Entered By: Georges Mouse, Minus Breeding on 31/49/7026 37:85:88 [name], [name] ([id number]) -------------------------------------------------------------------------------- HPI Details Patient Name: [name] Date of Service: 09/25/2020 12:30 PM Medical Record Number: [id number] Patient Account Number: [id number] Date of Birth/Sex: Jan 08, 1951 (70 y.o. F) Treating RN: Dolan Amen Primary Care Provider: Miguel Aschoff Other Clinician: Referring Provider: Miguel Aschoff Treating Provider/Extender: Skipper Cliche in Treatment: 1 History of Present Illness HPI Description: 09/18/2020 upon evaluation today patient presents for initial inspection here in clinic concerning issues that she has been having unfortunately with a wound on [name] which has been present actually since December 7 when she had a squamous cell carcinoma removed. Subsequently I did review the pictures that she had on [name] of the carcinoma site post excision and it was quite significant. With that being said the tension that was needed to pull the edges of the wound together to approximate apparently caused some issues as far as appropriate healing was concerned and this became necrotic and unfortunately dehisced. Subsequently the patient has since been undergoing treatment at the [facility] where to be honest they have been attempting as much as possible to manage both infections as well as the overall swelling of the leg. They have been placing the patient in Unna boots for the past 3 weeks. Subsequently she also did have a culture which was on 08/27/2020 that  showed Enterococcus and then she was on doxycycline at that point. Subsequently she was switched to amoxicillin based on the culture on 09/02/2020 she is done with that at this point. With that being said the patient is very frustrated with how long this has been going on she notes that today during our conversation. This was a squamous cell carcinoma that led to this need for surgery. 09/25/2020 upon evaluation today patient appears to  be doing well with regard to [name]. She is tolerating the dressing changes without complication fortunately there does not appear to be any signs of infection which is great news. No fevers, chills, nausea, vomiting, or diarrhea. She is going to require little bit of debridement today in regard to an area that is somewhat appearing to be a hematoma in the upper portion of the wound this is very tiny but nonetheless does seem to have some fluid collecting in this area. Electronic Signature(s) Signed: 09/25/2020 1:23:29 PM By: Worthy Keeler PA-C Entered By: Worthy Keeler on 94/17/4081 44:81:85 [name], Jesse ([id number]) -------------------------------------------------------------------------------- Physical Exam Details Patient Name: [name] Date of Service: 09/25/2020 12:30 PM Medical Record Number: [id number] Patient Account Number: [id number] Date of Birth/Sex: 01-28-1951 (70 y.o. F) Treating RN: Dolan Amen Primary Care Provider: Miguel Aschoff Other Clinician: Referring Provider: Miguel Aschoff Treating Provider/Extender: Jeri Cos Weeks in Treatment: 1 Constitutional Well-nourished and well-hydrated in no acute distress. Respiratory normal breathing without difficulty. Psychiatric this patient is able to make decisions and demonstrates good insight into disease process. Alert and Oriented x 3. pleasant and cooperative. Notes Upon inspection patient's wound bed actually showed signs of good granulation epithelization I  am feeling much better about the hyper granulation I think she is doing quite well. I did perform debridement in the upper portion of the wound and this is a small region that is roughly 3 mm in diameter with about 3 mm depth I did have to debride this away it was a small hematoma nonetheless clear that away everything looks to be great underneath and I think she will heal quite nicely. Electronic Signature(s) Signed: 09/25/2020 1:23:54 PM By: Worthy Keeler PA-C Entered By: Worthy Keeler on 77/41/2878 67:67:20 [name], [name] ([id number]) -------------------------------------------------------------------------------- Physician Orders Details Patient Name: [name] Date of Service: 09/25/2020 12:30 PM Medical Record Number: [id number] Patient Account Number: [id number] Date of Birth/Sex: [birth year] (70 y.o. 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 Bathing/ Shower/ Hygiene o May shower with wound dressing protected with water repellent cover or cast protector. Edema Control - Lymphedema / Segmental Compressive Device / Other Left Lower Extremity o Optional: One layer of unna paste to top of compression wrap (to act as an anchor). o 3 Layer Compression System for Lymphedema. o Elevate legs to the level of the heart and pump ankles as often as possible o Elevate leg(s) parallel to the  floor when sitting. Wound Treatment Wound #1 - Lower Leg Wound Laterality: Left, Medial Cleanser: Soap and Water 1 x Per Week/15 Days Discharge Instructions: Gently cleanse wound with antibacterial soap, rinse and pat dry prior to dressing wounds Peri-Wound Care: Desitin Maximum Strength Ointment 4 (oz) 1 x Per Week/15 Days Discharge Instructions: Apply around wound Primary Dressing: Hydrofera Blue Ready Transfer Foam, 2.5x2.5 (in/in) 1 x Per Week/15 Days Discharge Instructions: Tuck a small piece in depth at 12 o'clock, apply Hydrofera Blue Ready to wound bed on top Secondary Dressing: ABD Pad 5x9 (in/in) 1 x Per Week/15 Days  Discharge Instructions: Cover with ABD pad Compression Wrap: Profore Lite LF 3 Multilayer Compression Bandaging System 1 x Per Week/15 Days Discharge Instructions: Apply 3 multi-layer wrap as prescribed. Electronic Signature(s) Signed: 09/25/2020 5:35:33 PM By: Georges Mouse, Minus Breeding RN Signed: 09/25/2020 6:06:53 PM By: Worthy Keeler PA-C Entered By: Georges Mouse, Minus Breeding on 26/37/8588 50:27:74 [name], [name] ([id number]) -------------------------------------------------------------------------------- Problem List Details Patient Name: [name] Date of Service: 09/25/2020 12:30 PM Medical Record Number: [id number] Patient Account Number: [id number] Date of Birth/Sex: Apr 26, 1951 (70 y.o. F) Treating RN: Dolan Amen Primary Care Provider: Miguel Aschoff Other Clinician: Referring Provider: Miguel Aschoff Treating Provider/Extender: Skipper Cliche in Treatment: 1 Active Problems ICD-10 Encounter Code Description Active Date MDM Diagnosis T81.31XA Disruption of external operation (surgical) wound, not elsewhere 09/18/2020 No Yes classified, initial encounter L97.822 Non-pressure chronic ulcer of other part of left lower leg with fat layer 09/18/2020 No Yes exposed L03.116 Cellulitis of left lower limb 09/18/2020 No Yes C44.729 Squamous  cell carcinoma of skin of left lower limb, including hip 09/18/2020 No Yes Inactive Problems Resolved Problems Electronic Signature(s) Signed: 09/25/2020 1:08:25 PM By: Worthy Keeler PA-C Entered By: Worthy Keeler on 83/66/2947 65:46:50 [name], Nyaira ([id number]) -------------------------------------------------------------------------------- Progress Note Details Patient Name: [name] Date of Service: 09/25/2020 12:30 PM Medical Record Number: [id number] Patient Account Number: [id number] Date of Birth/Sex: 12-Feb-1951 (70 y.o. F) Treating RN: Dolan Amen Primary Care Provider: Miguel Aschoff Other Clinician: Referring Provider: Miguel Aschoff Treating Provider/Extender: Skipper Cliche in Treatment: 1 Subjective Chief Complaint Information obtained from Patient Left LE Surgical Ulcer History of Present Illness (HPI) 09/18/2020 upon evaluation today patient presents for initial inspection here in clinic concerning issues that she has been having unfortunately with a wound on [name] which has been present actually since December 7 when she had a squamous cell carcinoma removed. Subsequently I did review the pictures that she had on [name] of the carcinoma site post excision and it was quite significant. With that being said the tension that was needed to pull the edges of the wound together to approximate apparently caused some issues as far as appropriate healing was concerned and this became necrotic and unfortunately dehisced. Subsequently the patient has since been undergoing treatment at the [facility] where to be honest they have been attempting as much as possible to manage both infections as well as the overall swelling of the leg. They have been placing the patient in Unna boots for the past 3 weeks. Subsequently she also did have a culture which was on 08/27/2020 that showed Enterococcus and then she was on doxycycline  at that point. Subsequently she was switched to amoxicillin based on the culture on 09/02/2020 she is done with that at this point. With that being said the patient is very frustrated with how long this has been going on she notes that today during our conversation. This was a squamous cell carcinoma that led to this need for surgery. 09/25/2020 upon evaluation today patient appears to be doing well with regard to [name]. She is tolerating the dressing changes without complication fortunately there does not appear to be any signs of infection which is great news. No fevers, chills, nausea, vomiting, or diarrhea. She is going to require little bit of debridement today in regard to an area that is somewhat appearing to be a hematoma in the upper portion of the wound this is very tiny but nonetheless does seem to have some fluid collecting  in this area. Objective Constitutional Well-nourished and well-hydrated in no acute distress. Vitals Time Taken: 12:48 PM, Temperature: 98.8 F, Pulse: 76 bpm, Respiratory Rate: 16 breaths/min, Blood Pressure: 112/75 mmHg. Respiratory normal breathing without difficulty. Psychiatric this patient is able to make decisions and demonstrates good insight into disease process. Alert and Oriented x 3. pleasant and cooperative. General Notes: Upon inspection patient's wound bed actually showed signs of good granulation epithelization I am feeling much better about the hyper granulation I think she is doing quite well. I did perform debridement in the upper portion of the wound and this is a small region that is roughly 3 mm in diameter with about 3 mm depth I did have to debride this away it was a small hematoma nonetheless clear that away everything looks to be great underneath and I think she will heal quite nicely. Integumentary (Hair, Skin) Wound #1 status is Open. Original cause of wound was Surgical Injury. The date acquired was: 05/27/2020. The wound has been in  treatment 1 weeks. The wound is located on the Left,Medial Lower Leg. The wound measures 1.9cm length x 3cm width x 0.2cm depth; 4.477cm^2 area and 0.895cm^3 volume. There is Fat Layer (Subcutaneous Tissue) exposed. There is no tunneling or undermining noted. There is a medium amount of serosanguineous drainage noted. There is large (67-100%) red, hyper - granulation within the wound bed. There is a small (1-33%) amount of necrotic tissue within the wound bed including Adherent Slough. 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 There was a Excisional Skin/Subcutaneous Tissue Debridement with a total area of 0.09 sq cm performed by Tommie Sams., PA-C. With the following instrument(s): Curette to remove Viable tissue/material. Material removed includes Blood Clots and Subcutaneous Tissue and after achieving pain control using Lidocaine 4% Topical Solution. A time out was conducted at 13:15, prior to the start of the procedure. A Minimum amount of bleeding was controlled with Pressure. The procedure was tolerated well. Post Debridement Measurements: 1.9cm length x 3cm width x 0.3cm depth; 1.343cm^3 volume. Character of Wound/Ulcer Post Debridement is stable. Post procedure Diagnosis Wound #1: Same as Pre-Procedure Pre-procedure diagnosis of Wound #1 is a Dehisced Wound located on the Left,Medial Lower Leg . There was a Three Layer Compression Therapy Procedure with a pre-treatment ABI of 1.3 by Dolan Amen, RN. Post procedure Diagnosis Wound #1: Same as Pre-Procedure Plan Follow-up  Appointments: Return Appointment in 1 week. Bathing/ Shower/ Hygiene: May shower with wound dressing protected with water repellent cover or cast protector. Edema Control - Lymphedema / Segmental Compressive Device / Other: Optional: One layer of unna paste to top of compression wrap (to act as an anchor). 3 Layer Compression System for Lymphedema. Elevate legs to the level of the heart and pump ankles as often as possible Elevate leg(s) parallel to the floor when sitting. WOUND #1: - Lower Leg Wound Laterality: Left, Medial Cleanser: Soap and Water 1 x Per Week/15 Days Discharge Instructions: Gently cleanse wound with antibacterial soap, rinse and pat dry prior to dressing wounds Peri-Wound Care: Desitin Maximum Strength Ointment 4 (oz) 1 x Per Week/15 Days Discharge Instructions: Apply around wound Primary Dressing:  Hydrofera Blue Ready Transfer Foam, 2.5x2.5 (in/in) 1 x Per Week/15 Days Discharge Instructions: Tuck a small piece in depth at 12 o'clock, apply Hydrofera Blue Ready to wound bed on top Secondary Dressing: ABD Pad 5x9 (in/in) 1 x Per Week/15 Days Discharge Instructions: Cover with ABD pad Compression Wrap: Profore Lite LF 3 Multilayer Compression Bandaging System 1 x Per Week/15 Days Discharge Instructions: Apply 3 multi-layer wrap as prescribed. 1. Would recommend that we going to continue with the wound care measures as before and the patient is in agreement the plan that includes the use of the [facility]. 2. Also can continue to use the zinc around the edges of the wound for protection. 3. I am also can recommend that a 3 layer compression wrap be continued I think that is doing a great job and overall elevate your leg will also be of benefit. We will see patient back for reevaluation in 1 week here in the clinic. If anything worsens or changes patient will contact our office for additional recommendations. Electronic Signature(s) Signed: 09/25/2020 1:24:16 PM By:  Worthy Keeler PA-C Entered By: Worthy Keeler on 09/02/9456 59:29:24 [name], [name] ([id number]) -------------------------------------------------------------------------------- SuperBill Details Patient Name: [name] Date of Service: 09/25/2020 Medical Record Number: [id number] Patient Account Number: [id number] Date of Birth/Sex: 1951/06/11 (70 y.o. 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, including hip Facility Procedures CPT4 Code: [id number] Description: 11042 - DEB SUBQ TISSUE 20 SQ CM/< Modifier: Quantity: 1 CPT4 Code: Description: ICD-10 Diagnosis Description T81.31XA Disruption of external operation (surgical) wound, not elsewhere classifie Modifier: d, initial encounter Quantity: Physician Procedures CPT4 Code: 1916606 Description: 11042 - WC PHYS SUBQ TISS 20 SQ CM Modifier: Quantity: 1 CPT4 Code: Description: ICD-10 Diagnosis Description T81.31XA Disruption of external operation (surgical) wound, not elsewhere classifie Modifier: d, initial encounter Quantity: Electronic Signature(s) Signed: 09/25/2020 1:24:27 PM By: Worthy Keeler PA-C Entered By: Worthy Keeler on 09/25/2020 13:24:26

## 2020-09-25 NOTE — Progress Notes (Signed)
[name], [name] ([id number]) Visit Report for 09/25/2020 Arrival Information Details Patient Name: [name], [name] Date of Service: 09/25/2020 12:30 PM Medical Record Number: [id number] Patient Account Number: [id number] Date of Birth/Sex: Nov 24, 1950 (69 y.o. F) Treating RN: Donnamarie Poag Primary Care Faiz Weber: Miguel Aschoff Other Clinician: Referring Lanah Steines: Miguel Aschoff Treating Ouita Nish/Extender: Skipper Cliche in Treatment: 1 Visit Information History Since Last Visit Added or deleted any medications: No Patient Arrived: Ambulatory Had a fall or experienced change in No Arrival Time: 12:47 activities of daily living that may affect Accompanied By: self risk of falls: Transfer Assistance: None Hospitalized since last visit: No Patient Identification Verified: Yes Has Dressing in Place as Prescribed: Yes Secondary Verification Process Completed: Yes Has Compression in Place as Prescribed: Yes Pain Present Now: No Electronic Signature(s) Signed: 09/25/2020 5:09:19 PM By: Donnamarie Poag Entered By: Donnamarie Poag on 08/65/7846 96:29:52 [name], [name] ([id number]) -------------------------------------------------------------------------------- Clinic Level of Care Assessment Details Patient Name: [name] Date of Service: 09/25/2020 12:30 PM Medical Record Number: [id number] Patient Account Number: [id number] Date of Birth/Sex: [birth year] (69 y.o. F) Treating RN: Dolan Amen Primary Care Fraser Busche: Miguel Aschoff Other Clinician: Referring Leontina Skidmore: Miguel Aschoff Treating Aundrey Elahi/Extender: Skipper Cliche in Treatment: 1 Clinic Level of Care Assessment Items TOOL 1 Quantity Score []  - Use when EandM and Procedure is performed on INITIAL visit 0 ASSESSMENTS - Nursing Assessment / Reassessment []  - General Physical Exam (combine w/ comprehensive assessment (listed just below) when performed on new 0 pt. evals) []  - 0 Comprehensive  Assessment (HX, ROS, Risk Assessments, Wounds Hx, etc.) ASSESSMENTS - Wound and Skin Assessment / Reassessment []  - Dermatologic / Skin Assessment (not related to wound area) 0 ASSESSMENTS - Ostomy and/or Continence Assessment and Care []  - Incontinence Assessment and Management 0 []  - 0 Ostomy Care Assessment and Management (repouching, etc.) PROCESS - Coordination of Care []  - Simple Patient / Family Education for ongoing care 0 []  - 0 Complex (extensive) Patient / Family Education for ongoing care []  - 0 Staff obtains Programmer, systems, Records, Test Results / Process Orders []  - 0 Staff telephones HHA, Nursing Homes / Clarify orders / etc []  - 0 Routine Transfer to another Facility (non-emergent condition) []  - 0 Routine Hospital Admission (non-emergent condition) []  - 0 New Admissions / Biomedical engineer / Ordering NPWT, Apligraf, etc. []  - 0 Emergency Hospital Admission (emergent condition) PROCESS - Special Needs []  - Pediatric / Minor Patient Management 0 []  - 0 Isolation Patient Management []  - 0 Hearing / Language / Visual special needs []  - 0 Assessment of Community assistance (transportation, D/C planning, etc.) []  - 0 Additional assistance / Altered mentation []  - 0 Support Surface(s) Assessment (bed, cushion, seat, etc.) INTERVENTIONS - Miscellaneous []  - External ear exam 0 []  - 0 Patient Transfer (multiple staff / Civil Service fast streamer / Similar devices) []  - 0 Simple Staple / Suture removal (25 or less) []  - 0 Complex Staple / Suture removal (26 or more) []  - 0 Hypo/Hyperglycemic Management (do not check if billed separately) []  - 0 Ankle / Brachial Index (ABI) - do not check if billed separately Has the patient been seen at the hospital within the last three years: Yes Total Score: 0 Level Of Care: ____ [name] ([id number]) Electronic Signature(s) Signed: 09/25/2020 5:35:33 PM By: Georges Mouse, Minus Breeding RN Entered By: Georges Mouse, Minus Breeding on  56/38/7564 33:29:51 [name], [name] ([id number]) -------------------------------------------------------------------------------- Compression Therapy Details Patient Name: [name] Date of Service: 09/25/2020 12:30 PM Medical Record Number: [id number] Patient Account Number: [id number]  Date of Birth/Sex: 1950-11-04 (70 y.o. F) Treating RN: Dolan Amen Primary Care Jaesean Litzau: Miguel Aschoff Other Clinician: Referring Philemon Riedesel: Miguel Aschoff Treating Ly Bacchi/Extender: Jeri Cos Weeks in Treatment: 1 Compression Therapy Performed for Wound Assessment: Wound #1 Left,Medial Lower Leg Performed By: Clinician Dolan Amen, RN Compression Type: Three Layer Pre Treatment ABI: 1.3 Post Procedure Diagnosis Same as Pre-procedure Electronic Signature(s) Signed: 09/25/2020 5:35:33 PM By: Georges Mouse, Minus Breeding RN Entered By: Georges Mouse, Minus Breeding on 31/54/0086 76:19:50 [name], [name] ([id number]) -------------------------------------------------------------------------------- Encounter Discharge Information Details Patient Name: [name] Date of Service: 09/25/2020 12:30 PM Medical Record Number: [id number] Patient Account Number: [id number] Date of Birth/Sex: Apr 21, 1951 (69 y.o. F) Treating RN: Dolan Amen Primary Care Emersyn Kotarski: Miguel Aschoff Other Clinician: Referring Channon Brougher: Miguel Aschoff Treating Lillie Portner/Extender: Skipper Cliche in Treatment: 1 Encounter Discharge Information Items Post Procedure Vitals Discharge Condition: Stable Unable to obtain vitals Reason: limited time Ambulatory Status: Ambulatory Discharge Destination: Home Transportation: Private Auto Accompanied By: self Schedule Follow-up Appointment: Yes Clinical Summary of Care: Electronic Signature(s) Signed: 09/25/2020 5:35:33 PM By: Georges Mouse, Minus Breeding RN Entered By: Georges Mouse, Minus Breeding on 50/53/9767 34:19:37 [name], [name]  ([id number]) -------------------------------------------------------------------------------- Lower Extremity Assessment Details Patient Name: [name] Date of Service: 09/25/2020 12:30 PM Medical Record Number: [id number] Patient Account Number: [id number] Date of Birth/Sex: Jul 03, 1950 (69 y.o. F) Treating RN: Donnamarie Poag Primary Care Makaylee Spielberg: Miguel Aschoff Other Clinician: Referring Ayn Domangue: Miguel Aschoff Treating Cael Worth/Extender: Jeri Cos Weeks in Treatment: 1 Edema Assessment Assessed: [Left: Yes] [Right: No] Edema: [Left: Ye] [Right: s] Calf Left: Right: Point of Measurement: 39 cm From Medial Instep 36 cm Ankle Left: Right: Point of Measurement: 10 cm From Medial Instep 23 cm Vascular Assessment Pulses: Dorsalis Pedis Palpable: [Left:Yes] Electronic Signature(s) Signed: 09/25/2020 5:09:19 PM By: Donnamarie Poag Entered By: Donnamarie Poag on 34/19/6222 97:98:92 [name], [name] ([id number]) -------------------------------------------------------------------------------- Multi Wound Chart Details Patient Name: [name] Date of Service: 09/25/2020 12:30 PM Medical Record Number: [id number] Patient Account Number: [id number] Date of Birth/Sex: 08/20/1950 (69 y.o. F) Treating RN: Dolan Amen Primary Care Rayce Brahmbhatt: Miguel Aschoff Other Clinician: Referring Elvy Mclarty: Miguel Aschoff Treating Sonda Coppens/Extender: Skipper Cliche in Treatment: 1 Vital Signs Height(in): Pulse(bpm): 60 Weight(lbs): Blood Pressure(mmHg): 112/75 Body Mass Index(BMI): Temperature(F): 98.8 Respiratory Rate(breaths/min): 16 Photos: [N/A:N/A] Wound Location: Left, Medial Lower Leg N/A N/A Wounding Event: Surgical Injury N/A N/A Primary Etiology: Dehisced Wound N/A N/A Comorbid History: Cataracts, Gout N/A N/A Date Acquired: 05/27/2020 N/A N/A Weeks of Treatment: 1 N/A N/A Wound Status: Open N/A N/A Measurements L x W x D (cm) 1.9x3x0.2 N/A N/A Area  (cm) : 4.477 N/A N/A Volume (cm) : 0.895 N/A N/A % Reduction in Area: 34.90% N/A N/A % Reduction in Volume: 34.90% N/A N/A Classification: Full Thickness Without Exposed N/A N/A Support Structures Exudate Amount: Medium N/A N/A Exudate Type: Serosanguineous N/A N/A Exudate Color: red, brown N/A N/A Granulation Amount: Large (67-100%) N/A N/A Granulation Quality: Red, Hyper-granulation N/A N/A Necrotic Amount: Small (1-33%) N/A N/A Exposed Structures: Fat Layer (Subcutaneous Tissue): N/A N/A Yes Fascia: No Tendon: No Muscle: No Joint: No Bone: No Epithelialization: Small (1-33%) N/A N/A Treatment Notes Electronic Signature(s) Signed: 09/25/2020 5:35:33 PM By: Georges Mouse, Minus Breeding RN Entered By: Georges Mouse, Minus Breeding on 14/97/0263 78:58:85 [name], [name] ([id number]) -------------------------------------------------------------------------------- Multi-Disciplinary Care Plan Details Patient Name: [name] Date of Service: 09/25/2020 12:30 PM Medical Record Number: [id number] Patient Account Number: [id number] Date of Birth/Sex: 1950-12-29 (69 y.o. F) Treating RN: Dolan Amen Primary Care Juliani Laduke: Miguel Aschoff Other Clinician: Referring Jersey Ravenscroft: Miguel Aschoff  Treating Anise Harbin/Extender: Allen Derry Weeks in Treatment: 1 Active Inactive Wound/Skin Impairment Nursing Diagnoses: Impaired tissue integrity Goals: Patient/caregiver will verbalize understanding of skin care regimen Date Initiated: 09/18/2020 Date Inactivated: 09/25/2020 Target Resolution Date: 09/18/2020 Goal Status: Met Ulcer/skin breakdown will have a volume reduction of 30% by week 4 Date Initiated: 09/18/2020 Target Resolution Date: 10/18/2020 Goal Status: Active Ulcer/skin breakdown will have a volume reduction of 50% by week 8 Date Initiated: 09/18/2020 Target Resolution Date: 11/18/2020 Goal Status: Active Ulcer/skin breakdown will have a volume reduction of 80% by week  12 Date Initiated: 09/18/2020 Target Resolution Date: 12/18/2020 Goal Status: Active Ulcer/skin breakdown will heal within 14 weeks Date Initiated: 09/18/2020 Target Resolution Date: 01/18/2021 Goal Status: Active Interventions: Assess patient/caregiver ability to obtain necessary supplies Assess patient/caregiver ability to perform ulcer/skin care regimen upon admission and as needed Assess ulceration(s) every visit Provide education on ulcer and skin care Treatment Activities: Referred to DME Adel Neyer for dressing supplies : 09/18/2020 Skin care regimen initiated : 09/18/2020 Notes: Electronic Signature(s) Signed: 09/25/2020 5:35:33 PM By: Phillis Haggis, Dondra Prader RN Entered By: Phillis Haggis, Dondra Prader on 09/25/2020 13:12:46 [name], [name] ([id number]) -------------------------------------------------------------------------------- Pain Assessment Details Patient Name: [name] Date of Service: 09/25/2020 12:30 PM Medical Record Number: [id number] Patient Account Number: [id number] Date of Birth/Sex: Mar 30, 1951 (69 y.o. F) Treating RN: Hansel Feinstein Primary Care Timm Bonenberger: Julieanne Manson Other Clinician: Referring Lolamae Voisin: Julieanne Manson Treating Melaina Howerton/Extender: Rowan Blase in Treatment: 1 Active Problems Location of Pain Severity and Description of Pain Patient Has Paino No Site Locations Rate the pain. Current Pain Level: 0 Pain Management and Medication Current Pain Management: Electronic Signature(s) Signed: 09/25/2020 5:09:19 PM By: Hansel Feinstein Entered By: Hansel Feinstein on 09/25/2020 12:51:27 [name], [name] ([id number]) -------------------------------------------------------------------------------- Patient/Caregiver Education Details Patient Name: [name] Date of Service: 09/25/2020 12:30 PM Medical Record Number: [id number] Patient Account Number: [id number] Date of Birth/Gender: 01-20-1951 (69 y.o. F) Treating RN: Rogers Blocker Primary Care Physician: Julieanne Manson Other Clinician: Referring Physician: Julieanne Manson Treating Physician/Extender: Rowan Blase in Treatment: 1 Education Assessment Education Provided To: Patient Education Topics Provided Wound/Skin Impairment: Methods: Explain/Verbal Responses: State content correctly Electronic Signature(s) Signed: 09/25/2020 5:35:33 PM By: Phillis Haggis, Dondra Prader RN Entered By: Phillis Haggis, Dondra Prader on 09/25/2020 13:21:19 [name], [name] ([id number]) -------------------------------------------------------------------------------- Wound Assessment Details Patient Name: [name] Date of Service: 09/25/2020 12:30 PM Medical Record Number: [id number] Patient Account Number: [id number] Date of Birth/Sex: 1951-02-05 (69 y.o. F) Treating RN: Hansel Feinstein Primary Care Devonda Pequignot: Julieanne Manson Other Clinician: Referring Dinesh Ulysse: Julieanne Manson Treating Travis Mastel/Extender: Allen Derry Weeks in Treatment: 1 Wound Status Wound Number: 1 Primary Etiology: Dehisced Wound Wound Location: Left, Medial Lower Leg Wound Status: Open Wounding Event: Surgical Injury Comorbid History: Cataracts, Gout Date Acquired: 05/27/2020 Weeks Of Treatment: 1 Clustered Wound: No Photos Wound Measurements Length: (cm) 1.9 Width: (cm) 3 Depth: (cm) 0.2 Area: (cm) 4.477 Volume: (cm) 0.895 % Reduction in Area: 34.9% % Reduction in Volume: 34.9% Epithelialization: Small (1-33%) Tunneling: No Undermining: No Wound Description Classification: Full Thickness Without Exposed Support Structu Exudate Amount: Medium Exudate Type: Serosanguineous Exudate Color: red, brown res Foul Odor After Cleansing: No Slough/Fibrino Yes Wound Bed Granulation Amount: Large (67-100%) Exposed Structure Granulation Quality: Red, Hyper-granulation Fascia Exposed: No Necrotic Amount: Small (1-33%) Fat Layer (Subcutaneous Tissue) Exposed: Yes Necrotic Quality:  Adherent Slough Tendon Exposed: No Muscle Exposed: No Joint Exposed: No Bone Exposed: No Treatment Notes Wound #1 (Lower Leg) Wound Laterality: Left, Medial Cleanser Soap and Water Discharge Instruction: Gently cleanse wound with  antibacterial soap, rinse and pat dry prior to dressing wounds Peri-Wound Care Desitin Maximum Strength Ointment 4 (oz) [name], Stephanee ([id number]) Discharge Instruction: Apply around wound Topical Primary Dressing Hydrofera Blue Ready Transfer Foam, 2.5x2.5 (in/in) Discharge Instruction: Tuck a small piece in depth at 12 o'clock, apply Hydrofera Blue Ready to wound bed on top Secondary Dressing ABD Pad 5x9 (in/in) Discharge Instruction: Cover with ABD pad Secured With Compression Wrap Profore Lite LF 3 Multilayer Compression Natural Steps Discharge Instruction: Apply 3 multi-layer wrap as prescribed. Compression Stockings Add-Ons Electronic Signature(s) Signed: 09/25/2020 5:09:19 PM By: Donnamarie Poag Entered By: Donnamarie Poag on 33/43/5686 16:83:72 [name], Chika ([id number]) -------------------------------------------------------------------------------- Vitals Details Patient Name: [name] Date of Service: 09/25/2020 12:30 PM Medical Record Number: [id number] Patient Account Number: [id number] Date of Birth/Sex: September 27, 1950 (69 y.o. F) Treating RN: Donnamarie Poag Primary Care Maanya Hippert: Miguel Aschoff Other Clinician: Referring [place] Dost: Miguel Aschoff Treating Cythina Mickelsen/Extender: Skipper Cliche in Treatment: 1 Vital Signs Time Taken: 12:48 Temperature (F): 98.8 Pulse (bpm): 76 Respiratory Rate (breaths/min): 16 Blood Pressure (mmHg): 112/75 Reference Range: 80 - 120 mg / dl Electronic Signature(s) Signed: 09/25/2020 5:09:19 PM By: Donnamarie Poag Entered ByDonnamarie Poag on 09/25/2020 12:50:52

## 2020-09-30 ENCOUNTER — Encounter: Payer: Self-pay | Admitting: Dermatology

## 2020-10-02 ENCOUNTER — Ambulatory Visit (INDEPENDENT_AMBULATORY_CARE_PROVIDER_SITE_OTHER): Payer: Medicare Other | Admitting: Family Medicine

## 2020-10-02 ENCOUNTER — Encounter: Payer: Self-pay | Admitting: Family Medicine

## 2020-10-02 ENCOUNTER — Other Ambulatory Visit: Payer: Self-pay

## 2020-10-02 ENCOUNTER — Encounter: Payer: Medicare Other | Admitting: Physician Assistant

## 2020-10-02 VITALS — BP 111/76 | HR 87 | Temp 97.9°F | Resp 18 | Ht 65.0 in | Wt 179.0 lb

## 2020-10-02 DIAGNOSIS — T8131XA Disruption of external operation (surgical) wound, not elsewhere classified, initial encounter: Secondary | ICD-10-CM | POA: Diagnosis not present

## 2020-10-02 DIAGNOSIS — E7849 Other hyperlipidemia: Secondary | ICD-10-CM

## 2020-10-02 DIAGNOSIS — Z85828 Personal history of other malignant neoplasm of skin: Secondary | ICD-10-CM | POA: Diagnosis not present

## 2020-10-02 DIAGNOSIS — Z0000 Encounter for general adult medical examination without abnormal findings: Secondary | ICD-10-CM

## 2020-10-02 DIAGNOSIS — I10 Essential (primary) hypertension: Secondary | ICD-10-CM

## 2020-10-02 DIAGNOSIS — I872 Venous insufficiency (chronic) (peripheral): Secondary | ICD-10-CM | POA: Diagnosis not present

## 2020-10-02 DIAGNOSIS — E039 Hypothyroidism, unspecified: Secondary | ICD-10-CM

## 2020-10-02 DIAGNOSIS — L97822 Non-pressure chronic ulcer of other part of left lower leg with fat layer exposed: Secondary | ICD-10-CM | POA: Diagnosis not present

## 2020-10-02 DIAGNOSIS — L03116 Cellulitis of left lower limb: Secondary | ICD-10-CM | POA: Diagnosis not present

## 2020-10-02 NOTE — Progress Notes (Addendum)
[name], [name] ([id number]) Visit Report for 10/02/2020 Chief Complaint Document Details Patient Name: [name], [name] Date of Service: 10/02/2020 2:45 PM Medical Record Number: [id number] Patient Account Number: [id number] Date of Birth/Sex: August 26, 1950 (70 y.o. F) Treating RN: Dolan Amen Primary Care Provider: Miguel Aschoff Other Clinician: Jeanine Luz Referring Provider: Miguel Aschoff Treating Provider/Extender: Skipper Cliche in Treatment: 2 Information Obtained from: Patient Chief Complaint Left LE Surgical Ulcer Electronic Signature(s) Signed: 10/02/2020 3:38:24 PM By: Worthy Keeler PA-C Entered By: Worthy Keeler on 38/03/1750 02:58:52 [name], [name] ([id number]) -------------------------------------------------------------------------------- HPI Details Patient Name: [name] Date of Service: 10/02/2020 2:45 PM Medical Record Number: [id number] Patient Account Number: [id number] Date of Birth/Sex: [birth year] (70 y.o. F) Treating RN: Dolan Amen Primary Care Provider: Miguel Aschoff Other Clinician: Jeanine Luz Referring Provider: Miguel Aschoff Treating Provider/Extender: Skipper Cliche in Treatment: 2 History of Present Illness HPI Description: 09/18/2020 upon evaluation today patient presents for initial inspection here in clinic concerning issues that she has been having unfortunately with a wound on [name] which has been present actually since December 7 when she had a squamous cell carcinoma removed. Subsequently I did review the pictures that she had on [name] of the carcinoma site post excision and it was quite significant. With that being said the tension that was needed to pull the edges of the wound together to approximate apparently caused some issues as far as appropriate healing was concerned and this became necrotic and unfortunately dehisced. Subsequently the patient has since  been undergoing treatment at the [facility] where to be honest they have been attempting as much as possible to manage both infections as well as the overall swelling of the leg. They have been placing the patient in Unna boots for the past 3 weeks. Subsequently she also did have a culture which was on 08/27/2020 that showed Enterococcus and then she was on doxycycline at that point. Subsequently she was switched to amoxicillin based on the culture on 09/02/2020 she is done with that at this point. With that being said the patient is very frustrated with how long this has been going on she notes that today during our conversation. This was a squamous cell carcinoma that led to this need for surgery. 09/25/2020 upon evaluation today patient appears to be doing well with regard to [name]. She is tolerating the dressing changes without complication fortunately there does not appear to be any signs of infection which is great news. No fevers, chills, nausea, vomiting, or diarrhea. She is going to require little bit of debridement today in regard to an area that is somewhat appearing to be a hematoma in the upper portion of the wound this is very tiny but nonetheless does seem to have some fluid collecting in this area. 10/02/2020 upon evaluation today patient appears to be doing well currently with regard to [name]. This is showing signs of excellent improvement which is great news and overall very pleased with where things stand today. Electronic Signature(s) Signed: 10/02/2020 5:31:27 PM By: Worthy Keeler PA-C Entered By: Worthy Keeler on 08/67/6195 09:32:67 [name], [name] ([id number]) -------------------------------------------------------------------------------- Physical Exam Details Patient Name: [name] Date of Service: 10/02/2020 2:45 PM Medical Record Number: [id number] Patient Account Number: [id number] Date of Birth/Sex: Dec 20, 1950 (70 y.o.  F) Treating RN: Dolan Amen Primary Care Provider: Miguel Aschoff Other Clinician: Jeanine Luz Referring Provider: Miguel Aschoff Treating Provider/Extender: Jeri Cos Weeks in Treatment: 2 Constitutional Well-nourished and  well-hydrated in no acute distress. Respiratory normal breathing without difficulty. Psychiatric this patient is able to make decisions and demonstrates good insight into disease process. Alert and Oriented x 3. pleasant and cooperative. Notes Patient's wound bed showed signs of good granulation epithelization at this point no sharp debridement was necessary and overall I am very pleased with where things stand. No fevers, chills, nausea, vomiting, or diarrhea. Electronic Signature(s) Signed: 10/02/2020 5:31:40 PM By: Worthy Keeler PA-C Entered By: Worthy Keeler on 33/29/5188 41:66:06 [name], [name] ([id number]) -------------------------------------------------------------------------------- Physician Orders Details Patient Name: [name] Date of Service: 10/02/2020 2:45 PM Medical Record Number: [id number] Patient Account Number: [id number] Date of Birth/Sex: 06-Dec-1950 (70 y.o. 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 Bathing/ Shower/ Hygiene o May shower with wound dressing protected with water repellent cover or cast  protector. Edema Control - Lymphedema / Segmental Compressive Device / Other Left Lower Extremity o Optional: One layer of unna paste to top of compression wrap (to act as an anchor). o 3 Layer Compression System for Lymphedema. o Elevate legs to the level of the heart and pump ankles as often as possible o Elevate leg(s) parallel to the floor when sitting. Wound Treatment Wound #1 - Lower Leg Wound Laterality: Left, Medial Cleanser: Soap and Water 1 x Per Week/15 Days Discharge Instructions: Gently cleanse wound with antibacterial soap, rinse and pat dry prior to dressing wounds Peri-Wound Care: Desitin Maximum Strength Ointment 4 (oz) 1 x Per Week/15 Days Discharge Instructions: Apply around wound Primary Dressing: Hydrofera Blue Ready Transfer Foam, 2.5x2.5 (in/in) 1 x Per Week/15 Days Discharge Instructions: Tuck a small piece in depth at 12 o'clock, apply Hydrofera Blue Ready to wound bed on top Secondary Dressing: ABD Pad 5x9 (in/in) 1 x Per Week/15 Days Discharge Instructions: Cover with ABD pad Compression Wrap: Profore Lite LF 3 Multilayer Compression Bandaging System 1 x Per Week/15 Days Discharge Instructions: Apply 3 multi-layer wrap as prescribed. Electronic Signature(s) Signed: 10/02/2020 5:28:26 PM By: Georges Mouse, Minus Breeding RN Signed: 10/02/2020 5:45:27 PM By: Worthy Keeler PA-C Entered By: Georges Mouse, Minus Breeding on 25/42/7062 37:62:83 [name], [name] ([id number]) -------------------------------------------------------------------------------- Problem List Details Patient Name: [name] Date of Service: 10/02/2020 2:45 PM Medical Record Number: [id number] Patient Account Number: [id number] Date of Birth/Sex: 1950/11/10 (70 y.o. 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 F) Treating RN: Dolan Amen Primary Care Provider: Miguel Aschoff Other Clinician: Jeanine Luz Referring Provider: Miguel Aschoff Treating Provider/Extender: Skipper Cliche in Treatment: 2 Subjective Chief Complaint Information obtained from Patient Left LE Surgical Ulcer History of Present Illness (HPI) 09/18/2020 upon evaluation today patient presents for initial inspection here in clinic concerning issues that she has been having unfortunately with a wound on [name] which has been present actually since December 7 when she had a squamous cell carcinoma removed. Subsequently I did review the pictures that she had on [name] of the carcinoma site post excision and it was quite significant. With that being said the tension that was needed to pull the edges of the wound together to approximate apparently caused some issues as far as appropriate healing was concerned and this became necrotic and unfortunately dehisced. Subsequently the  patient has since been undergoing treatment at the [facility] where to be honest they have been attempting as much as possible to manage both infections as well as the overall swelling of the leg. They have been placing the patient in Unna boots for the past 3 weeks. Subsequently she also did have a culture which was on 08/27/2020 that showed Enterococcus and then she was on doxycycline at that point. Subsequently she was switched to amoxicillin based on the culture on 09/02/2020 she is done with that at this point. With that being said the patient is very frustrated with how long this has been going on she notes that today during our conversation. This was a squamous cell carcinoma that led to this need for surgery. 09/25/2020 upon evaluation today patient appears to be doing well with regard to [name]. She is tolerating the dressing changes without complication fortunately there does not appear to be any signs of infection which is great news. No fevers, chills, nausea, vomiting, or diarrhea. She is going to require little bit of debridement today in regard to an area that is somewhat appearing to be a hematoma in the upper portion of the wound this is very tiny but nonetheless does seem to have some fluid collecting in this area. 10/02/2020 upon evaluation today patient appears to be doing well currently with regard to [name]. This is showing signs of excellent improvement which is great news and overall very pleased with where things stand today. Objective Constitutional Well-nourished and well-hydrated in no acute distress. Vitals Time Taken: 3:21 PM, Temperature: 98.2 F, Pulse: 93 bpm, Respiratory Rate: 18 breaths/min, Blood Pressure: 128/80 mmHg. Respiratory normal breathing without difficulty. Psychiatric this patient is able to make decisions and demonstrates good insight into disease process. Alert and Oriented x 3. pleasant and cooperative. General Notes:  Patient's wound bed showed signs of good granulation epithelization at this point no sharp debridement was necessary and overall I am very pleased with where things stand. No fevers, chills, nausea, vomiting, or diarrhea. Integumentary (Hair, Skin) Wound #1 status is Open. Original cause of wound was Surgical Injury. The date acquired was: 05/27/2020. The wound has been in treatment 2 weeks. The wound is located on the Left,Medial Lower Leg. The wound measures 1.6cm length x 0.9cm width x 0.1cm depth; 1.131cm^2 area and 0.113cm^3 volume. There is Fat Layer (Subcutaneous Tissue) exposed. There is no tunneling or undermining noted. There is a medium amount of serosanguineous  drainage noted. There is large (67-100%) red, hyper - granulation within the wound bed. There is a small (1-33%) amount of necrotic tissue within the wound bed including Adherent Slough. 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 There was a Three Layer Compression Therapy Procedure with a pre-treatment ABI of 1.3 by Dolan Amen, RN. Post procedure Diagnosis Wound #1: Same as Pre-Procedure Plan Follow-up Appointments: Return Appointment in 1 week. Bathing/ Shower/ Hygiene: May shower with wound dressing protected with water repellent cover or cast protector. Edema Control - Lymphedema / Segmental Compressive Device / Other: Optional: One layer of unna paste to top of compression wrap (to act as an anchor). 3 Layer Compression System for Lymphedema. Elevate legs to the level of the heart and pump ankles as often  as possible Elevate leg(s) parallel to the floor when sitting. WOUND #1: - Lower Leg Wound Laterality: Left, Medial Cleanser: Soap and Water 1 x Per Week/15 Days Discharge Instructions: Gently cleanse wound with antibacterial soap, rinse and pat dry prior to dressing wounds Peri-Wound Care: Desitin Maximum Strength Ointment 4 (oz) 1 x Per Week/15 Days Discharge Instructions: Apply around wound Primary Dressing: Hydrofera Blue Ready Transfer Foam, 2.5x2.5 (in/in) 1 x Per Week/15 Days Discharge Instructions: Tuck a small piece in depth at 12 o'clock, apply Hydrofera Blue Ready to wound bed on top Secondary Dressing: ABD Pad 5x9 (in/in) 1 x Per Week/15 Days Discharge Instructions: Cover with ABD pad Compression Wrap: Profore Lite LF 3 Multilayer Compression Bandaging System 1 x Per Week/15 Days Discharge Instructions: Apply 3 multi-layer wrap as prescribed. 1. Would recommend that we continue with the [facility] I think that still the best option and the patient is in agreement with the plan. 2. I am also can recommend that we initiate treatment with a 3 layer compression wrap to be continued which I think is doing a great job. 3. We will also use an ABD pad to cover. We will see patient back for reevaluation in 1 week here in the clinic. If anything worsens or changes patient will contact our office for additional recommendations. Electronic Signature(s) Signed: 10/02/2020 5:31:59 PM By: Worthy Keeler PA-C Entered By: Worthy Keeler on 46/96/2952 84:13:24 [name], [name] ([id number]) -------------------------------------------------------------------------------- SuperBill Details Patient Name: [name] Date of Service: 10/02/2020 Medical Record Number: [id number] Patient Account Number: [id number] Date of Birth/Sex: January 24, 1951 (70 y.o. 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, including hip Facility Procedures CPT4 Code: 25956387 Description: (Facility Use Only) 29581LT - APPLY MULTLAY COMPRS LWR LT LEG Modifier: Quantity: 1 Physician Procedures CPT4 Code: 5643329 Description: 99213 - WC PHYS LEVEL 3 - EST PT Modifier: Quantity: 1 CPT4 Code: Description: ICD-10 Diagnosis Description T81.31XA Disruption of external operation (surgical) wound, not elsewhere classifi L97.822 Non-pressure  chronic ulcer of other part of left lower leg with fat layer L03.116 Cellulitis of left lower limb C44.729  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

## 2020-10-02 NOTE — Progress Notes (Signed)
Error-no visit

## 2020-10-06 ENCOUNTER — Other Ambulatory Visit: Payer: Self-pay | Admitting: Family Medicine

## 2020-10-06 DIAGNOSIS — G629 Polyneuropathy, unspecified: Secondary | ICD-10-CM

## 2020-10-06 NOTE — Progress Notes (Signed)
LETANYA, FROH ([id number]) Visit Report for 10/02/2020 Arrival Information Details Patient Name: [name], [name] Date of Service: 10/02/2020 2:45 PM Medical Record Number: [id number] Patient Account Number: [id number] Date of Birth/Sex: 01-Feb-1951 (69 y.o. F) Treating RN: Dolan Amen Primary Care Errin Whitelaw: Miguel Aschoff Other Clinician: Jeanine Luz Referring Michaiah Maiden: Miguel Aschoff Treating Arthuro Canelo/Extender: Skipper Cliche in Treatment: 2 Visit Information History Since Last Visit All ordered tests and consults were completed: No Patient Arrived: Ambulatory Added or deleted any medications: No Arrival Time: 15:18 Any new allergies or adverse reactions: No Accompanied By: self Had a fall or experienced change in No Transfer Assistance: None activities of daily living that may affect Patient Identification Verified: Yes risk of falls: Secondary Verification Process Completed: Yes Signs or symptoms of abuse/neglect since last visito No Patient Requires Transmission-Based Precautions: No Hospitalized since last visit: No Patient Has Alerts: No Implantable device outside of the clinic excluding No cellular tissue based products placed in the center since last visit: Has Dressing in Place as Prescribed: Yes Has Compression in Place as Prescribed: Yes Pain Present Now: No Electronic Signature(s) Signed: 10/02/2020 5:08:57 PM By: Jeanine Luz Entered By: Jeanine Luz on 28/00/3491 79:15:05 [name], [name] ([id number]) -------------------------------------------------------------------------------- Clinic Level of Care Assessment Details Patient Name: [name] Date of Service: 10/02/2020 2:45 PM Medical Record Number: [id number] Patient Account Number: [id number] Date of Birth/Sex: [birth year] (69 y.o. F) Treating RN: Dolan Amen Primary Care Grae Cannata: Miguel Aschoff Other Clinician: Jeanine Luz Referring Toree Edling:  Miguel Aschoff Treating Daegon Deiss/Extender: Skipper Cliche in Treatment: 2 Clinic Level of Care Assessment Items TOOL 1 Quantity Score []  - Use when EandM and Procedure is performed on INITIAL visit 0 ASSESSMENTS - Nursing Assessment / Reassessment []  - General Physical Exam (combine w/ comprehensive assessment (listed just below) when performed on new 0 pt. evals) []  - 0 Comprehensive Assessment (HX, ROS, Risk Assessments, Wounds Hx, etc.) ASSESSMENTS - Wound and Skin Assessment / Reassessment []  - Dermatologic / Skin Assessment (not related to wound area) 0 ASSESSMENTS - Ostomy and/or Continence Assessment and Care []  - Incontinence Assessment and Management 0 []  - 0 Ostomy Care Assessment and Management (repouching, etc.) PROCESS - Coordination of Care []  - Simple Patient / Family Education for ongoing care 0 []  - 0 Complex (extensive) Patient / Family Education for ongoing care []  - 0 Staff obtains Programmer, systems, Records, Test Results / Process Orders []  - 0 Staff telephones HHA, Nursing Homes / Clarify orders / etc []  - 0 Routine Transfer to another Facility (non-emergent condition) []  - 0 Routine Hospital Admission (non-emergent condition) []  - 0 New Admissions / Biomedical engineer / Ordering NPWT, Apligraf, etc. []  - 0 Emergency Hospital Admission (emergent condition) PROCESS - Special Needs []  - Pediatric / Minor Patient Management 0 []  - 0 Isolation Patient Management []  - 0 Hearing / Language / Visual special needs []  - 0 Assessment of Community assistance (transportation, D/C planning, etc.) []  - 0 Additional assistance / Altered mentation []  - 0 Support Surface(s) Assessment (bed, cushion, seat, etc.) INTERVENTIONS - Miscellaneous []  - External ear exam 0 []  - 0 Patient Transfer (multiple staff / Civil Service fast streamer / Similar devices) []  - 0 Simple Staple / Suture removal (25 or less) []  - 0 Complex Staple / Suture removal (26 or more) []  -  0 Hypo/Hyperglycemic Management (do not check if billed separately) []  - 0 Ankle / Brachial Index (ABI) - do not check if billed separately Has the patient been seen at the hospital within the last three  years: Yes Total Score: 0 Level Of Care: ____ [name] ([id number]) Electronic Signature(s) Signed: 10/02/2020 5:28:26 PM By: Georges Mouse, Minus Breeding RN Entered By: Georges Mouse, Minus Breeding on 13/24/4010 27:25:36 [name], [name] ([id number]) -------------------------------------------------------------------------------- Compression Therapy Details Patient Name: [name] Date of Service: 10/02/2020 2:45 PM Medical Record Number: [id number] Patient Account Number: [id number] Date of Birth/Sex: 01-21-1951 (69 y.o. F) Treating RN: Dolan Amen Primary Care Billiejean Schimek: Miguel Aschoff Other Clinician: Jeanine Luz Referring Heer Justiss: Miguel Aschoff Treating Kasidy Gianino/Extender: Jeri Cos Weeks in Treatment: 2 Compression Therapy Performed for Wound Assessment: Wound #1 Left,Medial Lower Leg Performed By: Clinician Dolan Amen, RN Compression Type: Three Layer Pre Treatment ABI: 1.3 Post Procedure Diagnosis Same as Pre-procedure Electronic Signature(s) Signed: 10/02/2020 5:28:26 PM By: Georges Mouse, Minus Breeding RN Entered By: Georges Mouse, Minus Breeding on 43/32/9518 84:16:60 [name], [name] ([id number]) -------------------------------------------------------------------------------- Encounter Discharge Information Details Patient Name: [name] Date of Service: 10/02/2020 2:45 PM Medical Record Number: [id number] Patient Account Number: [id number] Date of Birth/Sex: 06-04-1951 (69 y.o. F) Treating RN: Carlene Coria Primary Care Orlene Salmons: Miguel Aschoff Other Clinician: Jeanine Luz Referring Kodee Ravert: Miguel Aschoff Treating Lillyann Ahart/Extender: Skipper Cliche in Treatment: 2 Encounter Discharge Information Items Discharge  Condition: Stable Ambulatory Status: Ambulatory Discharge Destination: Home Transportation: Private Auto Accompanied By: self Schedule Follow-up Appointment: Yes Clinical Summary of Care: Patient Declined Electronic Signature(s) Signed: 10/06/2020 7:55:11 AM By: Carlene Coria RN Entered By: Carlene Coria on 02/54/2706 23:76:28 [name], [name] ([id number]) -------------------------------------------------------------------------------- Lower Extremity Assessment Details Patient Name: [name] Date of Service: 10/02/2020 2:45 PM Medical Record Number: [id number] Patient Account Number: [id number] Date of Birth/Sex: 17-Mar-1951 (69 y.o. F) Treating RN: Dolan Amen Primary Care Micheil Klaus: Miguel Aschoff Other Clinician: Jeanine Luz Referring Jurnei Latini: Miguel Aschoff Treating Keatyn Jawad/Extender: Jeri Cos Weeks in Treatment: 2 Edema Assessment Assessed: [Left: No] [Right: No] Edema: [Left: Ye] [Right: s] Calf Left: Right: Point of Measurement: 39 cm From Medial Instep 32 cm Ankle Left: Right: Point of Measurement: 10 cm From Medial Instep 23 cm Vascular Assessment Pulses: Dorsalis Pedis Palpable: [Left:Yes] Electronic Signature(s) Signed: 10/02/2020 3:45:06 PM By: Carlene Coria RN Signed: 10/02/2020 5:28:26 PM By: Georges Mouse, Minus Breeding RN Entered By: Carlene Coria on 48/54/6270 35:00:93 [name], [name] ([id number]) -------------------------------------------------------------------------------- Multi Wound Chart Details Patient Name: [name] Date of Service: 10/02/2020 2:45 PM Medical Record Number: [id number] Patient Account Number: [id number] Date of Birth/Sex: [birth year] (69 y.o. F) Treating RN: Dolan Amen Primary Care Jeanell Mangan: Miguel Aschoff Other Clinician: Jeanine Luz Referring Keyanah Kozicki: Miguel Aschoff Treating Ebrima Ranta/Extender: Skipper Cliche in Treatment: 2 Vital Signs Height(in): Pulse(bpm):  64 Weight(lbs): Blood Pressure(mmHg): 128/80 Body Mass Index(BMI): Temperature(F): 98.2 Respiratory Rate(breaths/min): 18 Photos: [N/A:N/A] Wound Location: Left, Medial Lower Leg N/A N/A Wounding Event: Surgical Injury N/A N/A Primary Etiology: Dehisced Wound N/A N/A Comorbid History: Cataracts, Gout N/A N/A Date Acquired: 05/27/2020 N/A N/A Weeks of Treatment: 2 N/A N/A Wound Status: Open N/A N/A Measurements L x W x D (cm) 1.6x0.9x0.1 N/A N/A Area (cm) : 1.131 N/A N/A Volume (cm) : 0.113 N/A N/A % Reduction in Area: 83.50% N/A N/A % Reduction in Volume: 91.80% N/A N/A Classification: Full Thickness Without Exposed N/A N/A Support Structures Exudate Amount: Medium N/A N/A Exudate Type: Serosanguineous N/A N/A Exudate Color: red, brown N/A N/A Granulation Amount: Large (67-100%) N/A N/A Granulation Quality: Red, Hyper-granulation N/A N/A Necrotic Amount: Small (1-33%) N/A N/A Exposed Structures: Fat Layer (Subcutaneous Tissue): N/A N/A Yes Fascia: No Tendon: No Muscle: No Joint: No Bone: No Epithelialization: Small (1-33%) N/A N/A Treatment Notes Electronic Signature(s) Signed:  10/02/2020 5:28:26 PM By: Georges Mouse, Minus Breeding RN Entered By: Georges Mouse, Minus Breeding on 42/59/5638 75:64:33 [name], [name] ([id number]) -------------------------------------------------------------------------------- Multi-Disciplinary Care Plan Details Patient Name: [name] Date of Service: 10/02/2020 2:45 PM Medical Record Number: [id number] Patient Account Number: [id number] Date of Birth/Sex: 13-Dec-1950 (69 y.o. F) Treating RN: Dolan Amen Primary Care Jayen Bromwell: Miguel Aschoff Other Clinician: Jeanine Luz Referring Manjot Beumer: Miguel Aschoff Treating Kadance Mccuistion/Extender: Skipper Cliche in Treatment: 2 Active Inactive Wound/Skin Impairment Nursing Diagnoses: Impaired tissue integrity Goals: Patient/caregiver will verbalize understanding of skin care  regimen Date Initiated: 09/18/2020 Date Inactivated: 09/25/2020 Target Resolution Date: 09/18/2020 Goal Status: Met Ulcer/skin breakdown will have a volume reduction of 30% by week 4 Date Initiated: 09/18/2020 Target Resolution Date: 10/18/2020 Goal Status: Active Ulcer/skin breakdown will have a volume reduction of 50% by week 8 Date Initiated: 09/18/2020 Target Resolution Date: 11/18/2020 Goal Status: Active Ulcer/skin breakdown will have a volume reduction of 80% by week 12 Date Initiated: 09/18/2020 Target Resolution Date: 12/18/2020 Goal Status: Active Ulcer/skin breakdown will heal within 14 weeks Date Initiated: 09/18/2020 Target Resolution Date: 01/18/2021 Goal Status: Active Interventions: Assess patient/caregiver ability to obtain necessary supplies Assess patient/caregiver ability to perform ulcer/skin care regimen upon admission and as needed Assess ulceration(s) every visit Provide education on ulcer and skin care Treatment Activities: Referred to DME Memory [facility] for dressing supplies : 09/18/2020 Skin care regimen initiated : 09/18/2020 Notes: Electronic Signature(s) Signed: 10/02/2020 5:28:26 PM By: Georges Mouse, Minus Breeding RN Entered By: Georges Mouse, Minus Breeding on 09/32/3557 32:20:25 Munday ([id number]) -------------------------------------------------------------------------------- Pain Assessment Details Patient Name: [name] Date of Service: 10/02/2020 2:45 PM Medical Record Number: [id number] Patient Account Number: [id number] Date of Birth/Sex: 10/26/1950 (69 y.o. F) Treating RN: Dolan Amen Primary Care Jeric Slagel: Miguel Aschoff Other Clinician: Jeanine Luz Referring Alyssia Heese: Miguel Aschoff Treating Phillipe Clemon/Extender: Skipper Cliche in Treatment: 2 Active Problems Location of Pain Severity and Description of Pain Patient Has Paino No Site Locations Pain Management and Medication Current Pain Management: Electronic  Signature(s) Signed: 10/02/2020 3:45:06 PM By: Carlene Coria RN Signed: 10/02/2020 5:28:26 PM By: Georges Mouse, Minus Breeding RN Entered By: Carlene Coria on 60/73/7106 26:94:85 [name], [name] ([id number]) -------------------------------------------------------------------------------- Patient/Caregiver Education Details Patient Name: [name] Date of Service: 10/02/2020 2:45 PM Medical Record Number: [id number] Patient Account Number: [id number] Date of Birth/Gender: 1950-08-28 (69 y.o. F) Treating RN: Dolan Amen Primary Care Physician: Miguel Aschoff Other Clinician: Jeanine Luz Referring Physician: Miguel Aschoff Treating Physician/Extender: Skipper Cliche in Treatment: 2 Education Assessment Education Provided To: Patient Education Topics Provided Wound/Skin Impairment: Methods: Explain/Verbal Responses: State content correctly Electronic Signature(s) Signed: 10/02/2020 5:28:26 PM By: Georges Mouse, Minus Breeding RN Entered By: Georges Mouse, Minus Breeding on 71/69/6789 38:10:17 [name], [name] ([id number]) -------------------------------------------------------------------------------- Wound Assessment Details Patient Name: [name] Date of Service: 10/02/2020 2:45 PM Medical Record Number: [id number] Patient Account Number: [id number] Date of Birth/Sex: 17-Jun-1951 (69 y.o. F) Treating RN: Dolan Amen Primary Care Jakeria Caissie: Miguel Aschoff Other Clinician: Jeanine Luz Referring Kameren Pargas: Miguel Aschoff Treating Davinder Haff/Extender: Jeri Cos Weeks in Treatment: 2 Wound Status Wound Number: 1 Primary Etiology: Dehisced Wound Wound Location: Left, Medial Lower Leg Wound Status: Open Wounding Event: Surgical Injury Comorbid History: Cataracts, Gout Date Acquired: 05/27/2020 Weeks Of Treatment: 2 Clustered Wound: No Photos Wound Measurements Length: (cm) 1.6 Width: (cm) 0.9 Depth: (cm) 0.1 Area: (cm) 1.131 Volume: (cm)  0.113 % Reduction in Area: 83.5% % Reduction in Volume: 91.8% Epithelialization: Small (1-33%) Tunneling: No Undermining: No Wound Description Classification: Full Thickness Without Exposed Support Structu Exudate Amount: Medium Exudate  Type: Serosanguineous Exudate Color: red, brown res Foul Odor After Cleansing: No Slough/Fibrino Yes Wound Bed Granulation Amount: Large (67-100%) Exposed Structure Granulation Quality: Red, Hyper-granulation Fascia Exposed: No Necrotic Amount: Small (1-33%) Fat Layer (Subcutaneous Tissue) Exposed: Yes Necrotic Quality: Adherent Slough Tendon Exposed: No Muscle Exposed: No Joint Exposed: No Bone Exposed: No Treatment Notes Wound #1 (Lower Leg) Wound Laterality: Left, Medial Cleanser Soap and Water Discharge Instruction: Gently cleanse wound with antibacterial soap, rinse and pat dry prior to dressing wounds Peri-Wound Care Desitin Maximum Strength Ointment 4 (oz) [name], Murlean ([id number]) Discharge Instruction: Apply around wound Topical Primary Dressing Hydrofera Blue Ready Transfer Foam, 2.5x2.5 (in/in) Discharge Instruction: Tuck a small piece in depth at 12 o'clock, apply Hydrofera Blue Ready to wound bed on top Secondary Dressing ABD Pad 5x9 (in/in) Discharge Instruction: Cover with ABD pad Secured With Compression Wrap Profore Lite LF 3 Multilayer Compression Bandaging System Discharge Instruction: Apply 3 multi-layer wrap as prescribed. Compression Stockings Add-Ons Electronic Signature(s) Signed: 10/02/2020 5:08:57 PM By: Jeanine Luz Signed: 10/02/2020 5:28:26 PM By: Georges Mouse, Minus Breeding RN Entered By: Jeanine Luz on 88/45/7334 48:30:15 [name], [name] ([id number]) -------------------------------------------------------------------------------- Vitals Details Patient Name: [name] Date of Service: 10/02/2020 2:45 PM Medical Record Number: [id number] Patient Account Number:  [id number] Date of Birth/Sex: 1951/05/01 (69 y.o. F) Treating RN: Dolan Amen Primary Care Tamber Burtch: Miguel Aschoff Other Clinician: Jeanine Luz Referring Elma Limas: Miguel Aschoff Treating Havier Deeb/Extender: Jeri Cos Weeks in Treatment: 2 Vital Signs Time Taken: 15:21 Temperature (F): 98.2 Pulse (bpm): 93 Respiratory Rate (breaths/min): 18 Blood Pressure (mmHg): 128/80 Reference Range: 80 - 120 mg / dl Electronic Signature(s) Signed: 10/02/2020 3:45:06 PM By: Carlene Coria RN Entered By: Carlene Coria on 10/02/2020 15:36:50

## 2020-10-07 ENCOUNTER — Other Ambulatory Visit: Payer: Self-pay | Admitting: Family Medicine

## 2020-10-09 ENCOUNTER — Ambulatory Visit (INDEPENDENT_AMBULATORY_CARE_PROVIDER_SITE_OTHER): Payer: Medicare Other | Admitting: Family Medicine

## 2020-10-09 ENCOUNTER — Encounter: Payer: Self-pay | Admitting: Family Medicine

## 2020-10-09 ENCOUNTER — Other Ambulatory Visit: Payer: Self-pay

## 2020-10-09 ENCOUNTER — Encounter: Payer: Medicare Other | Admitting: Physician Assistant

## 2020-10-09 VITALS — BP 111/73 | HR 81 | Temp 97.8°F | Resp 16 | Ht 65.0 in | Wt 179.0 lb

## 2020-10-09 DIAGNOSIS — I872 Venous insufficiency (chronic) (peripheral): Secondary | ICD-10-CM | POA: Diagnosis not present

## 2020-10-09 DIAGNOSIS — L309 Dermatitis, unspecified: Secondary | ICD-10-CM | POA: Diagnosis not present

## 2020-10-09 DIAGNOSIS — I10 Essential (primary) hypertension: Secondary | ICD-10-CM | POA: Diagnosis not present

## 2020-10-09 DIAGNOSIS — L03116 Cellulitis of left lower limb: Secondary | ICD-10-CM | POA: Diagnosis not present

## 2020-10-09 DIAGNOSIS — E039 Hypothyroidism, unspecified: Secondary | ICD-10-CM

## 2020-10-09 DIAGNOSIS — Z85828 Personal history of other malignant neoplasm of skin: Secondary | ICD-10-CM | POA: Diagnosis not present

## 2020-10-09 DIAGNOSIS — E7849 Other hyperlipidemia: Secondary | ICD-10-CM | POA: Diagnosis not present

## 2020-10-09 DIAGNOSIS — L97822 Non-pressure chronic ulcer of other part of left lower leg with fat layer exposed: Secondary | ICD-10-CM | POA: Diagnosis not present

## 2020-10-09 DIAGNOSIS — Z0000 Encounter for general adult medical examination without abnormal findings: Secondary | ICD-10-CM | POA: Diagnosis not present

## 2020-10-09 DIAGNOSIS — T8131XA Disruption of external operation (surgical) wound, not elsewhere classified, initial encounter: Secondary | ICD-10-CM | POA: Diagnosis not present

## 2020-10-09 MED ORDER — TRIAMCINOLONE ACETONIDE 0.1 % EX CREA: 1.0000 "application " | TOPICAL_CREAM | Freq: Two times a day (BID) | CUTANEOUS | 0 refills | Status: DC

## 2020-10-09 NOTE — Progress Notes (Signed)
I,[name],acting as a scribe for [name] Durie, MD.,have documented all relevant documentation on the behalf of [name] Durie, MD,as directed by  [name] Durie, MD while in the presence of [name] Durie, MD.   Annual Wellness Visit     Patient: [name], Female    DOB: [birth year], 70 y.o.   MRN: [id number] Visit Date: 10/09/2020  Today's Provider: [name], MD   Chief Complaint  Patient presents with  . Medicare Wellness   Subjective    [name] is a 70 y.o. female who presents today for her Annual Wellness Visit. She reports consuming a general diet. She generally feels well. She reports sleeping well. She does not have additional problems to discuss today.   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  He had bacterial endocarditis subsequent strokes also late 2021.  He is improving but still requires her constant attention. 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  She is going to the wound clinic now and is improving. She does have some eczema on different areas of the hands elbows arms.      Medications: Outpatient Medications Prior to Visit  Medication Sig  . allopurinol (ZYLOPRIM) 300 MG tablet TAKE 1 TABLET(300 MG) BY MOUTH DAILY  . Ascorbic Acid (VITAMIN C) 500 MG CHEW Chew 2 tablets by mouth daily.  . Biotin 5 MG TABS Take 1 tablet by mouth daily.  . Cholecalciferol (VITAMIN D-1000 MAX ST) 25 MCG (1000 UT) tablet Take 1 tablet by mouth daily.  Marland Kitchen gabapentin (NEURONTIN) 100 MG capsule TAKE 1 CAPSULE(100 MG) BY MOUTH AT BEDTIME  . gentamicin ointment (GARAMYCIN) 0.1 % Apply 1 application topically 2 (two) times daily.  Marland Kitchen levothyroxine (SYNTHROID) 75 MCG tablet TAKE 1 TABLET(75 MCG) BY MOUTH DAILY  . Multiple Vitamins-Minerals (ZINC PO) Take by  mouth.  . mupirocin ointment (BACTROBAN) 2 % Apply 1 application topically daily.  . Naproxen Sodium 220 MG CAPS Take 1 tablet by mouth at bedtime as needed.  Marland Kitchen omeprazole (PRILOSEC) 20 MG capsule Take 1 capsule by mouth daily.  . pseudoephedrine (SUDAFED) 30 MG tablet Take 15 mg by mouth in the morning and at bedtime.  . simvastatin (ZOCOR) 40 MG tablet TAKE 1 TABLET(40 MG) BY MOUTH DAILY  . triamterene-hydrochlorothiazide (DYAZIDE) 37.5-25 MG capsule TAKE ONE CAPSULE BY MOUTH EVERY DAY  . Zinc 30 MG TABS Take 1 tablet by mouth daily.  Marland Kitchen zolpidem (AMBIEN) 5 MG tablet TAKE 1 TABLET(5 MG) BY MOUTH AT BEDTIME AS NEEDED  . aspirin EC 81 MG tablet Take 1 tablet by mouth daily. (Patient not taking: No sig reported)   No facility-administered medications prior to visit.    No Known Allergies  Patient Care Team: Jerrol Banana., MD as PCP - General (Family Medicine)  Review of Systems  All other systems reviewed and are negative.        Objective    Vitals: BP 111/73 (BP Location: Left Arm, Patient Position: Sitting, Cuff Size: Large)   Pulse 81   Temp 97.8 F (36.6 C) (Oral)   Resp 16   Ht 5\' 5"  (1.651 m)   Wt 179 lb (81.2 kg)   LMP  (LMP Unknown) Comment: 1980s  SpO2 97%   BMI 29.79 kg/m  BP Readings from  Last 3 Encounters:  10/09/20 111/73  10/02/20 111/76  03/10/20 102/65   Wt Readings from Last 3 Encounters:  10/09/20 179 lb (81.2 kg)  10/02/20 179 lb (81.2 kg)  03/10/20 175 lb (79.4 kg)      Physical Exam Vitals reviewed.  HENT:     Head: Normocephalic and atraumatic.     Right Ear: Tympanic membrane and external ear normal.     Left Ear: Tympanic membrane and external ear normal.     Nose: Nose normal.     Mouth/Throat:     Pharynx: Oropharynx is clear.  Eyes:     General: No scleral icterus.    Conjunctiva/sclera: Conjunctivae normal.     Pupils: Pupils are equal, round, and reactive to light.  Cardiovascular:     Rate and Rhythm: Normal rate and  regular rhythm.     Pulses: Normal pulses.     Heart sounds: Normal heart sounds.  Pulmonary:     Effort: Pulmonary effort is normal.     Breath sounds: Normal breath sounds.  Abdominal:     Palpations: Abdomen is soft.  Musculoskeletal:     Comments: Swelling of PIPs and DIPs of both hands.  Lymphadenopathy:     Cervical: No cervical adenopathy.  Skin:    General: Skin is warm and dry.     Comments: Eczematous changes of hands and elbows.  Neurological:     General: No focal deficit present.     Mental Status: She is alert and oriented to person, place, and time.  Psychiatric:        Mood and Affect: Mood normal.        Behavior: Behavior normal.        Thought Content: Thought content normal.        Judgment: Judgment normal.      Most recent functional status assessment: In your present state of health, do you have any difficulty performing the following activities: 10/02/2020  Hearing? N  Vision? N  Difficulty concentrating or making decisions? Y  Walking or climbing stairs? Y  Dressing or bathing? N  Doing errands, shopping? N  Some recent data might be hidden   Most recent fall risk assessment: Fall Risk  10/02/2020  Falls in the past year? 0  Number falls in past yr: 0  Injury with Fall? 0  Follow up Falls evaluation completed    Most recent depression screenings: PHQ 2/9 Scores 10/02/2020 03/10/2020  PHQ - 2 Score 1 0  PHQ- 9 Score 5 2   Most recent cognitive screening: No flowsheet data found. Most recent Audit-C alcohol use screening Alcohol Use Disorder Test (AUDIT) 10/02/2020  1. How often do you have a drink containing alcohol? 1  2. How many drinks containing alcohol do you have on a typical day when you are drinking? 0  3. How often do you have six or more drinks on one occasion? 0  AUDIT-C Score 1  Alcohol Brief Interventions/Follow-up -   A score of 3 or more in women, and 4 or more in men indicates increased risk for alcohol abuse, EXCEPT if all  of the points are from question 1   No results found for any visits on 10/09/20.  Assessment & Plan     Annual wellness visit done today including the all of the following: Reviewed patient's Family Medical History Reviewed and updated list of patient's medical providers Assessment of cognitive impairment was done Assessed patient's functional ability Established a written schedule  for health screening [place] Completed and Reviewed  Exercise Activities and Dietary recommendations Goals   None     Immunization History  Administered Date(s) Administered  . Fluad Quad(high Dose 65+) 03/10/2020  . Influenza, High Dose Seasonal PF 07/18/2017, 08/02/2018, 03/06/2019  . PFIZER(Purple Top)SARS-COV-2 Vaccination 07/15/2019, 08/06/2019  . Pneumococcal Conjugate-13 07/15/2016  . Pneumococcal Polysaccharide-23 07/18/2017  . Tdap 08/02/2018    Health Maintenance  Topic Date Due  . Hepatitis C Screening  Never done  . DEXA SCAN  Never done  . COVID-19 Vaccine (3 - Pfizer risk 4-dose series) 09/03/2019  . INFLUENZA VACCINE  01/19/2021  . MAMMOGRAM  09/10/2022  . TETANUS/TDAP  08/02/2028  . COLONOSCOPY (Pts 45-15yrs Insurance coverage will need to be confirmed)  06/11/2029  . PNA vac Low Risk Adult  Completed  . HPV VACCINES  Aged Out     Discussed health benefits of physical activity, and encouraged her to engage in regular exercise appropriate for her age and condition.   1. Encounter for annual wellness visit (AWV) in Medicare patient Patient is emotionally exhausted taking care of her husband.  Will obtain CCM consult for social work consult for possible respite care for her husband Cornelia Copa - CBC w/Diff/Platelet - Comprehensive Metabolic Panel (CMET) - Lipid panel - TSH  2. Annual physical exam Patient will need pelvic without Pap smear in 2023  3. Eczema, unspecified type May need dermatology referral but we can treat this present - triamcinolone  cream (KENALOG) 0.1 %; Apply 1 application topically 2 (two) times daily.  Dispense: 30 g; Refill: 0  4. Hypertension, unspecified type Good control - CBC w/Diff/Platelet - Comprehensive Metabolic Panel (CMET) - Lipid panel - TSH  5. Other hyperlipidemia  - CBC w/Diff/Platelet - Comprehensive Metabolic Panel (CMET) - Lipid panel - TSH  6. Hypothyroidism, unspecified type  - CBC w/Diff/Platelet - Comprehensive Metabolic Panel (CMET) - Lipid panel - TSH    No follow-ups on file.     I, [name] Durie, MD, have reviewed all documentation for this visit. The documentation on 10/15/20 for the exam, diagnosis, procedures, and orders are all accurate and complete.    Lorraine Cimmino Cranford Mon, MD  [facility] [phone] (phone) [phone] (fax)  [place]

## 2020-10-09 NOTE — Progress Notes (Addendum)
[name], [name] ([id number]) Visit Report for 10/09/2020 Chief Complaint Document Details Patient Name: [name], [name] Date of Service: 10/09/2020 9:15 AM Medical Record Number: [id number] Patient Account Number: [id number] Date of Birth/Sex: 08-22-1950 (69 y.o. F) Treating RN: [name] Primary Care Provider: Miguel [name] Other Clinician: Referring Provider: Miguel [name] Treating Provider/Extender: [name] in Treatment: 3 Information Obtained from: Patient Chief Complaint Left LE Surgical Ulcer Electronic Signature(s) Signed: 10/09/2020 9:50:31 AM By: Worthy Keeler PA-C Entered By: Worthy [name] on 53/61/4431 54:00:86 [name], [name] ([id number]) -------------------------------------------------------------------------------- Debridement Details Patient Name: [name] Date of Service: 10/09/2020 9:15 AM Medical Record Number: [id number] Patient Account Number: [id number] Date of Birth/Sex: 09/29/1950 (69 y.o. F) Treating RN: [name] Primary Care Provider: Miguel [name] Other Clinician: Referring Provider: Miguel [name] Treating Provider/Extender: [name] in Treatment: 3 Debridement Performed for Wound #1 Left,Medial Lower Leg Assessment: Performed By: Physician [name]., PA-C Debridement Type: Debridement Level of Consciousness (Pre- Awake and Alert procedure): Pre-procedure Verification/Time Out Yes - 09:57 Taken: Start Time: 09:57 Total Area Debrided (L x W): 1 (cm) x 1 (cm) = 1 (cm) Tissue and other material Viable, Non-Viable, Subcutaneous, Skin: Epidermis debrided: Level: Skin/Subcutaneous Tissue Debridement Description: Excisional Instrument: Curette Bleeding: Minimum Hemostasis Achieved: Pressure Response to Treatment: Procedure was tolerated well Level of Consciousness (Post- Awake and Alert procedure): Post Debridement Measurements of Total Wound Length: (cm)  1.5 Width: (cm) 0.6 Depth: (cm) 0.1 Volume: (cm) 0.071 Character of Wound/Ulcer Post Debridement: Stable Post Procedure Diagnosis Same as Pre-procedure Electronic Signature(s) Signed: 10/09/2020 5:15:58 PM By: [name] Nose RN Signed: 10/10/2020 1:09:49 PM By: Worthy Keeler PA-C Entered By: [name] on 98/33/8250 53:97:67 [name], [name] ([id number]) -------------------------------------------------------------------------------- HPI Details Patient Name: [name] Date of Service: 10/09/2020 9:15 AM Medical Record Number: [id number] Patient Account Number: [id number] Date of Birth/Sex: 1951/04/11 (69 y.o. F) Treating RN: [name] Primary Care Provider: Miguel [name] Other Clinician: Referring Provider: Miguel [name] Treating Provider/Extender: [name] in Treatment: 3 History of Present Illness HPI Description: 09/18/2020 upon evaluation today patient presents for initial inspection here in clinic concerning issues that she has been having unfortunately with a wound on [name] which has been present actually since December 7 when she had a squamous cell carcinoma removed. Subsequently I did review the pictures that she had on [name] of the carcinoma site post excision and it was quite significant. With that being said the tension that was needed to pull the edges of the wound together to approximate apparently caused some issues as far as appropriate healing was concerned and this became necrotic and unfortunately dehisced. Subsequently the patient has since been undergoing treatment at the [facility] where to be honest they have been attempting as much as possible to manage both infections as well as the overall swelling of the leg. They have been placing the patient in Unna boots for the past 3 weeks. Subsequently she also did have a culture which was on 08/27/2020 that showed Enterococcus and  then she was on doxycycline at that point. Subsequently she was switched to amoxicillin based on the culture on 09/02/2020 she is done with that at this point. With that being said the patient is very frustrated with how long this has been going on she notes that today during our conversation. This was a squamous cell carcinoma that led to this need for surgery. 09/25/2020 upon evaluation today patient appears to be doing well with regard  to [name]. She is tolerating the dressing changes without complication fortunately there does not appear to be any signs of infection which is great news. No fevers, chills, nausea, vomiting, or diarrhea. She is going to require little bit of debridement today in regard to an area that is somewhat appearing to be a hematoma in the upper portion of the wound this is very tiny but nonetheless does seem to have some fluid collecting in this area. 10/02/2020 upon evaluation today patient appears to be doing well currently with regard to [name]. This is showing signs of excellent improvement which is great news and overall very pleased with where things stand today. 10/09/2020 upon evaluation today patient appears to be doing well with regard to [name]. This is measuring smaller yet again. With that being said she does have a little bit of an opening where last week we found a small hematoma unfortunately that is still taking his time as far as healing is concerned. The Hydrofera Blue is done excellent I feel like we may want to switch over to collagen at this point with some of the deeper areas noted. Electronic Signature(s) Signed: 10/09/2020 10:16:59 AM By: Worthy Keeler PA-C Entered By: Worthy [name] on 51/88/4166 06:30:16 [name], [name] ([id number]) -------------------------------------------------------------------------------- Physical Exam Details Patient Name: [name] Date of Service: 10/09/2020 9:15 AM Medical Record  Number: [id number] Patient Account Number: [id number] Date of Birth/Sex: 1951/04/08 (69 y.o. F) Treating RN: [name] Primary Care Provider: Miguel [name] Other Clinician: Referring Provider: Miguel [name] Treating Provider/Extender: Jeri Cos Weeks in Treatment: 3 Constitutional Well-nourished and well-hydrated in no acute distress. Respiratory normal breathing without difficulty. Psychiatric this patient is able to make decisions and demonstrates good insight into disease process. Alert and Oriented x 3. pleasant and cooperative. Notes Upon inspection patient's wound bed actually showed signs of good granulation epithelization at this point. There does not appear to be any signs of infection I feel like she is doing quite well and I am very pleased in that regard. I do think that a switch from the [facility] to a silver collagen would be appropriate and she is in agreement with that plan. Electronic Signature(s) Signed: 10/09/2020 10:17:19 AM By: Worthy Keeler PA-C Entered By: Worthy [name] on 70/62/3762 83:15:17 [name], [name] ([id number]) -------------------------------------------------------------------------------- Physician Orders Details Patient Name: [name] Date of Service: 10/09/2020 9:15 AM Medical Record Number: [id number] Patient Account Number: [id number] Date of Birth/Sex: 1950-08-31 (69 y.o. F) Treating RN: [name] Primary Care Provider: Miguel [name] Other Clinician: Referring Provider: Miguel [name] Treating Provider/Extender: [name] in Treatment: 3 Verbal / Phone Orders: No Diagnosis Coding ICD-10 Coding Code Description T81.31XA Disruption of external operation (surgical) wound, not elsewhere classified, initial encounter L97.822 Non-pressure chronic ulcer of other part of left lower leg with fat layer exposed L03.116 Cellulitis of left lower limb C44.729 Squamous cell carcinoma of skin of left  lower limb, including hip Follow-up Appointments o Return Appointment in 1 week. Bathing/ Shower/ Hygiene o May shower with wound dressing protected with water repellent cover or cast protector. Edema Control - Lymphedema / Segmental Compressive Device / Other Left Lower Extremity o Optional: One layer of unna paste to top of compression wrap (to act as an anchor). o 3 Layer Compression System for Lymphedema. o Elevate legs to the level of the heart and pump ankles as often as possible o Elevate leg(s) parallel to the floor when sitting. Wound Treatment Wound #1 -  Lower Leg Wound Laterality: Left, Medial Cleanser: Soap and Water 1 x Per Week/15 Days Discharge Instructions: Gently cleanse wound with antibacterial soap, rinse and pat dry prior to dressing wounds Peri-Wound Care: Desitin Maximum Strength Ointment 4 (oz) 1 x Per Week/15 Days Discharge Instructions: Apply around wound Primary Dressing: Prisma 4.34 (in) 1 x Per Week/15 Days Discharge Instructions: Moisten w/normal saline or sterile water; Cover wound as directed. Do not remove from wound bed. Secondary Dressing: ABD Pad 5x9 (in/in) 1 x Per Week/15 Days Discharge Instructions: Cover with ABD pad Compression Wrap: Profore Lite LF 3 Multilayer Compression Bandaging System 1 x Per Week/15 Days Discharge Instructions: Apply 3 multi-layer wrap as prescribed. Electronic Signature(s) Signed: 10/09/2020 5:15:58 PM By: [name] Mouse, Minus Breeding RN Signed: 10/10/2020 1:09:49 PM By: Worthy Keeler PA-C Entered By: [name] on 96/29/5284 13:24:40 [name], [name] ([id number]) -------------------------------------------------------------------------------- Problem List Details Patient Name: [name] Date of Service: 10/09/2020 9:15 AM Medical Record Number: [id number] Patient Account Number: [id number] Date of Birth/Sex: 1950-10-02 (69 y.o. F) Treating RN: [name] Primary Care Provider:  Miguel [name] Other Clinician: Referring Provider: Miguel [name] Treating Provider/Extender: [name] in Treatment: 3 Active Problems ICD-10 Encounter Code Description Active Date MDM Diagnosis T81.31XA Disruption of external operation (surgical) wound, not elsewhere 09/18/2020 No Yes classified, initial encounter L97.822 Non-pressure chronic ulcer of other part of left lower leg with fat layer 09/18/2020 No Yes exposed L03.116 Cellulitis of left lower limb 09/18/2020 No Yes C44.729 Squamous cell carcinoma of skin of left lower limb, including hip 09/18/2020 No Yes Inactive Problems Resolved Problems Electronic Signature(s) Signed: 10/09/2020 9:50:26 AM By: Worthy Keeler PA-C Entered By: Worthy [name] on 95/63/8756 43:32:95 [name], [name] ([id number]) -------------------------------------------------------------------------------- Progress Note Details Patient Name: [name] Date of Service: 10/09/2020 9:15 AM Medical Record Number: [id number] Patient Account Number: [id number] Date of Birth/Sex: Jul 25, 1950 (69 y.o. F) Treating RN: [name] Primary Care Provider: Miguel [name] Other Clinician: Referring Provider: Miguel [name] Treating Provider/Extender: [name] in Treatment: 3 Subjective Chief Complaint Information obtained from Patient Left LE Surgical Ulcer History of Present Illness (HPI) 09/18/2020 upon evaluation today patient presents for initial inspection here in clinic concerning issues that she has been having unfortunately with a wound on [name] which has been present actually since December 7 when she had a squamous cell carcinoma removed. Subsequently I did review the pictures that she had on [name] of the carcinoma site post excision and it was quite significant. With that being said the tension that was needed to pull the edges of the wound together to approximate apparently caused some  issues as far as appropriate healing was concerned and this became necrotic and unfortunately dehisced. Subsequently the patient has since been undergoing treatment at the [place] [facility] where to be honest they have been attempting as much as possible to manage both infections as well as the overall swelling of the leg. They have been placing the patient in Unna boots for the past 3 weeks. Subsequently she also did have a culture which was on 08/27/2020 that showed Enterococcus and then she was on doxycycline at that point. Subsequently she was switched to amoxicillin based on the culture on 09/02/2020 she is done with that at this point. With that being said the patient is very frustrated with how long this has been going on she notes that today during our conversation. This was a squamous cell carcinoma that led to this need for surgery.  09/25/2020 upon evaluation today patient appears to be doing well with regard to [name]. She is tolerating the dressing changes without complication fortunately there does not appear to be any signs of infection which is great news. No fevers, chills, nausea, vomiting, or diarrhea. She is going to require little bit of debridement today in regard to an area that is somewhat appearing to be a hematoma in the upper portion of the wound this is very tiny but nonetheless does seem to have some fluid collecting in this area. 10/02/2020 upon evaluation today patient appears to be doing well currently with regard to [name]. This is showing signs of excellent improvement which is great news and overall very pleased with where things stand today. 10/09/2020 upon evaluation today patient appears to be doing well with regard to [name]. This is measuring smaller yet again. With that being said she does have a little bit of an opening where last week we found a small hematoma unfortunately that is still taking his time as far as healing is concerned. The  Hydrofera Blue is done excellent I feel like we may want to switch over to collagen at this point with some of the deeper areas noted. Objective Constitutional Well-nourished and well-hydrated in no acute distress. Vitals Time Taken: 9:30 AM, Temperature: 98.3 F, Pulse: 81 bpm, Respiratory Rate: 18 breaths/min, Blood Pressure: 115/78 mmHg. Respiratory normal breathing without difficulty. Psychiatric this patient is able to make decisions and demonstrates good insight into disease process. Alert and Oriented x 3. pleasant and cooperative. General Notes: Upon inspection patient's wound bed actually showed signs of good granulation epithelization at this point. There does not appear to be any signs of infection I feel like she is doing quite well and I am very pleased in that regard. I do think that a switch from the [facility] to a silver collagen would be appropriate and she is in agreement with that plan. Integumentary (Hair, Skin) Wound #1 status is Open. Original cause of wound was Surgical Injury. The date acquired was: 05/27/2020. The wound has been in treatment 3 weeks. The wound is located on the Left,Medial Lower Leg. The wound measures 1.5cm length x 0.6cm width x 0.1cm depth; 0.707cm^2 area and 0.071cm^3 volume. There is Fat Layer (Subcutaneous Tissue) exposed. There is no tunneling or undermining noted. There is a medium amount of serosanguineous drainage noted. There is large (67-100%) red, hyper - granulation within the wound bed. There is a small (1-33%) amount of necrotic tissue within the wound bed including Adherent Slough. 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 There was a Excisional Skin/Subcutaneous Tissue Debridement with a total area of 1 sq cm performed by [name]., PA-C. With the following instrument(s): Curette to remove Viable and Non-Viable tissue/material. Material removed includes Subcutaneous Tissue and Skin: Epidermis and. A time out was conducted at 09:57, prior to the start of the procedure. A Minimum amount of bleeding was controlled with Pressure. The procedure was tolerated well. Post Debridement Measurements: 1.5cm length x 0.6cm width x 0.1cm depth; 0.071cm^3 volume. Character of Wound/Ulcer Post Debridement is stable. Post procedure  Diagnosis Wound #1: Same as Pre-Procedure Pre-procedure diagnosis of Wound #1 is a Dehisced Wound located on the Left,Medial Lower Leg . There was a Three Layer Compression Therapy Procedure with a pre-treatment ABI of 1.3 by [name] Amen, RN. Post procedure Diagnosis Wound #1: Same as Pre-Procedure Plan Follow-up Appointments: Return Appointment in 1 week. Bathing/ Shower/ Hygiene: May shower with wound dressing protected with water repellent cover or cast protector. Edema Control - Lymphedema / Segmental Compressive Device / Other: Optional: One layer of unna paste to top of compression wrap (to act as an anchor). 3 Layer Compression System for Lymphedema. Elevate legs to the level of the heart and pump ankles as often as possible Elevate leg(s) parallel to the floor when sitting. WOUND #1: - Lower Leg Wound Laterality: Left, Medial Cleanser: Soap and Water 1 x Per Week/15 Days Discharge Instructions: Gently cleanse wound with antibacterial soap, rinse and pat dry prior to dressing wounds Peri-Wound Care: Desitin Maximum Strength Ointment 4 (oz) 1 x Per Week/15 Days Discharge Instructions: Apply around wound Primary Dressing: Prisma 4.34 (in) 1 x Per Week/15  Days Discharge Instructions: Moisten w/normal saline or sterile water; Cover wound as directed. Do not remove from wound bed. Secondary Dressing: ABD Pad 5x9 (in/in) 1 x Per Week/15 Days Discharge Instructions: Cover with ABD pad Compression Wrap: Profore Lite LF 3 Multilayer Compression Bandaging System 1 x Per Week/15 Days Discharge Instructions: Apply 3 multi-layer wrap as prescribed. 1. 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 2. I am also can recommend a continuation of the 3 layer compression wrap which I think is doing a good job. 3. I am also can recommend that the patient have zinc around the edges of the wound along with a ABD pad. We will see patient back for reevaluation in 1 week here in the clinic. If anything worsens or changes patient will contact our office for additional recommendations. Electronic Signature(s) [name], [name] ([id number]) Signed: 10/09/2020 10:18:12 AM By: Worthy Keeler PA-C Entered By: Worthy [name] on 89/16/9450 38:88:28 [name], [name] ([id number]) -------------------------------------------------------------------------------- SuperBill Details Patient Name: [name] Date of Service: 10/09/2020 Medical Record Number: [id number] Patient Account Number: [id number] Date of Birth/Sex: [birth year] (69 y.o. F) Treating RN: [name] Primary Care Provider: Miguel [name] Other Clinician: Referring Provider: Miguel [name] Treating Provider/Extender: [name] in Treatment: 3 Diagnosis Coding ICD-10 Codes Code Description T81.31XA Disruption of external operation (surgical) wound, not elsewhere classified, initial encounter L97.822 Non-pressure chronic ulcer of other part of left lower leg with fat layer exposed L03.116 Cellulitis  of left lower limb C44.729 Squamous cell carcinoma of skin of left lower limb, including hip Facility Procedures CPT4 Code: [id number] Description: 11042 - DEB SUBQ TISSUE 20 SQ CM/< Modifier: Quantity: 1 CPT4 Code: Description: ICD-10 Diagnosis Description L97.822 Non-pressure chronic ulcer of other part of left lower leg with fat layer Modifier: exposed Quantity: Physician Procedures CPT4 Code: [id number] Description: 11042 - WC PHYS SUBQ TISS 20 SQ CM Modifier: Quantity: 1 CPT4 Code: Description: ICD-10 Diagnosis Description L97.822 Non-pressure chronic ulcer of other part of left lower leg with fat layer Modifier: exposed Quantity: Electronic Signature(s) Signed: 10/09/2020 10:18:26 AM By: Worthy Keeler PA-C Entered By: Worthy [name] on 10/09/2020 10:18:26

## 2020-10-15 NOTE — Progress Notes (Signed)
[name], [name] ([id number]) Visit Report for 10/09/2020 Arrival Information Details Patient Name: [name], [name] Date of Service: 10/09/2020 9:15 AM Medical Record Number: [id number] Patient Account Number: [id number] Date of Birth/Sex: Aug 03, 1950 (69 y.o. F) Treating RN: Carlene Coria Primary Care Jeslyn Amsler: Miguel Aschoff Other Clinician: Referring Akaya Proffit: Miguel Aschoff Treating Jarryd Gratz/Extender: Skipper Cliche in Treatment: 3 Visit Information History Since Last Visit All ordered tests and consults were completed: No Patient Arrived: Ambulatory Added or deleted any medications: No Arrival Time: 09:22 Any new allergies or adverse reactions: No Accompanied By: self Had a fall or experienced change in No Transfer Assistance: None activities of daily living that may affect Patient Identification Verified: Yes risk of falls: Secondary Verification Process Completed: Yes Signs or symptoms of abuse/neglect since last visito No Patient Requires Transmission-Based Precautions: No Hospitalized since last visit: No Patient Has Alerts: No Implantable device outside of the clinic excluding No cellular tissue based products placed in the center since last visit: Has Dressing in Place as Prescribed: Yes Has Compression in Place as Prescribed: Yes Pain Present Now: No Electronic Signature(s) Signed: 10/15/2020 3:51:10 PM By: Carlene Coria RN Entered By: Carlene Coria on 73/22/0254 27:06:23 [name], [name] ([id number]) -------------------------------------------------------------------------------- Clinic Level of Care Assessment Details Patient Name: [name] Date of Service: 10/09/2020 9:15 AM Medical Record Number: [id number] Patient Account Number: [id number] Date of Birth/Sex: 03-14-1951 (69 y.o. F) Treating RN: Dolan Amen Primary Care Karthikeya Funke: Miguel Aschoff Other Clinician: Referring Sameka Bagent: Miguel Aschoff Treating  Jakorian Marengo/Extender: Skipper Cliche in Treatment: 3 Clinic Level of Care Assessment Items TOOL 1 Quantity Score _0  - Use when EandM and Procedure is performed on INITIAL visit 0 ASSESSMENTS - Nursing Assessment / Reassessment _1  - General Physical Exam (combine w/ comprehensive assessment (listed just below) when performed on new 0 pt. evals) _2  - 0 Comprehensive Assessment (HX, ROS, Risk Assessments, Wounds Hx, etc.) ASSESSMENTS - Wound and Skin Assessment / Reassessment _3  - Dermatologic / Skin Assessment (not related to wound area) 0 ASSESSMENTS - Ostomy and/or Continence Assessment and Care _4  - Incontinence Assessment and Management 0 _5  - 0 Ostomy Care Assessment and Management (repouching, etc.) PROCESS - Coordination of Care _6  - Simple Patient / Family Education for ongoing care 0 _7  - 0 Complex (extensive) Patient / Family Education for ongoing care _8  - 0 Staff obtains Programmer, systems, Records, Test Results / Process Orders _9  - 0 Staff telephones HHA, Nursing Homes / Clarify orders / etc _10  - 0 Routine Transfer to another Facility (non-emergent condition) _11  - 0 Routine Hospital Admission (non-emergent condition) _12  - 0 New Admissions / Biomedical engineer / Ordering NPWT, Apligraf, etc. _13  - 0 Emergency Hospital Admission (emergent condition) PROCESS - Special Needs _14  - Pediatric / Minor Patient Management 0 _15  - 0 Isolation Patient Management _16  - 0 Hearing / Language / Visual special needs _17  - 0 Assessment of Community assistance (transportation, D/C planning, etc.) _18  - 0 Additional assistance / Altered mentation _19  - 0 Support Surface(s) Assessment (bed, cushion, seat, etc.) INTERVENTIONS - Miscellaneous _20  - External ear exam 0 _21  - 0 Patient Transfer (multiple staff / Civil Service fast streamer / Similar devices) _22  - 0 Simple Staple / Suture removal (25 or less) _23  - 0 Complex Staple / Suture removal (26 or more) _24  - 0 Hypo/Hyperglycemic Management (do not  check if billed separately) _25  - 0 Ankle / Brachial Index (ABI) - do not check if billed separately Has the patient been seen at the hospital within the last three years: Yes Total  Score: 0 Level Of Care: ____ [name] ([id number]) Electronic Signature(s) Signed: 10/09/2020 5:15:58 PM By: Georges Mouse, Minus Breeding RN Entered By: Georges Mouse, Minus Breeding on 97/98/9211 94:17:40 [name], [name] ([id number]) -------------------------------------------------------------------------------- Compression Therapy Details Patient Name: [name] Date of Service: 10/09/2020 9:15 AM Medical Record Number: [id number] Patient Account Number: [id number] Date of Birth/Sex: Apr 02, 1951 (69 y.o. F) Treating RN: Dolan Amen Primary Care Chela Sutphen: Miguel Aschoff Other Clinician: Referring Arzell Mcgeehan: Miguel Aschoff Treating Pleasant Britz/Extender: Jeri Cos Weeks in Treatment: 3 Compression Therapy Performed for Wound Assessment: Wound #1 Left,Medial Lower Leg Performed By: Clinician Dolan Amen, RN Compression Type: Three Layer Pre Treatment ABI: 1.3 Post Procedure Diagnosis Same as Pre-procedure Electronic Signature(s) Signed: 10/09/2020 5:15:58 PM By: Georges Mouse, Minus Breeding RN Entered By: Georges Mouse, Minus Breeding on 78/58/8502 77:41:28 [name], [name] ([id number]) -------------------------------------------------------------------------------- Encounter Discharge Information Details Patient Name: [name] Date of Service: 10/09/2020 9:15 AM Medical Record Number: [id number] Patient Account Number: [id number] Date of Birth/Sex: 03-Jul-1950 (69 y.o. F) Treating RN: Dolan Amen Primary Care Piotr Christopher: Miguel Aschoff Other Clinician: Referring Shaquina Gillham: Miguel Aschoff Treating Philicia Heyne/Extender: Skipper Cliche in Treatment: 3 Encounter Discharge Information Items Post Procedure Vitals Discharge Condition: Stable Temperature (F):  98.3 Ambulatory Status: Ambulatory Pulse (bpm): 81 Discharge Destination: Home Respiratory Rate (breaths/min): 18 Transportation: Private Auto Blood Pressure (mmHg): 115/78 Accompanied By: self Schedule Follow-up Appointment: Yes Clinical Summary of Care: Electronic Signature(s) Signed: 10/09/2020 4:23:12 PM By: Jeanine Luz Entered By: Jeanine Luz on 62/94/7654 65:03:54 [name], SFKCL ([id number]) -------------------------------------------------------------------------------- Lower Extremity Assessment Details Patient Name: [name] Date of Service: 10/09/2020 9:15 AM Medical Record Number: [id number] Patient Account Number: [id number] Date of Birth/Sex: June 10, 1951 (69 y.o. F) Treating RN: Carlene Coria Primary Care Kealani Leckey: Miguel Aschoff Other Clinician: Referring Drexel Ivey: Miguel Aschoff Treating Jeral Zick/Extender: Jeri Cos Weeks in Treatment: 3 Edema Assessment Assessed: [Left: No] [Right: No] [Left: Edema] [Right: :] Calf Left: Right: Point of Measurement: 39 cm From Medial Instep 36 cm Ankle Left: Right: Point of Measurement: 10 cm From Medial Instep 21 cm Vascular Assessment Pulses: Dorsalis Pedis Palpable: [Left:Yes] Electronic Signature(s) Signed: 10/15/2020 3:51:10 PM By: Carlene Coria RN Entered By: Carlene Coria on 16/38/4665 99:35:70 [name], [name] ([id number]) -------------------------------------------------------------------------------- Multi Wound Chart Details Patient Name: [name] Date of Service: 10/09/2020 9:15 AM Medical Record Number: [id number] Patient Account Number: [id number] Date of Birth/Sex: 08-18-1950 (69 y.o. F) Treating RN: Dolan Amen Primary Care Isiaih Hollenbach: Miguel Aschoff Other Clinician: Referring Cleburn Maiolo: Miguel Aschoff Treating Murle Otting/Extender: Skipper Cliche in Treatment: 3 Vital Signs Height(in): Pulse(bpm): 22 Weight(lbs): Blood Pressure(mmHg): 115/78 Body  Mass Index(BMI): Temperature(F): 98.3 Respiratory Rate(breaths/min): 18 Photos: [N/A:N/A] Wound Location: Left, Medial Lower Leg N/A N/A Wounding Event: Surgical Injury N/A N/A Primary Etiology: Dehisced Wound N/A N/A Comorbid History: Cataracts, Gout N/A N/A Date Acquired: 05/27/2020 N/A N/A Weeks of Treatment: 3 N/A N/A Wound Status: Open N/A N/A Measurements L x W x D (cm) 1.5x0.6x0.1 N/A N/A Area (cm) : 0.707 N/A N/A Volume (cm) : 0.071 N/A N/A % Reduction in Area: 89.70% N/A N/A % Reduction in Volume: 94.80% N/A N/A Classification: Full Thickness Without Exposed N/A N/A Support Structures Exudate Amount: Medium N/A N/A Exudate Type: Serosanguineous N/A N/A Exudate Color: red, brown N/A N/A Granulation Amount: Large (67-100%) N/A N/A Granulation Quality: Red, Hyper-granulation N/A N/A Necrotic Amount: Small (1-33%) N/A N/A Exposed Structures: Fat Layer (Subcutaneous Tissue): N/A N/A Yes Fascia: No Tendon: No Muscle: No Joint: No Bone: No Epithelialization: Small (1-33%) N/A N/A Treatment Notes Electronic Signature(s) Signed: 10/09/2020 5:15:58 PM By: Georges Mouse, Minus Breeding  RN Entered By: Georges Mouse, Minus Breeding on 40/97/3532 99:24:26 Kaycee, [name] ([id number]) -------------------------------------------------------------------------------- Multi-Disciplinary Care Plan Details Patient Name: [name] Date of Service: 10/09/2020 9:15 AM Medical Record Number: [id number] Patient Account Number: [id number] Date of Birth/Sex: [birth year] (69 y.o. F) Treating RN: Dolan Amen Primary Care Piya Mesch: Miguel Aschoff Other Clinician: Referring Junita Kubota: Miguel Aschoff Treating Estoria Geary/Extender: Skipper Cliche in Treatment: 3 Active Inactive Wound/Skin Impairment Nursing Diagnoses: Impaired tissue integrity Goals: Patient/caregiver will verbalize understanding of skin care regimen Date Initiated: 09/18/2020 Date Inactivated: 09/25/2020 Target  Resolution Date: 09/18/2020 Goal Status: Met Ulcer/skin breakdown will have a volume reduction of 30% by week 4 Date Initiated: 09/18/2020 Target Resolution Date: 10/18/2020 Goal Status: Active Ulcer/skin breakdown will have a volume reduction of 50% by week 8 Date Initiated: 09/18/2020 Target Resolution Date: 11/18/2020 Goal Status: Active Ulcer/skin breakdown will have a volume reduction of 80% by week 12 Date Initiated: 09/18/2020 Target Resolution Date: 12/18/2020 Goal Status: Active Ulcer/skin breakdown will heal within 14 weeks Date Initiated: 09/18/2020 Target Resolution Date: 01/18/2021 Goal Status: Active Interventions: Assess patient/caregiver ability to obtain necessary supplies Assess patient/caregiver ability to perform ulcer/skin care regimen upon admission and as needed Assess ulceration(s) every visit Provide education on ulcer and skin care Treatment Activities: Referred to DME Shrey Boike for dressing supplies : 09/18/2020 Skin care regimen initiated : 09/18/2020 Notes: Electronic Signature(s) Signed: 10/09/2020 5:15:58 PM By: Georges Mouse, Minus Breeding RN Entered By: Georges Mouse, Minus Breeding on 41/74/0814 48:18:56 [place] ([id number]) -------------------------------------------------------------------------------- Pain Assessment Details Patient Name: [name] Date of Service: 10/09/2020 9:15 AM Medical Record Number: [id number] Patient Account Number: [id number] Date of Birth/Sex: Sep 08, 1950 (69 y.o. F) Treating RN: Carlene Coria Primary Care Nara Paternoster: Miguel Aschoff Other Clinician: Referring Marketa Midkiff: Miguel Aschoff Treating Kiaan Overholser/Extender: Skipper Cliche in Treatment: 3 Active Problems Location of Pain Severity and Description of Pain Patient Has Paino No Site Locations Pain Management and Medication Current Pain Management: Electronic Signature(s) Signed: 10/15/2020 3:51:10 PM By: Carlene Coria RN Entered By: Carlene Coria on  74/05/8785 76:72:09 [name], [name] ([id number]) -------------------------------------------------------------------------------- Patient/Caregiver Education Details Patient Name: [name] Date of Service: 10/09/2020 9:15 AM Medical Record Number: [id number] Patient Account Number: [id number] Date of Birth/Gender: [birth year] (69 y.o. F) Treating RN: Dolan Amen Primary Care Physician: Miguel Aschoff Other Clinician: Referring Physician: Miguel Aschoff Treating Physician/Extender: Skipper Cliche in Treatment: 3 Education Assessment Education Provided To: Patient Education Topics Provided Wound/Skin Impairment: Methods: Explain/Verbal Responses: State content correctly Electronic Signature(s) Signed: 10/09/2020 5:15:58 PM By: Georges Mouse, Minus Breeding RN Entered By: Georges Mouse, Minus Breeding on 50/35/4656 81:27:51 [name], [name] ([id number]) -------------------------------------------------------------------------------- Wound Assessment Details Patient Name: [name] Date of Service: 10/09/2020 9:15 AM Medical Record Number: [id number] Patient Account Number: [id number] Date of Birth/Sex: May 27, 1951 (69 y.o. F) Treating RN: Carlene Coria Primary Care Jasper Hanf: Miguel Aschoff Other Clinician: Referring Guy Toney: Miguel Aschoff Treating Aashritha Miedema/Extender: Skipper Cliche in Treatment: 3 Wound Status Wound Number: 1 Primary Etiology: Dehisced Wound Wound Location: Left, Medial Lower Leg Wound Status: Open Wounding Event: Surgical Injury Comorbid History: Cataracts, Gout Date Acquired: 05/27/2020 Weeks Of Treatment: 3 Clustered Wound: No Photos Wound Measurements Length: (cm) 1.5 Width: (cm) 0.6 Depth: (cm) 0.1 Area: (cm) 0.707 Volume: (cm) 0.071 % Reduction in Area: 89.7% % Reduction in Volume: 94.8% Epithelialization: Small (1-33%) Tunneling: No Undermining: No Wound Description Classification: Full Thickness Without  Exposed Support Structu Exudate Amount: Medium Exudate Type: Serosanguineous Exudate Color: red, brown res Foul Odor After Cleansing: No Slough/Fibrino Yes Wound Bed Granulation Amount: Large (67-100%) Exposed Structure Granulation Quality:  Red, Hyper-granulation Fascia Exposed: No Necrotic Amount: Small (1-33%) Fat Layer (Subcutaneous Tissue) Exposed: Yes Necrotic Quality: Adherent Slough Tendon Exposed: No Muscle Exposed: No Joint Exposed: No Bone Exposed: No Treatment Notes Wound #1 (Lower Leg) Wound Laterality: Left, Medial Cleanser Soap and Water Discharge Instruction: Gently cleanse wound with antibacterial soap, rinse and pat dry prior to dressing wounds Peri-Wound Care Desitin Maximum Strength Ointment 4 (oz) [name], [place] ([id number]) Discharge Instruction: Apply around wound Topical Primary Dressing Prisma 4.34 (in) Discharge Instruction: Moisten w/normal saline or sterile water; Cover wound as directed. Do not remove from wound bed. Secondary Dressing ABD Pad 5x9 (in/in) Discharge Instruction: Cover with ABD pad Secured With Compression Wrap Profore Lite LF 3 Multilayer Compression Bandaging System Discharge Instruction: Apply 3 multi-layer wrap as prescribed. Compression Stockings Add-Ons Electronic Signature(s) Signed: 10/15/2020 3:51:10 PM By: Carlene Coria RN Entered By: Carlene Coria on 24/23/5361 44:31:54 [name], [name] ([id number]) -------------------------------------------------------------------------------- Vitals Details Patient Name: [name] Date of Service: 10/09/2020 9:15 AM Medical Record Number: [id number] Patient Account Number: [id number] Date of Birth/Sex: 1951-05-01 (69 y.o. F) Treating RN: Carlene Coria Primary Care Amy Belloso: Miguel Aschoff Other Clinician: Referring Jerami Tammen: Miguel Aschoff Treating Othar Curto/Extender: Skipper Cliche in Treatment: 3 Vital Signs Time Taken: 09:30 Temperature (F):  98.3 Pulse (bpm): 81 Respiratory Rate (breaths/min): 18 Blood Pressure (mmHg): 115/78 Reference Range: 80 - 120 mg / dl Electronic Signature(s) Signed: 10/15/2020 3:51:10 PM By: Carlene Coria RN Entered By: Carlene Coria on 10/09/2020 09:30:59

## 2020-10-16 ENCOUNTER — Other Ambulatory Visit: Payer: Self-pay

## 2020-10-16 ENCOUNTER — Encounter: Payer: Medicare Other | Admitting: Internal Medicine

## 2020-10-16 DIAGNOSIS — L03116 Cellulitis of left lower limb: Secondary | ICD-10-CM | POA: Diagnosis not present

## 2020-10-16 DIAGNOSIS — I872 Venous insufficiency (chronic) (peripheral): Secondary | ICD-10-CM | POA: Diagnosis not present

## 2020-10-16 DIAGNOSIS — L97822 Non-pressure chronic ulcer of other part of left lower leg with fat layer exposed: Secondary | ICD-10-CM | POA: Diagnosis not present

## 2020-10-16 DIAGNOSIS — T8131XA Disruption of external operation (surgical) wound, not elsewhere classified, initial encounter: Secondary | ICD-10-CM | POA: Diagnosis not present

## 2020-10-16 DIAGNOSIS — Z85828 Personal history of other malignant neoplasm of skin: Secondary | ICD-10-CM | POA: Diagnosis not present

## 2020-10-16 NOTE — Progress Notes (Signed)
[name] ([id number]) Visit Report for 10/16/2020 HPI Details Patient Name: [name], [name] Date of Service: 10/16/2020 10:00 AM Medical Record Number: [id number] Patient Account Number: [id number] Date of Birth/Sex: 1950-09-15 (69 y.o. F) Treating RN: [name] Primary Care Provider: Miguel [name] Other Clinician: Referring Provider: Miguel [name] Treating Provider/Extender: [name] in Treatment: 4 History of Present Illness HPI Description: 09/18/2020 upon evaluation today patient presents for initial inspection here in clinic concerning issues that she has been having unfortunately with a wound on [name] which has been present actually since December 7 when she had a squamous cell carcinoma removed. Subsequently I did review the pictures that she had on [name] of the carcinoma site post excision and it was quite significant. With that being said the tension that was needed to pull the edges of the wound together to approximate apparently caused some issues as far as appropriate healing was concerned and this became necrotic and unfortunately dehisced. Subsequently the patient has since been undergoing treatment at the [facility] where to be honest they have been attempting as much as possible to manage both infections as well as the overall swelling of the leg. They have been placing the patient in Unna boots for the past 3 weeks. Subsequently she also did have a culture which was on 08/27/2020 that showed Enterococcus and then she was on doxycycline at that point. Subsequently she was switched to amoxicillin based on the culture on 09/02/2020 she is done with that at this point. With that being said the patient is very frustrated with how long this has been going on she notes that today during our conversation. This was a squamous cell carcinoma that led to this need for surgery. 09/25/2020 upon evaluation today patient  appears to be doing well with regard to [name]. She is tolerating the dressing changes without complication fortunately there does not appear to be any signs of infection which is great news. No fevers, chills, nausea, vomiting, or diarrhea. She is going to require little bit of debridement today in regard to an area that is somewhat appearing to be a hematoma in the upper portion of the wound this is very tiny but nonetheless does seem to have some fluid collecting in this area. 10/02/2020 upon evaluation today patient appears to be doing well currently with regard to [name]. This is showing signs of excellent improvement which is great news and overall very pleased with where things stand today. 10/09/2020 upon evaluation today patient appears to be doing well with regard to [name]. This is measuring smaller yet again. With that being said she does have a little bit of an opening where last week we found a small hematoma unfortunately that is still taking his time as far as healing is concerned. The Hydrofera Blue is done excellent I feel like we may want to switch over to collagen at this point with some of the deeper areas noted. 4/28; the patient is left with a small open area although it is left to the original wound. This may have been the area that they found a small hematoma recently in this clinic. Been using silver collagen. There is no other superficial areas all of this is epithelialized Electronic Signature(s) Signed: 10/16/2020 5:07:17 PM [name] Entered By: [name] on 43/32/9518 84:16:60 [name] ([id number]) -------------------------------------------------------------------------------- Physical Exam Details Patient Name: [name] Date of Service: 10/16/2020 10:00 AM Medical Record Number:  [id number] Patient Account Number: [id number] Date of Birth/Sex: 1951-05-26 (69 y.o. F) Treating RN: [name] Primary  Care Provider: Miguel [name] Other Clinician: Referring Provider: Miguel [name] Treating Provider/Extender: [name] in Treatment: 4 Notes Wound exam; the patient has only a small area which is punched out. 2 to 3 mm of depth. Using a skinny no undermining is identified there is no evidence of infection. She has some degree of venous insufficiency but not much in a major way of edema in the right leg. No evidence of surrounding infection Electronic Signature(s) Signed: 10/16/2020 5:07:17 PM [name] Entered By: [name] on [id number] [name] ([id number]) -------------------------------------------------------------------------------- Physician Orders Details Patient Name: [name] Date of Service: 10/16/2020 10:00 AM Medical Record Number: [id number] Patient Account Number: [id number] Date of Birth/Sex: 02-05-1951 (69 y.o. F) Treating RN: [name] Primary Care Provider: Miguel [name] Other Clinician: Referring Provider: Miguel [name] Treating Provider/Extender: [name] in Treatment: 4 Verbal / Phone Orders: No Diagnosis Coding Follow-up Appointments o Return Appointment in 1 week. Bathing/ Shower/ Hygiene o May shower with wound dressing protected with water repellent cover or cast protector. Edema Control - Lymphedema / Segmental Compressive Device / Other Left Lower Extremity o Optional: One layer of unna paste to top of compression wrap (to act as an anchor). o 3 Layer Compression System for Lymphedema. o Elevate legs to the level of the heart and pump ankles as often as possible o Elevate leg(s) parallel to the floor when sitting. Wound Treatment Wound #1 - Lower Leg Wound Laterality: Left, Medial Cleanser: Soap and Water 1 x Per Week/15 Days Discharge Instructions: Gently cleanse wound with antibacterial soap, rinse and pat dry prior to dressing  wounds Peri-Wound Care: Desitin Maximum Strength Ointment 4 (oz) 1 x Per Week/15 Days Discharge Instructions: Apply around wound Primary Dressing: Prisma 4.34 (in) 1 x Per Week/15 Days Discharge Instructions: Moisten w/normal saline or sterile water; Cover wound as directed. Do not remove from wound bed. Secondary Dressing: ABD Pad 5x9 (in/in) 1 x Per Week/15 Days Discharge Instructions: Cover with ABD pad Compression Wrap: Profore Lite LF 3 Multilayer Compression Bandaging System 1 x Per Week/15 Days Discharge Instructions: Apply 3 multi-layer wrap as prescribed. Electronic Signature(s) Signed: 10/16/2020 5:05:31 PM By: Georges Mouse, Minus Breeding RN Signed: 10/16/2020 5:07:17 PM [name] Entered By: Georges Mouse, Minus Breeding on [id number] [name] ([id number]) -------------------------------------------------------------------------------- Problem List Details Patient Name: [name] Date of Service: 10/16/2020 10:00 AM Medical Record Number: [id number] Patient Account Number: [id number] Date of Birth/Sex: 1950/12/07 (69 y.o. 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 F) Treating RN: [name] Primary Care Provider: Miguel [name] Other Clinician:  Referring Provider: Miguel [name] Treating Provider/Extender: [name] in Treatment: 4 Subjective History of Present Illness (HPI) 09/18/2020 upon evaluation today patient presents for initial inspection here in clinic concerning issues that she has been having unfortunately with a wound on [name] which has been present actually since December 7 when she had a squamous cell carcinoma removed. Subsequently I did review the pictures that she had on [name] of the carcinoma site post excision and it was quite significant. With that being said the tension that was needed to pull the edges of the wound together to approximate apparently caused some issues as far as appropriate healing was concerned and this became necrotic and unfortunately dehisced. Subsequently the patient has since been undergoing treatment at the [facility] where to be honest they have been attempting as much as possible to manage both infections as well as the overall swelling of the leg. They have been placing the patient in Unna boots for the past 3 weeks. Subsequently she also did have a culture which was on 08/27/2020 that showed Enterococcus and then she was on doxycycline at that point. Subsequently she was switched to amoxicillin based on the culture on 09/02/2020 she is done with that at this point. With that being said the patient is very frustrated with how long this has been going on she notes that today during our conversation. This was a squamous cell carcinoma that led to this need for  surgery. 09/25/2020 upon evaluation today patient appears to be doing well with regard to [name]. She is tolerating the dressing changes without complication fortunately there does not appear to be any signs of infection which is great news. No fevers, chills, nausea, vomiting, or diarrhea. She is going to require little bit of debridement today in regard to an area that is somewhat appearing to be a hematoma in the upper portion of the wound this is very tiny but nonetheless does seem to have some fluid collecting in this area. 10/02/2020 upon evaluation today patient appears to be doing well currently with regard to [name]. This is showing signs of excellent improvement which is great news and overall very pleased with where things stand today. 10/09/2020 upon evaluation today patient appears to be doing well with regard to [name]. This is measuring smaller yet again. With that being said she does have a little bit of an opening where last week we found a small hematoma unfortunately that is still taking his time as far as healing is concerned. The Hydrofera Blue is done excellent I feel like we may want to switch over to collagen at this point with some of the deeper areas noted. 4/28; the patient is left with a small open area although it is left to the original wound. This may have been the area that they found a small hematoma recently in this clinic. Been using silver collagen. There is no other superficial areas all of this is epithelialized Objective Constitutional Vitals Time Taken: 10:15 AM, Temperature: 98.2 F, Pulse: 91 bpm, Respiratory Rate: 20 breaths/min, Blood Pressure: 120/73 mmHg. Integumentary (Hair, Skin) Wound #1 status is Open. Original cause of wound was Surgical Injury. The date acquired was: 05/27/2020. The wound has been in treatment 4 weeks. The wound is located on the Left,Medial Lower Leg. The wound measures 1cm length x 0.4cm width x 0.2cm depth;  0.314cm^2 area and 0.063cm^3 volume. There is Fat Layer (Subcutaneous Tissue) exposed.  There is no tunneling or undermining noted. There is a medium amount of serosanguineous drainage noted. There is medium (34-66%) red, hyper - granulation within the wound bed. There is a medium (34-66%) amount of necrotic tissue within the wound bed including Adherent Slough. 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 There was a Four Layer Compression Therapy Procedure with a pre-treatment ABI of 1.3 by [name] Amen, RN. Post procedure Diagnosis Wound #1: Same as Pre-Procedure Plan Follow-up Appointments: Return Appointment in 1 week. Bathing/ Shower/ Hygiene: May shower with wound dressing protected with water repellent cover or cast protector. Edema Control - Lymphedema / Segmental Compressive Device / Other: Optional: One layer of unna paste to top of compression wrap (to act as an anchor). 3 Layer Compression System for Lymphedema. Elevate legs to the level of the heart and pump ankles as often as possible Elevate leg(s) parallel to the floor when sitting. WOUND #1: - Lower Leg Wound Laterality: Left, Medial Cleanser: Soap and Water 1 x Per Week/15 Days Discharge Instructions: Gently cleanse wound with antibacterial soap, rinse and pat dry prior to dressing wounds Peri-Wound Care: Desitin Maximum Strength Ointment 4 (oz) 1 x Per Week/15 Days Discharge Instructions: Apply around wound Primary Dressing: Prisma 4.34 (in) 1 x Per Week/15 Days Discharge Instructions: Moisten w/normal  saline or sterile water; Cover wound as directed. Do not remove from wound bed. Secondary Dressing: ABD Pad 5x9 (in/in) 1 x Per Week/15 Days Discharge Instructions: Cover with ABD pad Compression Wrap: Profore Lite LF 3 Multilayer Compression Bandaging System 1 x Per Week/15 Days Discharge Instructions: Apply 3 multi-layer wrap as prescribed. 1. I have continued the silver collagen under 3 layer compression 2. Small punched-out wound is all that is left of this original area which was the result of a removal of a squamous cell carcinoma. 3. The area was carefully examined there is no undermining here. Hopefully this will fill in Electronic Signature(s) Signed: 10/16/2020 5:07:17 PM [name] Entered By: [name] on 69/48/5462 70:35:00 [name] ([id number]) -------------------------------------------------------------------------------- SuperBill Details Patient Name: [name] Date of Service: 10/16/2020 Medical Record Number: [id number] Patient Account Number: [id number] Date of Birth/Sex: [birth year] (69 y.o. 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, including hip Facility Procedures CPT4 Code: 81017510 Description: (Facility Use Only) [phone] - [place] LWR LT LEG Modifier: Quantity: 1 Physician Procedures CPT4 Code: 8242353 Description: 61443 - WC PHYS LEVEL 3 - EST PT Modifier: Quantity: 1 CPT4 Code: Description: ICD-10 Diagnosis  Description L97.822 Non-pressure chronic ulcer of other part of left lower leg with fat layer T81.31XA Disruption of external operation (surgical) wound, not elsewhere classifi Modifier: exposed ed, initial encounter Quantity: Electronic Signature(s) Signed: 10/16/2020 5:07:17 PM [name] Entered By: [name] on  10/16/2020 10:40:36 

## 2020-10-16 NOTE — Progress Notes (Signed)
CHELA, SUTPHEN ([id number]) Visit Report for 10/16/2020 Arrival Information Details Patient Name: [name], [name] Date of Service: 10/16/2020 10:00 AM Medical Record Number: [id number] Patient Account Number: [id number] Date of Birth/Sex: 27-May-1951 (70 y.o. F) Treating RN: Dolan Amen Primary Care Marques Ericson: Miguel Aschoff Other Clinician: Referring Arrayah Connors: Miguel Aschoff Treating Alexis Reber/Extender: Tito Dine in Treatment: 4 Visit Information History Since Last Visit All ordered tests and consults were completed: No Patient Arrived: Ambulatory Added or deleted any medications: No Arrival Time: 10:14 Any new allergies or adverse reactions: No Accompanied By: self Had a fall or experienced change in No Transfer Assistance: None activities of daily living that may affect Patient Identification Verified: Yes risk of falls: Secondary Verification Process Completed: Yes Signs or symptoms of abuse/neglect since last visito No Patient Requires Transmission-Based Precautions: No Hospitalized since last visit: No Patient Has Alerts: No Implantable device outside of the clinic excluding No cellular tissue based products placed in the center since last visit: Has Dressing in Place as Prescribed: Yes Has Compression in Place as Prescribed: Yes Pain Present Now: No Electronic Signature(s) Signed: 10/16/2020 4:35:13 PM By: Jeanine Luz Entered By: Jeanine Luz on 06/23/7251 66:44:03 [name], [name] ([id number]) -------------------------------------------------------------------------------- Clinic Level of Care Assessment Details Patient Name: [name] Date of Service: 10/16/2020 10:00 AM Medical Record Number: [id number] Patient Account Number: [id number] Date of Birth/Sex: 1951-06-06 (70 y.o. F) Treating RN: Dolan Amen Primary Care Nyesha Cliff: Miguel Aschoff Other Clinician: Referring Ahmia Colford: Miguel Aschoff Treating  Fayetta Sorenson/Extender: Tito Dine in Treatment: 4 Clinic Level of Care Assessment Items TOOL 1 Quantity Score []  - Use when EandM and Procedure is performed on INITIAL visit 0 ASSESSMENTS - Nursing Assessment / Reassessment []  - General Physical Exam (combine w/ comprehensive assessment (listed just below) when performed on new 0 pt. evals) []  - 0 Comprehensive Assessment (HX, ROS, Risk Assessments, Wounds Hx, etc.) ASSESSMENTS - Wound and Skin Assessment / Reassessment []  - Dermatologic / Skin Assessment (not related to wound area) 0 ASSESSMENTS - Ostomy and/or Continence Assessment and Care []  - Incontinence Assessment and Management 0 []  - 0 Ostomy Care Assessment and Management (repouching, etc.) PROCESS - Coordination of Care []  - Simple Patient / Family Education for ongoing care 0 []  - 0 Complex (extensive) Patient / Family Education for ongoing care []  - 0 Staff obtains Programmer, systems, Records, Test Results / Process Orders []  - 0 Staff telephones HHA, Nursing Homes / Clarify orders / etc []  - 0 Routine Transfer to another Facility (non-emergent condition) []  - 0 Routine Hospital Admission (non-emergent condition) []  - 0 New Admissions / Biomedical engineer / Ordering NPWT, Apligraf, etc. []  - 0 Emergency Hospital Admission (emergent condition) PROCESS - Special Needs []  - Pediatric / Minor Patient Management 0 []  - 0 Isolation Patient Management []  - 0 Hearing / Language / Visual special needs []  - 0 Assessment of Community assistance (transportation, D/C planning, etc.) []  - 0 Additional assistance / Altered mentation []  - 0 Support Surface(s) Assessment (bed, cushion, seat, etc.) INTERVENTIONS - Miscellaneous []  - External ear exam 0 []  - 0 Patient Transfer (multiple staff / Civil Service fast streamer / Similar devices) []  - 0 Simple Staple / Suture removal (25 or less) []  - 0 Complex Staple / Suture removal (26 or more) []  - 0 Hypo/Hyperglycemic Management  (do not check if billed separately) []  - 0 Ankle / Brachial Index (ABI) - do not check if billed separately Has the patient been seen at the hospital within the last three years: Yes  Total Score: 0 Level Of Care: ____ [name] ([id number]) Electronic Signature(s) Signed: 10/16/2020 5:05:31 PM By: Georges Mouse, Minus Breeding RN Entered By: Georges Mouse, Minus Breeding on 26/94/8546 27:03:50 [name], [name] ([id number]) -------------------------------------------------------------------------------- Compression Therapy Details Patient Name: [name] Date of Service: 10/16/2020 10:00 AM Medical Record Number: [id number] Patient Account Number: [id number] Date of Birth/Sex: 1950-08-10 (70 y.o. F) Treating RN: Dolan Amen Primary Care Kenrick Pore: Miguel Aschoff Other Clinician: Referring Laveta Gilkey: Miguel Aschoff Treating Shaquan Puerta/Extender: Tito Dine in Treatment: 4 Compression Therapy Performed for Wound Assessment: Wound #1 Left,Medial Lower Leg Performed By: Cora Daniels, RN Compression Type: Four Layer Pre Treatment ABI: 1.3 Post Procedure Diagnosis Same as Pre-procedure Electronic Signature(s) Signed: 10/16/2020 5:05:31 PM By: Georges Mouse, Minus Breeding RN Entered By: Georges Mouse, Minus Breeding on 38/03/1750 02:58:52 [place], [name] ([id number]) -------------------------------------------------------------------------------- Encounter Discharge Information Details Patient Name: [name] Date of Service: 10/16/2020 10:00 AM Medical Record Number: [id number] Patient Account Number: [id number] Date of Birth/Sex: December 10, 1950 (70 y.o. F) Treating RN: Dolan Amen Primary Care Carlous Olivares: Miguel Aschoff Other Clinician: Referring Yaresly Menzel: Miguel Aschoff Treating Ayana Imhof/Extender: Tito Dine in Treatment: 4 Encounter Discharge Information Items Discharge Condition: Stable Ambulatory Status:  Ambulatory Discharge Destination: Home Transportation: Private Auto Accompanied By: self Schedule Follow-up Appointment: Yes Clinical Summary of Care: Electronic Signature(s) Signed: 10/16/2020 4:35:13 PM By: Jeanine Luz Entered By: Jeanine Luz on 08/67/6195 09:32:67 [name], [name] ([id number]) -------------------------------------------------------------------------------- Lower Extremity Assessment Details Patient Name: [name] Date of Service: 10/16/2020 10:00 AM Medical Record Number: [id number] Patient Account Number: [id number] Date of Birth/Sex: 04/15/1951 (70 y.o. F) Treating RN: Dolan Amen Primary Care Kyler Germer: Miguel Aschoff Other Clinician: Referring Fremon Zacharia: Miguel Aschoff Treating Lemon Whitacre/Extender: Tito Dine in Treatment: 4 Edema Assessment Assessed: [Left: No] [Right: No] Edema: [Left: Ye] [Right: s] Calf Left: Right: Point of Measurement: 39 cm From Medial Instep 35 cm Ankle Left: Right: Point of Measurement: 10 cm From Medial Instep 21 cm Vascular Assessment Pulses: Dorsalis Pedis Palpable: [Left:Yes] Electronic Signature(s) Signed: 10/16/2020 4:35:13 PM By: Jeanine Luz Signed: 10/16/2020 5:05:31 PM By: Georges Mouse, Minus Breeding RN Entered By: Jeanine Luz on 19/37/9024 09:73:53 [name], [name] ([id number]) -------------------------------------------------------------------------------- Multi Wound Chart Details Patient Name: [name] Date of Service: 10/16/2020 10:00 AM Medical Record Number: [id number] Patient Account Number: [id number] Date of Birth/Sex: February 25, 1951 (70 y.o. F) Treating RN: Dolan Amen Primary Care Kinlie Janice: Miguel Aschoff Other Clinician: Referring Luian Schumpert: Miguel Aschoff Treating Towana Stenglein/Extender: Tito Dine in Treatment: 4 Vital Signs Height(in): Pulse(bpm): 23 Weight(lbs): Blood Pressure(mmHg): 120/73 Body Mass  Index(BMI): Temperature(F): 98.2 Respiratory Rate(breaths/min): 20 Photos: [N/A:N/A] Wound Location: Left, Medial Lower Leg N/A N/A Wounding Event: Surgical Injury N/A N/A Primary Etiology: Dehisced Wound N/A N/A Comorbid History: Cataracts, Gout N/A N/A Date Acquired: 05/27/2020 N/A N/A Weeks of Treatment: 4 N/A N/A Wound Status: Open N/A N/A Measurements L x W x D (cm) 1x0.4x0.2 N/A N/A Area (cm) : 0.314 N/A N/A Volume (cm) : 0.063 N/A N/A % Reduction in Area: 95.40% N/A N/A % Reduction in Volume: 95.40% N/A N/A Classification: Full Thickness Without Exposed N/A N/A Support Structures Exudate Amount: Medium N/A N/A Exudate Type: Serosanguineous N/A N/A Exudate Color: red, brown N/A N/A Granulation Amount: Medium (34-66%) N/A N/A Granulation Quality: Red, Hyper-granulation N/A N/A Necrotic Amount: Medium (34-66%) N/A N/A Exposed Structures: Fat Layer (Subcutaneous Tissue): N/A N/A Yes Fascia: No Tendon: No Muscle: No Joint: No Bone: No Epithelialization: Medium (34-66%) N/A N/A Procedures Performed: Compression Therapy N/A N/A Treatment Notes Electronic Signature(s) Signed: 10/16/2020 5:07:17 PM By:  Linton Ham MD Entered By: Linton Ham on 49/67/5916 38:46:65 [name], [name] ([id number]) -------------------------------------------------------------------------------- Multi-Disciplinary Care Plan Details Patient Name: [name] Date of Service: 10/16/2020 10:00 AM Medical Record Number: [id number] Patient Account Number: [id number] Date of Birth/Sex: 1951-02-12 (70 y.o. F) Treating RN: Dolan Amen Primary Care Kennie Snedden: Miguel Aschoff Other Clinician: Referring Amberlee Garvey: Miguel Aschoff Treating Gracious Renken/Extender: Tito Dine in Treatment: 4 Active Inactive Wound/Skin Impairment Nursing Diagnoses: Impaired tissue integrity Goals: Patient/caregiver will verbalize understanding of skin care regimen Date Initiated:  09/18/2020 Date Inactivated: 09/25/2020 Target Resolution Date: 09/18/2020 Goal Status: Met Ulcer/skin breakdown will have a volume reduction of 30% by week 4 Date Initiated: 09/18/2020 Target Resolution Date: 10/18/2020 Goal Status: Active Ulcer/skin breakdown will have a volume reduction of 50% by week 8 Date Initiated: 09/18/2020 Target Resolution Date: 11/18/2020 Goal Status: Active Ulcer/skin breakdown will have a volume reduction of 80% by week 12 Date Initiated: 09/18/2020 Target Resolution Date: 12/18/2020 Goal Status: Active Ulcer/skin breakdown will heal within 14 weeks Date Initiated: 09/18/2020 Target Resolution Date: 01/18/2021 Goal Status: Active Interventions: Assess patient/caregiver ability to obtain necessary supplies Assess patient/caregiver ability to perform ulcer/skin care regimen upon admission and as needed Assess ulceration(s) every visit Provide education on ulcer and skin care Treatment Activities: Referred to DME Caprina Wussow for dressing supplies : 09/18/2020 Skin care regimen initiated : 09/18/2020 Notes: Electronic Signature(s) Signed: 10/16/2020 5:05:31 PM By: Georges Mouse, Minus Breeding RN Entered By: Georges Mouse, Minus Breeding on 33/00/7622 63:33:54 [name], [name] ([id number]) -------------------------------------------------------------------------------- Pain Assessment Details Patient Name: [name] Date of Service: 10/16/2020 10:00 AM Medical Record Number: [id number] Patient Account Number: [id number] Date of Birth/Sex: [birth year] (70 y.o. F) Treating RN: Dolan Amen Primary Care Freedom Lopezperez: Miguel Aschoff Other Clinician: Referring Annarae Macnair: Miguel Aschoff Treating Berkeley Veldman/Extender: Tito Dine in Treatment: 4 Active Problems Location of Pain Severity and Description of Pain Patient Has Paino No Site Locations Pain Management and Medication Current Pain Management: Electronic Signature(s) Signed: 10/16/2020 4:35:13 PM  By: Jeanine Luz Signed: 10/16/2020 5:05:31 PM By: Georges Mouse, Minus Breeding RN Entered By: Jeanine Luz on 15/72/6203 55:97:41 [name], [name] ([id number]) -------------------------------------------------------------------------------- Patient/Caregiver Education Details Patient Name: [name] Date of Service: 10/16/2020 10:00 AM Medical Record Number: [id number] Patient Account Number: [id number] Date of Birth/Gender: Jul 27, 1950 (70 y.o. F) Treating RN: Dolan Amen Primary Care Physician: Miguel Aschoff Other Clinician: Referring Physician: Miguel Aschoff Treating Physician/Extender: Tito Dine in Treatment: 4 Education Assessment Education Provided To: Patient Education Topics Provided Wound/Skin Impairment: Methods: Explain/Verbal Responses: State content correctly Electronic Signature(s) Signed: 10/16/2020 5:05:31 PM By: Georges Mouse, Minus Breeding RN Entered By: Georges Mouse, Minus Breeding on 25/00/3704 88:89:16 [name], [name] ([id number]) -------------------------------------------------------------------------------- Wound Assessment Details Patient Name: [name] Date of Service: 10/16/2020 10:00 AM Medical Record Number: [id number] Patient Account Number: [id number] Date of Birth/Sex: 1950-11-20 (70 y.o. F) Treating RN: Dolan Amen Primary Care Kaisei Gilbo: Miguel Aschoff Other Clinician: Referring Hutchinson Isenberg: Miguel Aschoff Treating Ilse Billman/Extender: Tito Dine in Treatment: 4 Wound Status Wound Number: 1 Primary Etiology: Dehisced Wound Wound Location: Left, Medial Lower Leg Wound Status: Open Wounding Event: Surgical Injury Comorbid History: Cataracts, Gout Date Acquired: 05/27/2020 Weeks Of Treatment: 4 Clustered Wound: No Photos Wound Measurements Length: (cm) 1 Width: (cm) 0.4 Depth: (cm) 0.2 Area: (cm) 0.314 Volume: (cm) 0.063 % Reduction in Area: 95.4% % Reduction in Volume:  95.4% Epithelialization: Medium (34-66%) Tunneling: No Undermining: No Wound Description Classification: Full Thickness Without Exposed Support Structu Exudate Amount: Medium Exudate Type: Serosanguineous Exudate Color: red, brown res Foul Odor After Cleansing:  No Slough/Fibrino Yes Wound Bed Granulation Amount: Medium (34-66%) Exposed Structure Granulation Quality: Red, Hyper-granulation Fascia Exposed: No Necrotic Amount: Medium (34-66%) Fat Layer (Subcutaneous Tissue) Exposed: Yes Necrotic Quality: Adherent Slough Tendon Exposed: No Muscle Exposed: No Joint Exposed: No Bone Exposed: No Treatment Notes Wound #1 (Lower Leg) Wound Laterality: Left, Medial Cleanser Soap and Water Discharge Instruction: Gently cleanse wound with antibacterial soap, rinse and pat dry prior to dressing wounds Peri-Wound Care Desitin Maximum Strength Ointment 4 (oz) [name], [name] ([id number]) Discharge Instruction: Apply around wound Topical Primary Dressing Prisma 4.34 (in) Discharge Instruction: Moisten w/normal saline or sterile water; Cover wound as directed. Do not remove from wound bed. Secondary Dressing ABD Pad 5x9 (in/in) Discharge Instruction: Cover with ABD pad Secured With Compression Wrap Profore Lite LF 3 Multilayer Compression Bandaging System Discharge Instruction: Apply 3 multi-layer wrap as prescribed. Compression Stockings Add-Ons Electronic Signature(s) Signed: 10/16/2020 4:35:13 PM By: Jeanine Luz Signed: 10/16/2020 5:05:31 PM By: Georges Mouse, Minus Breeding RN Entered By: Jeanine Luz on 68/01/8109 31:59:45 [name], [name] ([id number]) -------------------------------------------------------------------------------- Vitals Details Patient Name: [name] Date of Service: 10/16/2020 10:00 AM Medical Record Number: [id number] Patient Account Number: [id number] Date of Birth/Sex: 03/08/1951 (70 y.o. F) Treating RN: Dolan Amen Primary  Care Starlette Thurow: Miguel Aschoff Other Clinician: Referring Mickle Campton: Miguel Aschoff Treating Genese Quebedeaux/Extender: Tito Dine in Treatment: 4 Vital Signs Time Taken: 10:15 Temperature (F): 98.2 Pulse (bpm): 91 Respiratory Rate (breaths/min): 20 Blood Pressure (mmHg): 120/73 Reference Range: 80 - 120 mg / dl Electronic Signature(s) Signed: 10/16/2020 4:35:13 PM By: Jeanine Luz Entered By: Jeanine Luz on 10/16/2020 10:15:19

## 2020-10-23 ENCOUNTER — Encounter: Payer: Medicare Other | Attending: Physician Assistant | Admitting: Physician Assistant

## 2020-10-23 ENCOUNTER — Other Ambulatory Visit: Payer: Self-pay

## 2020-10-23 DIAGNOSIS — L03116 Cellulitis of left lower limb: Secondary | ICD-10-CM | POA: Diagnosis not present

## 2020-10-23 DIAGNOSIS — C44729 Squamous cell carcinoma of skin of left lower limb, including hip: Secondary | ICD-10-CM | POA: Insufficient documentation

## 2020-10-23 DIAGNOSIS — I872 Venous insufficiency (chronic) (peripheral): Secondary | ICD-10-CM | POA: Diagnosis not present

## 2020-10-23 DIAGNOSIS — T8131XA Disruption of external operation (surgical) wound, not elsewhere classified, initial encounter: Secondary | ICD-10-CM | POA: Diagnosis not present

## 2020-10-23 DIAGNOSIS — L97822 Non-pressure chronic ulcer of other part of left lower leg with fat layer exposed: Secondary | ICD-10-CM | POA: Diagnosis not present

## 2020-10-23 NOTE — Progress Notes (Addendum)
[name] ([id number]) Visit Report for 10/23/2020 Chief Complaint Document Details Patient Name: [name], [name] Date of Service: 10/23/2020 10:00 AM Medical Record Number: [id number] Patient Account Number: [id number] Date of Birth/Sex: 11-Nov-1950 (70 y.o. F) Treating RN: [name] Primary Care Provider: Miguel [name] Other Clinician: Jeanine [name] Referring Provider: Miguel [name] Treating Provider/Extender: [name] in Treatment: 5 Information Obtained from: Patient Chief Complaint Left LE Surgical Ulcer Electronic Signature(s) Signed: 10/23/2020 10:12:28 AM By: Worthy Keeler PA-C Entered By: Worthy [name] on 60/73/7106 26:94:85 [name], [name] ([id number]) -------------------------------------------------------------------------------- HPI Details Patient Name: [name] Date of Service: 10/23/2020 10:00 AM Medical Record Number: [id number] Patient Account Number: [id number] Date of Birth/Sex: [birth year] (70 y.o. F) Treating RN: [name] Primary Care Provider: Miguel [name] Other Clinician: Jeanine [name] Referring Provider: Miguel [name] Treating Provider/Extender: [name] in Treatment: 5 History of Present Illness HPI Description: 09/18/2020 upon evaluation today patient presents for initial inspection here in clinic concerning issues that she has been having unfortunately with a wound on [name] which has been present actually since December 7 when she had a squamous cell carcinoma removed. Subsequently I did review the pictures that she had on [name] of the carcinoma site post excision and it was quite significant. With that being said the tension that was needed to pull the edges of the wound together to approximate apparently caused some issues as far as appropriate healing was concerned and this became necrotic and unfortunately dehisced. Subsequently the patient has since  been undergoing treatment at the [facility] where to be honest they have been attempting as much as possible to manage both infections as well as the overall swelling of the leg. They have been placing the patient in Unna boots for the past 3 weeks. Subsequently she also did have a culture which was on 08/27/2020 that showed Enterococcus and then she was on doxycycline at that point. Subsequently she was switched to amoxicillin based on the culture on 09/02/2020 she is done with that at this point. With that being said the patient is very frustrated with how long this has been going on she notes that today during our conversation. This was a squamous cell carcinoma that led to this need for surgery. 09/25/2020 upon evaluation today patient appears to be doing well with regard to [name]. She is tolerating the dressing changes without complication fortunately there does not appear to be any signs of infection which is great news. No fevers, chills, nausea, vomiting, or diarrhea. She is going to require little bit of debridement today in regard to an area that is somewhat appearing to be a hematoma in the upper portion of the wound this is very tiny but nonetheless does seem to have some fluid collecting in this area. 10/02/2020 upon evaluation today patient appears to be doing well currently with regard to [name]. This is showing signs of excellent improvement which is great news and overall very pleased with where things stand today. 10/09/2020 upon evaluation today patient appears to be doing well with regard to [name]. This is measuring smaller yet again. With that being said she does have a little bit of an opening where last week we found a small hematoma unfortunately that is still taking his time as far as healing is concerned. The Hydrofera Blue is done excellent I feel like we may want to switch over to collagen at this point with some of the deeper  areas noted. 4/28;  the patient is left with a small open area although it is left to the original wound. This may have been the area that they found a small hematoma recently in this clinic. Been using silver collagen. There is no other superficial areas all of this is epithelialized 10/23/2020 upon evaluation today patient appears to be doing well with regard to [name]. She is making excellent progress and in fact there is just a very tiny area that still open at this point where we had found the small blood clot. This is significantly improved and I am very pleased with where we stand currently. There does not appear to be any signs of infection which is great news as well. No fevers, chills, nausea, vomiting, or diarrhea. Electronic Signature(s) Signed: 10/23/2020 10:58:46 AM By: Worthy Keeler PA-C Entered By: Worthy [name] on 64/40/3474 25:95:63 [name], [name] ([id number]) -------------------------------------------------------------------------------- Physical Exam Details Patient Name: [name] Date of Service: 10/23/2020 10:00 AM Medical Record Number: [id number] Patient Account Number: [id number] Date of Birth/Sex: 1950-12-08 (70 y.o. F) Treating RN: [name] Primary Care Provider: Miguel [name] Other Clinician: Jeanine [name] Referring Provider: Miguel [name] Treating Provider/Extender: Jeri Cos Weeks in Treatment: 5 Constitutional Well-nourished and well-hydrated in no acute distress. Respiratory normal breathing without difficulty. Psychiatric this patient is able to make decisions and demonstrates good insight into disease process. Alert and Oriented x 3. pleasant and cooperative. Notes Patient's wound actually showed signs of good granulation epithelization at this point. There does not appear to be any signs of active infection at this time which is great news and overall I am extremely pleased with where things stand. I think that the biggest issue we  have she is getting ready to leave to go on a trip next Thursday. We may need to have a plan for what to do for [name] when she is gone as far as having to take care of [name] and controlling the edema is concerned. The patient was appreciative of this for thought. Electronic Signature(s) Signed: 10/23/2020 1:18:47 PM By: Worthy Keeler PA-C Entered By: Worthy [name] on 63/06/6008 93:23:55 [name], [name] ([id number]) -------------------------------------------------------------------------------- Physician Orders Details Patient Name: [name] Date of Service: 10/23/2020 10:00 AM Medical Record Number: [id number] Patient Account Number: [id number] Date of Birth/Sex: [birth year] (70 y.o. 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 Bathing/ Shower/ Hygiene o May shower with wound dressing protected with water repellent cover or cast protector. Edema Control - Lymphedema / Segmental Compressive Device / Other o Elevate legs to the level of the heart and pump ankles as often as possible o Elevate leg(s) parallel to the floor when sitting. Wound Treatment Wound #1 - Lower Leg Wound Laterality: Left, Medial Cleanser: Soap and Water 1 x Per Week/15 Days Discharge Instructions: Gently cleanse wound with antibacterial soap, rinse and pat dry prior to dressing wounds Primary  Dressing: Prisma 4.34 (in) 1 x Per Week/15 Days Discharge Instructions: Moisten w/normal saline or sterile water; Cover wound as directed.  Do not remove from wound bed. Secondary Dressing: ABD Pad 5x9 (in/in) 1 x Per Week/15 Days Discharge Instructions: Cover with ABD pad Compression Wrap: Profore Lite LF 3 Multilayer Compression Bandaging System 1 x Per Week/15 Days Discharge Instructions: Apply 3 multi-layer wrap as prescribed. Compression Stockings: Circaid Juxta Lite Compression Wrap (DME) Left Leg Compression Amount: 30-40 mmHG Discharge Instructions: Apply Circaid Juxta Lite Compression Wrap as directed Electronic Signature(s) Signed: 10/23/2020 1:30:25 PM By: Worthy Keeler PA-C Signed: 10/23/2020 5:11:22 PM By: Georges Mouse, Minus Breeding RN Entered By: Georges Mouse, Minus Breeding on 48/18/5631 49:70:26 [name], [name] ([id number]) -------------------------------------------------------------------------------- Problem List Details Patient Name: [name] Date of Service: 10/23/2020 10:00 AM Medical Record Number: [id number] Patient Account Number: [id number] Date of Birth/Sex: Oct 12, 1950 (70 y.o. 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 F) Treating RN: [name] Primary Care Provider: Miguel [name] Other Clinician: Jeanine [name] Referring Provider: Miguel [name] Treating Provider/Extender: [name] in Treatment: 5 Subjective Chief Complaint Information obtained from Patient Left LE Surgical Ulcer History of Present Illness (HPI) 09/18/2020 upon evaluation today patient presents for initial inspection here in clinic concerning issues that she has been having unfortunately with a wound on [name] which has been present actually since December 7 when she had a squamous cell carcinoma removed. Subsequently I did review the pictures that she had on [name] of the carcinoma site post excision and it was quite significant. With that being said the tension that was needed to pull the edges of the wound together to approximate apparently caused some issues as far as appropriate healing was concerned and this became necrotic and unfortunately dehisced. Subsequently the patient has since been undergoing treatment at the [facility] where to be honest they have been attempting as much as possible to manage both infections as well as the overall swelling of the leg. They have been placing the patient in Unna boots for the past 3 weeks. Subsequently she also did have a culture which was on 08/27/2020 that showed Enterococcus and then she was on doxycycline at that point. Subsequently she was switched to amoxicillin based on the culture on 09/02/2020 she is done with that at this point. With that being said the patient is very frustrated with  how long this has been going on she notes that today during our conversation. This was a squamous cell carcinoma that led to this need for surgery. 09/25/2020 upon evaluation today patient appears to be doing well with regard to [name]. She is tolerating the dressing changes without complication fortunately there does not appear to be any signs of infection which is great news. No fevers, chills, nausea, vomiting,  or diarrhea. She is going to require little bit of debridement today in regard to an area that is somewhat appearing to be a hematoma in the upper portion of the wound this is very tiny but nonetheless does seem to have some fluid collecting in this area. 10/02/2020 upon evaluation today patient appears to be doing well currently with regard to [name]. This is showing signs of excellent improvement which is great news and overall very pleased with where things stand today. 10/09/2020 upon evaluation today patient appears to be doing well with regard to [name]. This is measuring smaller yet again. With that being said she does have a little bit of an opening where last week we found a small hematoma unfortunately that is still taking his time as far as healing is concerned. The Hydrofera Blue is done excellent I feel like we may want to switch over to collagen at this point with some of the deeper areas noted. 4/28; the patient is left with a small open area although it is left to the original wound. This may have been the area that they found a small hematoma recently in this clinic. Been using silver collagen. There is no other superficial areas all of this is epithelialized 10/23/2020 upon evaluation today patient appears to be doing well with regard to [name]. She is making excellent progress and in fact there is just a very tiny area that still open at this point where we had found the small blood clot. This is significantly improved and I am very pleased with where we  stand currently. There does not appear to be any signs of infection which is great news as well. No fevers, chills, nausea, vomiting, or diarrhea. Objective Constitutional Well-nourished and well-hydrated in no acute distress. Vitals Time Taken: 10:20 AM, Temperature: 98.4 F, Pulse: 98 bpm, Respiratory Rate: 16 breaths/min, Blood Pressure: 110/73 mmHg. Respiratory normal breathing without difficulty. Psychiatric this patient is able to make decisions and demonstrates good insight into disease process. Alert and Oriented x 3. pleasant and cooperative. General Notes: Patient's wound actually showed signs of good granulation epithelization at this point. There does not appear to be any signs of active infection at this time which is great news and overall I am extremely pleased with where things stand. I think that the biggest issue we have she is getting ready to leave to go on a trip next Thursday. We may need to have a plan for what to do for [name] when she is gone as far as having to take care of [name] and controlling the edema is concerned. The patient was appreciative of this for thought. [name], [name] ([id number]) Integumentary (Hair, Skin) Wound #1 status is Open. Original cause of wound was Surgical Injury. The date acquired was: 05/27/2020. The wound has been in treatment 5 weeks. The wound is located on the Left,Medial Lower Leg. The wound measures 0.1cm length x 0.1cm width x 0.1cm depth; 0.008cm^2 area and 0.001cm^3 volume. There is Fat Layer (Subcutaneous Tissue) exposed. There is no tunneling or undermining noted. There is a medium amount of serosanguineous drainage noted. There is medium (34-66%) red granulation within the wound bed. There is no necrotic tissue within the wound bed. Assessment Active Problems ICD-10 Disruption of external operation (surgical) wound, not elsewhere classified, initial encounter Non-pressure chronic ulcer of other part of left lower leg  with fat layer exposed Cellulitis of left lower limb Squamous cell carcinoma of skin of left  lower limb, including hip Procedures Wound #1 Pre-procedure diagnosis of Wound #1 is a Dehisced Wound located on the Left,Medial Lower Leg . There was a Three Layer Compression Therapy Procedure with a pre-treatment ABI of 1.3 by [name] Amen, RN. Post procedure Diagnosis Wound #1: Same as Pre-Procedure Plan Follow-up Appointments: Return Appointment in 1 week. Bathing/ Shower/ Hygiene: May shower with wound dressing protected with water repellent cover or cast protector. Edema Control - Lymphedema / Segmental Compressive Device / Other: Elevate legs to the level of the heart and pump ankles as often as possible Elevate leg(s) parallel to the floor when sitting. WOUND #1: - Lower Leg Wound Laterality: Left, Medial Cleanser: Soap and Water 1 x Per Week/15 Days Discharge Instructions: Gently cleanse wound with antibacterial soap, rinse and pat dry prior to dressing wounds Primary Dressing: Prisma 4.34 (in) 1 x Per Week/15 Days Discharge Instructions: Moisten w/normal saline or sterile water; Cover wound as directed. Do not remove from wound bed. Secondary Dressing: ABD Pad 5x9 (in/in) 1 x Per Week/15 Days Discharge Instructions: Cover with ABD pad Compression Wrap: Profore Lite LF 3 Multilayer Compression Bandaging System 1 x Per Week/15 Days Discharge Instructions: Apply 3 multi-layer wrap as prescribed. Compression Stockings: Circaid Juxta Lite Compression Wrap (DME) Compression Amount: 30-40 mmHg (left) Discharge Instructions: Apply Circaid Juxta Lite Compression Wrap as directed 1. Would recommend currently that we go ahead and continue with the wound care measures as before and the patient is in agreement with the plan specifically with regard to the silver collagen which I think is doing a great job. 2. At this point we will continue with ABD pad to cover followed by 3 layer compression  wrap. 3. I am going to order a juxta light compression for [name] in order to have something going forward over the next week when she goes out of town. Should be able to apply the dressing as well as put this over top I think that would be absolutely appropriate. We will see patient back for reevaluation in 1 week here in the clinic. If anything worsens or changes patient will contact our office for additional recommendations. Electronic Signature(s) [name], [name] ([id number]) Signed: 10/23/2020 1:21:07 PM By: Worthy Keeler PA-C Entered By: Worthy [name] on 81/06/7508 25:85:27 [name], [name] Schwartz ([id number]) -------------------------------------------------------------------------------- SuperBill Details Patient Name: [name] Date of Service: 10/23/2020 Medical Record Number: [id number] Patient Account Number: [id number] Date of Birth/Sex: 1951/04/19 (70 y.o. 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, including hip Facility Procedures CPT4 Code: 86761950 Description: (Facility Use Only) [phone] - APPLY MULTLAY COMPRS LWR LT LEG Modifier: Quantity: 1 Physician Procedures CPT4 Code: 4580998 Description: 99213 - WC PHYS LEVEL 3 - EST PT Modifier: Quantity: 1 CPT4 Code: Description: ICD-10 Diagnosis Description T81.31XA Disruption of external operation (surgical) wound, not elsewhere classifi P38.250 Non-pressure chronic ulcer of other part of left lower leg with fat  layer L03.116 Cellulitis of left lower limb C44.729  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

## 2020-10-23 NOTE — Progress Notes (Addendum)
BECKI, MCCASKILL ([id number]) Visit Report for 10/23/2020 Arrival Information Details Patient Name: [name], [name] Date of Service: 10/23/2020 10:00 AM Medical Record Number: [id number] Patient Account Number: [id number] Date of Birth/Sex: Aug 05, 1950 (69 y.o. F) Treating RN: Carlene Coria Primary Care Laurene Melendrez: Miguel Aschoff Other Clinician: Jeanine Luz Referring Zerek Litsey: Miguel Aschoff Treating Spero Gunnels/Extender: Skipper Cliche in Treatment: 5 Visit Information History Since Last Visit All ordered tests and consults were completed: No Patient Arrived: Ambulatory Added or deleted any medications: No Arrival Time: 10:19 Any new allergies or adverse reactions: No Accompanied By: self Had a fall or experienced change in No Transfer Assistance: None activities of daily living that may affect Patient Identification Verified: Yes risk of falls: Secondary Verification Process Completed: Yes Signs or symptoms of abuse/neglect since last visito No Patient Requires Transmission-Based Precautions: No Hospitalized since last visit: No Patient Has Alerts: No Implantable device outside of the clinic excluding No cellular tissue based products placed in the center since last visit: Has Dressing in Place as Prescribed: Yes Has Compression in Place as Prescribed: Yes Pain Present Now: No Electronic Signature(s) Signed: 11/03/2020 8:33:59 AM By: Carlene Coria RN Entered By: Carlene Coria on 62/83/1517 61:60:73 Kapfer, [name] ([id number]) -------------------------------------------------------------------------------- Clinic Level of Care Assessment Details Patient Name: [name] Date of Service: 10/23/2020 10:00 AM Medical Record Number: [id number] Patient Account Number: [id number] Date of Birth/Sex: Feb 14, 1951 (69 y.o. F) Treating RN: Dolan Amen Primary Care Liona Wengert: Miguel Aschoff Other Clinician: Jeanine Luz Referring Shigeru Lampert: Miguel Aschoff Treating Saya Mccoll/Extender: Skipper Cliche in Treatment: 5 Clinic Level of Care Assessment Items TOOL 1 Quantity Score _0  - Use when EandM and Procedure is performed on INITIAL visit 0 ASSESSMENTS - Nursing Assessment / Reassessment _1  - General Physical Exam (combine w/ comprehensive assessment (listed just below) when performed on new 0 pt. evals) _2  - 0 Comprehensive Assessment (HX, ROS, Risk Assessments, Wounds Hx, etc.) ASSESSMENTS - Wound and Skin Assessment / Reassessment _3  - Dermatologic / Skin Assessment (not related to wound area) 0 ASSESSMENTS - Ostomy and/or Continence Assessment and Care _4  - Incontinence Assessment and Management 0 _5  - 0 Ostomy Care Assessment and Management (repouching, etc.) PROCESS - Coordination of Care _6  - Simple Patient / Family Education for ongoing care 0 _7  - 0 Complex (extensive) Patient / Family Education for ongoing care _8  - 0 Staff obtains Programmer, systems, Records, Test Results / Process Orders _9  - 0 Staff telephones HHA, Nursing Homes / Clarify orders / etc _10  - 0 Routine Transfer to another Facility (non-emergent condition) _11  - 0 Routine Hospital Admission (non-emergent condition) _12  - 0 New Admissions / Biomedical engineer / Ordering NPWT, Apligraf, etc. _13  - 0 Emergency Hospital Admission (emergent condition) PROCESS - Special Needs _14  - Pediatric / Minor Patient Management 0 _15  - 0 Isolation Patient Management _16  - 0 Hearing / Language / Visual special needs _17  - 0 Assessment of Community assistance (transportation, D/C planning, etc.) _18  - 0 Additional assistance / Altered mentation _19  - 0 Support Surface(s) Assessment (bed, cushion, seat, etc.) INTERVENTIONS - Miscellaneous _20  - External ear exam 0 _21  - 0 Patient Transfer (multiple staff / Civil Service fast streamer / Similar devices) _22  - 0 Simple Staple / Suture removal (25 or less) _23  - 0 Complex Staple / Suture removal (26 or more) _24  - 0 Hypo/Hyperglycemic  Management (do not check if billed separately) _25  - 0 Ankle / Brachial Index (ABI) - do not check if billed separately Has the patient been seen at the hospital within the last  three years: Yes Total Score: 0 Level Of Care: ____ [name] ([id number]) Electronic Signature(s) Signed: 10/23/2020 5:11:22 PM By: Georges Mouse, Minus Breeding RN Entered By: Georges Mouse, Minus Breeding on 75/17/0017 49:44:96 Wint, [name] ([id number]) -------------------------------------------------------------------------------- Compression Therapy Details Patient Name: [name] Date of Service: 10/23/2020 10:00 AM Medical Record Number: [id number] Patient Account Number: [id number] Date of Birth/Sex: [birth year] (69 y.o. F) Treating RN: Dolan Amen Primary Care Kittie Krizan: Miguel Aschoff Other Clinician: Jeanine Luz Referring Marcelyn Ruppe: Miguel Aschoff Treating Laynee Lockamy/Extender: Jeri Cos Weeks in Treatment: 5 Compression Therapy Performed for Wound Assessment: Wound #1 Left,Medial Lower Leg Performed By: Clinician Dolan Amen, RN Compression Type: Three Layer Pre Treatment ABI: 1.3 Post Procedure Diagnosis Same as Pre-procedure Electronic Signature(s) Signed: 10/23/2020 5:11:22 PM By: Georges Mouse, Minus Breeding RN Entered By: Georges Mouse, Minus Breeding on 77/93/9030 09:23:30 Nagy, [name] ([id number]) -------------------------------------------------------------------------------- Encounter Discharge Information Details Patient Name: [name] Date of Service: 10/23/2020 10:00 AM Medical Record Number: [id number] Patient Account Number: [id number] Date of Birth/Sex: 01-08-1951 (69 y.o. F) Treating RN: Dolan Amen Primary Care Sevanna Ballengee: Miguel Aschoff Other Clinician: Jeanine Luz Referring Temara Lanum: Miguel Aschoff Treating Caine Barfield/Extender: Skipper Cliche in Treatment: 5 Encounter Discharge Information Items Discharge Condition:  Stable Ambulatory Status: Ambulatory Discharge Destination: Home Transportation: Private Auto Accompanied By: self Schedule Follow-up Appointment: Yes Clinical Summary of Care: Electronic Signature(s) Signed: 10/23/2020 4:47:27 PM By: Jeanine Luz Entered By: Jeanine Luz on 93/73/4287 68:11:57 Waibel, Ethell ([id number]) -------------------------------------------------------------------------------- Lower Extremity Assessment Details Patient Name: [name] Date of Service: 10/23/2020 10:00 AM Medical Record Number: [id number] Patient Account Number: [id number] Date of Birth/Sex: Nov 12, 1950 (69 y.o. F) Treating RN: Carlene Coria Primary Care Bella Brummet: Miguel Aschoff Other Clinician: Jeanine Luz Referring Mostyn Varnell: Miguel Aschoff Treating Dyrell Tuccillo/Extender: Skipper Cliche in Treatment: 5 Edema Assessment Assessed: [Left: No] [Right: No] [Left: Edema] [Right: :] Calf Left: Right: Point of Measurement: 39 cm From Medial Instep 36 cm Ankle Left: Right: Point of Measurement: 10 cm From Medial Instep 21 cm Knee To Floor Left: Right: From Medial Instep 37 cm Vascular Assessment Pulses: Dorsalis Pedis Palpable: [Left:Yes] Electronic Signature(s) Signed: 10/23/2020 5:11:22 PM By: Georges Mouse, Minus Breeding RN Signed: 11/03/2020 8:33:59 AM By: Carlene Coria RN Entered By: Georges Mouse, Minus Breeding on 46/80/3212 24:82:50 Frakes, Maebry ([id number]) -------------------------------------------------------------------------------- Multi Wound Chart Details Patient Name: [name] Date of Service: 10/23/2020 10:00 AM Medical Record Number: [id number] Patient Account Number: [id number] Date of Birth/Sex: [birth year] (69 y.o. F) Treating RN: Dolan Amen Primary Care Yamato Kopf: Miguel Aschoff Other Clinician: Jeanine Luz Referring Octavion Mollenkopf: Miguel Aschoff Treating Loyal Holzheimer/Extender: Skipper Cliche in Treatment: 5 Vital  Signs Height(in): Pulse(bpm): 19 Weight(lbs): Blood Pressure(mmHg): 110/73 Body Mass Index(BMI): Temperature(F): 98.4 Respiratory Rate(breaths/min): 16 Photos: [N/A:N/A] Wound Location: Left, Medial Lower Leg N/A N/A Wounding Event: Surgical Injury N/A N/A Primary Etiology: Dehisced Wound N/A N/A Comorbid History: Cataracts, Gout N/A N/A Date Acquired: 05/27/2020 N/A N/A Weeks of Treatment: 5 N/A N/A Wound Status: Open N/A N/A Measurements L x W x D (cm) 0.1x0.1x0.1 N/A N/A Area (cm) : 0.008 N/A N/A Volume (cm) : 0.001 N/A N/A % Reduction in Area: 99.90% N/A N/A % Reduction in Volume: 99.90% N/A N/A Classification: Full Thickness Without Exposed N/A N/A Support Structures Exudate Amount: Medium N/A N/A Exudate Type: Serosanguineous N/A N/A Exudate Color: red, brown N/A N/A Granulation Amount: Medium (34-66%) N/A N/A Granulation Quality: Red N/A N/A Necrotic Amount: None Present (0%) N/A N/A Exposed Structures: Fat Layer (Subcutaneous Tissue): N/A N/A Yes Fascia: No Tendon: No Muscle: No Joint: No Bone: No Epithelialization: Large (  67-100%) N/A N/A Treatment Notes Electronic Signature(s) Signed: 10/23/2020 5:11:22 PM By: Georges Mouse, Minus Breeding RN Entered By: Georges Mouse, Minus Breeding on 29/93/7169 67:89:38 Mcnair, [name] ([id number]) -------------------------------------------------------------------------------- [place] Details Patient Name: [name] Date of Service: 10/23/2020 10:00 AM Medical Record Number: [id number] Patient Account Number: [id number] Date of Birth/Sex: [birth year] (69 y.o. F) Treating RN: Dolan Amen Primary Care Mary-Ann Pennella: Miguel Aschoff Other Clinician: Jeanine Luz Referring Vaniya Augspurger: Miguel Aschoff Treating Shayaan Parke/Extender: Skipper Cliche in Treatment: 5 Active Inactive Wound/Skin Impairment Nursing Diagnoses: Impaired tissue integrity Goals: Patient/caregiver will verbalize  understanding of skin care regimen Date Initiated: 09/18/2020 Date Inactivated: 09/25/2020 Target Resolution Date: 09/18/2020 Goal Status: Met Ulcer/skin breakdown will have a volume reduction of 30% by week 4 Date Initiated: 09/18/2020 Date Inactivated: 10/23/2020 Target Resolution Date: 10/18/2020 Goal Status: Met Ulcer/skin breakdown will have a volume reduction of 50% by week 8 Date Initiated: 09/18/2020 Date Inactivated: 10/23/2020 Target Resolution Date: 11/18/2020 Goal Status: Met Ulcer/skin breakdown will have a volume reduction of 80% by week 12 Date Initiated: 09/18/2020 Date Inactivated: 10/23/2020 Target Resolution Date: 12/18/2020 Goal Status: Met Ulcer/skin breakdown will heal within 14 weeks Date Initiated: 09/18/2020 Target Resolution Date: 01/18/2021 Goal Status: Active Interventions: Assess patient/caregiver ability to obtain necessary supplies Assess patient/caregiver ability to perform ulcer/skin care regimen upon admission and as needed Assess ulceration(s) every visit Provide education on ulcer and skin care Treatment Activities: Referred to DME Lizann Edelman for dressing supplies : 09/18/2020 Skin care regimen initiated : 09/18/2020 Notes: Electronic Signature(s) Signed: 10/23/2020 5:11:22 PM By: Georges Mouse, Minus Breeding RN Entered By: Georges Mouse, Minus Breeding on 35/36/1443 15:40:08 Schwanke, Zimal ([id number]) -------------------------------------------------------------------------------- Pain Assessment Details Patient Name: [name] Date of Service: 10/23/2020 10:00 AM Medical Record Number: [id number] Patient Account Number: [id number] Date of Birth/Sex: May 18, 1951 (69 y.o. F) Treating RN: Carlene Coria Primary Care Xzavior Reinig: Miguel Aschoff Other Clinician: Jeanine Luz Referring Jadis Mika: Miguel Aschoff Treating Marene Gilliam/Extender: Skipper Cliche in Treatment: 5 Active Problems Location of Pain Severity and Description of Pain Patient Has Paino  No Site Locations Pain Management and Medication Current Pain Management: Electronic Signature(s) Signed: 11/03/2020 8:33:59 AM By: Carlene Coria RN Entered By: Carlene Coria on 99/83/3825 05:39:76 Shedden, [name] ([id number]) -------------------------------------------------------------------------------- Patient/Caregiver Education Details Patient Name: [name] Date of Service: 10/23/2020 10:00 AM Medical Record Number: [id number] Patient Account Number: [id number] Date of Birth/Gender: 07-09-1950 (69 y.o. F) Treating RN: Dolan Amen Primary Care Physician: Miguel Aschoff Other Clinician: Jeanine Luz Referring Physician: Miguel Aschoff Treating Physician/Extender: Skipper Cliche in Treatment: 5 Education Assessment Education Provided To: Patient Education Topics Provided Wound/Skin Impairment: Methods: Explain/Verbal Responses: State content correctly Electronic Signature(s) Signed: 10/23/2020 5:11:22 PM By: Georges Mouse, Minus Breeding RN Entered By: Georges Mouse, Minus Breeding on 99/24/2683 41:96:22 Cockrum, [name] ([id number]) -------------------------------------------------------------------------------- Wound Assessment Details Patient Name: [name] Date of Service: 10/23/2020 10:00 AM Medical Record Number: [id number] Patient Account Number: [id number] Date of Birth/Sex: July 10, 1950 (69 y.o. F) Treating RN: Carlene Coria Primary Care Bryauna Byrum: Miguel Aschoff Other Clinician: Jeanine Luz Referring Yazmeen Woolf: Miguel Aschoff Treating Shanikka Wonders/Extender: Skipper Cliche in Treatment: 5 Wound Status Wound Number: 1 Primary Etiology: Dehisced Wound Wound Location: Left, Medial Lower Leg Wound Status: Open Wounding Event: Surgical Injury Comorbid History: Cataracts, Gout Date Acquired: 05/27/2020 Weeks Of Treatment: 5 Clustered Wound: No Photos Wound Measurements Length: (cm) 0.1 Width: (cm) 0.1 Depth: (cm) 0.1 Area:  (cm) 0.008 Volume: (cm) 0.001 % Reduction in Area: 99.9% % Reduction in Volume: 99.9% Epithelialization: Large (67-100%) Tunneling: No Undermining: No Wound Description Classification: Full Thickness  Without Exposed Support Structu Exudate Amount: Medium Exudate Type: Serosanguineous Exudate Color: red, brown res Foul Odor After Cleansing: No Slough/Fibrino No Wound Bed Granulation Amount: Medium (34-66%) Exposed Structure Granulation Quality: Red Fascia Exposed: No Necrotic Amount: None Present (0%) Fat Layer (Subcutaneous Tissue) Exposed: Yes Tendon Exposed: No Muscle Exposed: No Joint Exposed: No Bone Exposed: No Electronic Signature(s) Signed: 11/03/2020 8:33:59 AM By: Carlene Coria RN Entered By: Carlene Coria on 36/43/8377 93:96:88 Girvan, [name] ([id number]) -------------------------------------------------------------------------------- Vitals Details Patient Name: [name] Date of Service: 10/23/2020 10:00 AM Medical Record Number: [id number] Patient Account Number: [id number] Date of Birth/Sex: 1951/05/19 (69 y.o. F) Treating RN: Carlene Coria Primary Care Greenlee Ancheta: Miguel Aschoff Other Clinician: Jeanine Luz Referring Onesty Clair: Miguel Aschoff Treating Jamien Casanova/Extender: Skipper Cliche in Treatment: 5 Vital Signs Time Taken: 10:20 Temperature (F): 98.4 Pulse (bpm): 98 Respiratory Rate (breaths/min): 16 Blood Pressure (mmHg): 110/73 Reference Range: 80 - 120 mg / dl Electronic Signature(s) Signed: 11/03/2020 8:33:59 AM By: Carlene Coria RN Entered By: Carlene Coria on 10/23/2020 10:20:13

## 2020-10-28 ENCOUNTER — Telehealth: Payer: Self-pay

## 2020-10-28 DIAGNOSIS — R21 Rash and other nonspecific skin eruption: Secondary | ICD-10-CM

## 2020-10-28 MED ORDER — CLOBETASOL PROPIONATE 0.05 % EX FOAM: Freq: Two times a day (BID) | CUTANEOUS | 0 refills | Status: DC

## 2020-10-28 NOTE — Telephone Encounter (Signed)
Pt has scaly pink plaques at bilateral elbows, denies joint pain or stiffness. Exam consistent with psoriasis. Start clobetasol foam apply twice daily as needed to affected areas for two weeks then apply twice daily on weekends only   .avoidfga. Call if not doing well, worsening or if developing joint pain before follow-up appointment. Reviewed link with psoriatic arthritis and heart disease. thanks

## 2020-10-30 ENCOUNTER — Other Ambulatory Visit: Payer: Self-pay

## 2020-10-30 ENCOUNTER — Encounter: Payer: Medicare Other | Admitting: Physician Assistant

## 2020-10-30 DIAGNOSIS — L97822 Non-pressure chronic ulcer of other part of left lower leg with fat layer exposed: Secondary | ICD-10-CM | POA: Diagnosis not present

## 2020-10-30 DIAGNOSIS — C44729 Squamous cell carcinoma of skin of left lower limb, including hip: Secondary | ICD-10-CM | POA: Diagnosis not present

## 2020-10-30 DIAGNOSIS — L03116 Cellulitis of left lower limb: Secondary | ICD-10-CM | POA: Diagnosis not present

## 2020-10-30 DIAGNOSIS — T8131XA Disruption of external operation (surgical) wound, not elsewhere classified, initial encounter: Secondary | ICD-10-CM | POA: Diagnosis not present

## 2020-10-30 NOTE — Progress Notes (Addendum)
ALEXIUS, ELLINGTON ([id number]) Visit Report for 10/30/2020 Chief Complaint Document Details Patient Name: [name], [name] Date of Service: 10/30/2020 8:00 AM Medical Record Number: [id number] Patient Account Number: [id number] Date of Birth/Sex: Feb 21, 1951 (69 y.o. F) Treating RN: Dolan Amen Primary Care Provider: Miguel Aschoff Other Clinician: Jeanine Luz Referring Provider: Miguel Aschoff Treating Provider/Extender: Skipper Cliche in Treatment: 6 Information Obtained from: Patient Chief Complaint Left LE Surgical Ulcer Electronic Signature(s) Signed: 10/30/2020 8:24:50 AM By: Worthy Keeler PA-C Entered By: Worthy Keeler on 35/00/9381 82:99:37 [name], [name] ([id number]) -------------------------------------------------------------------------------- HPI Details Patient Name: [name] Date of Service: 10/30/2020 8:00 AM Medical Record Number: [id number] Patient Account Number: [id number] Date of Birth/Sex: [birth year] (69 y.o. F) Treating RN: Dolan Amen Primary Care Provider: Miguel Aschoff Other Clinician: Jeanine Luz Referring Provider: Miguel Aschoff Treating Provider/Extender: Skipper Cliche in Treatment: 6 History of Present Illness HPI Description: 09/18/2020 upon evaluation today patient presents for initial inspection here in clinic concerning issues that she has been having unfortunately with a wound on [name] which has been present actually since December 7 when she had a squamous cell carcinoma removed. Subsequently I did review the pictures that she had on [name] of the carcinoma site post excision and it was quite significant. With that being said the tension that was needed to pull the edges of the wound together to approximate apparently caused some issues as far as appropriate healing was concerned and this became necrotic and unfortunately dehisced. Subsequently the patient has since  been undergoing treatment at the [facility] where to be honest they have been attempting as much as possible to manage both infections as well as the overall swelling of the leg. They have been placing the patient in Unna boots for the past 3 weeks. Subsequently she also did have a culture which was on 08/27/2020 that showed Enterococcus and then she was on doxycycline at that point. Subsequently she was switched to amoxicillin based on the culture on 09/02/2020 she is done with that at this point. With that being said the patient is very frustrated with how long this has been going on she notes that today during our conversation. This was a squamous cell carcinoma that led to this need for surgery. 09/25/2020 upon evaluation today patient appears to be doing well with regard to [name]. She is tolerating the dressing changes without complication fortunately there does not appear to be any signs of infection which is great news. No fevers, chills, nausea, vomiting, or diarrhea. She is going to require little bit of debridement today in regard to an area that is somewhat appearing to be a hematoma in the upper portion of the wound this is very tiny but nonetheless does seem to have some fluid collecting in this area. 10/02/2020 upon evaluation today patient appears to be doing well currently with regard to [name]. This is showing signs of excellent improvement which is great news and overall very pleased with where things stand today. 10/09/2020 upon evaluation today patient appears to be doing well with regard to [name]. This is measuring smaller yet again. With that being said she does have a little bit of an opening where last week we found a small hematoma unfortunately that is still taking his time as far as healing is concerned. The Hydrofera Blue is done excellent I feel like we may want to switch over to collagen at this point with some of the deeper  areas noted. 4/28;  the patient is left with a small open area although it is left to the original wound. This may have been the area that they found a small hematoma recently in this clinic. Been using silver collagen. There is no other superficial areas all of this is epithelialized 10/23/2020 upon evaluation today patient appears to be doing well with regard to [name]. She is making excellent progress and in fact there is just a very tiny area that still open at this point where we had found the small blood clot. This is significantly improved and I am very pleased with where we stand currently. There does not appear to be any signs of infection which is great news as well. No fevers, chills, nausea, vomiting, or diarrhea. 10/30/2020 upon evaluation today patient appears to be doing excellent in regard to [name]. She has been tolerating the dressing changes without complication. Fortunately there is no sign of active infection at this time. No fevers, chills, nausea, vomiting, or diarrhea. Electronic Signature(s) Signed: 10/30/2020 8:39:44 AM By: Worthy Keeler PA-C Entered By: Worthy Keeler on [id number] [name], [name] ([id number]) -------------------------------------------------------------------------------- Physical Exam Details Patient Name: [name] Date of Service: 10/30/2020 8:00 AM Medical Record Number: [id number] Patient Account Number: [id number] Date of Birth/Sex: 07/01/1950 (69 y.o. F) Treating RN: Dolan Amen Primary Care Provider: Miguel Aschoff Other Clinician: Jeanine Luz Referring Provider: Miguel Aschoff Treating Provider/Extender: Jeri Cos Weeks in Treatment: 6 Constitutional Well-nourished and well-hydrated in no acute distress. Respiratory normal breathing without difficulty. Psychiatric this patient is able to make decisions and demonstrates good insight into disease process. Alert and Oriented x 3. pleasant and  cooperative. Notes Upon inspection patient's wound bed showed signs of good granulation epithelization at this point. There does not appear to be any evidence of active infection which is great news and overall I am extremely pleased with where things stand today. Electronic Signature(s) Signed: 10/30/2020 8:39:59 AM By: Worthy Keeler PA-C Entered By: Worthy Keeler on [id number] [name], [name] ([id number]) -------------------------------------------------------------------------------- Physician Orders Details Patient Name: [name] Date of Service: 10/30/2020 8:00 AM Medical Record Number: [id number] Patient Account Number: [id number] Date of Birth/Sex: Jan 28, 1951 (69 y.o. 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 Put garments on first thing when you wake up and remove them before bed. o Moisturize legs daily after removing compression garments. Electronic Signature(s) Signed: 10/30/2020 5:02:32 PM By: Gretta Cool, BSN, RN, CWS, Kim RN, BSN Signed: 10/30/2020 8:15:19 PM By: Worthy Keeler PA-C Entered By: Gretta Cool BSN, RN, CWS, Kim on [id number] [name], [name] ([id number]) -------------------------------------------------------------------------------- Problem List Details Patient Name:  [name] Date of Service: 10/30/2020 8:00 AM Medical Record Number: [id number] Patient Account Number: [id number] Date of Birth/Sex: 1951/03/02 (69 y.o. 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 F) Treating RN: Dolan Amen Primary Care Provider: Miguel Aschoff Other Clinician: Jeanine Luz Referring Provider: Miguel Aschoff Treating Provider/Extender: Skipper Cliche in Treatment: 6 Subjective Chief Complaint Information obtained from Patient Left LE Surgical Ulcer History of Present Illness (HPI) 09/18/2020 upon evaluation today patient presents for initial inspection here in clinic concerning issues that she has been having unfortunately with a wound on [name] which has been present actually since December 7 when she had a squamous cell carcinoma  removed. Subsequently I did review the pictures that she had on [name] of the carcinoma site post excision and it was quite significant. With that being said the tension that was needed to pull the edges of the wound together to approximate apparently caused some issues as far as appropriate healing was concerned and this became necrotic and unfortunately dehisced. Subsequently the patient has since been undergoing treatment at the [facility] where to be honest they have been attempting as much as possible to manage both infections as well as the overall swelling of the leg. They have been placing the patient in Unna boots for the past 3 weeks. Subsequently she also did have a culture which was on 08/27/2020 that showed Enterococcus and then she was on doxycycline at that point. Subsequently she was switched to amoxicillin based on the culture on 09/02/2020 she is done with that at this point. With that being said the patient is very frustrated with how long this has been going on she notes that today during our conversation. This was a squamous cell carcinoma that led to this need for surgery. 09/25/2020 upon evaluation today patient appears to be doing well with regard to [name]. She is tolerating the dressing changes without complication fortunately there does not appear to be any signs of infection which is great news. No fevers, chills, nausea, vomiting, or diarrhea. She is going to require little bit of debridement today in regard to an area that is somewhat appearing to be a hematoma in the upper portion of the wound this is very tiny but nonetheless does seem to have some fluid collecting in this area. 10/02/2020 upon evaluation today patient appears to be doing well currently with regard to [name]. This is showing signs of excellent improvement which is great news and overall very pleased with where things stand today. 10/09/2020 upon evaluation today patient appears to  be doing well with regard to [name]. This is measuring smaller yet again. With that being said she does have a little bit of an opening where last week we found a small hematoma unfortunately that is still taking his time as far as healing is concerned. The Hydrofera Blue is done excellent I feel like we may want to switch over to collagen at this point with some  of the deeper areas noted. 4/28; the patient is left with a small open area although it is left to the original wound. This may have been the area that they found a small hematoma recently in this clinic. Been using silver collagen. There is no other superficial areas all of this is epithelialized 10/23/2020 upon evaluation today patient appears to be doing well with regard to [name]. She is making excellent progress and in fact there is just a very tiny area that still open at this point where we had found the small blood clot. This is significantly improved and I am very pleased with where we stand currently. There does not appear to be any signs of infection which is great news as well. No fevers, chills, nausea, vomiting, or diarrhea. 10/30/2020 upon evaluation today patient appears to be doing excellent in regard to [name]. She has been tolerating the dressing changes without complication. Fortunately there is no sign of active infection at this time. No fevers, chills, nausea, vomiting, or diarrhea. Objective Constitutional Well-nourished and well-hydrated in no acute distress. Vitals Time Taken: 8:05 AM, Temperature: 98.6 F, Pulse: 81 bpm, Respiratory Rate: 16 breaths/min, Blood Pressure: 116/64 mmHg. Respiratory normal breathing without difficulty. Psychiatric this patient is able to make decisions and demonstrates good insight into disease process. Alert and Oriented x 3. pleasant and cooperative. General Notes: Upon inspection patient's wound bed showed signs of good granulation epithelization at this point. There does  not appear to be [name], [name] ([id number]) any evidence of active infection which is great news and overall I am extremely pleased with where things stand today. Integumentary (Hair, Skin) Wound #1 status is Healed - Epithelialized. Original cause of wound was Surgical Injury. The date acquired was: 05/27/2020. The wound has been in treatment 6 weeks. The wound is located on the Left,Medial Lower Leg. The wound measures 0cm length x 0cm width x 0cm depth; 0cm^2 area and 0cm^3 volume. There is no tunneling or undermining noted. There is a none present amount of drainage noted. There is no granulation within the wound bed. There is no necrotic tissue within the wound bed. 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 Put garments on first thing when you wake up and remove them before bed. Moisturize legs daily after removing compression garments. 1. Would recommend that we going continue with the wound care measures as before and the patient is in agreement with plan that includes the use of the protective dressing. I do not think she needs any medication on the wound as it is completely healed. 2. I am also can recommend that she utilize the juxta light compression wrap she has this at home and she did figure out how to put it on appropriately which is also and to be honest I think that this will do quite well for [name]. She has been be leaving later today for [name] trip. Obviously I think she should get out and walk around every couple of hours or so. We will see the patient back for follow-up visit as  needed. Electronic Signature(s) Signed: 10/30/2020 8:40:38 AM By: Worthy Keeler PA-C Entered By: Worthy Keeler on 18/84/1660 63:01:60 [name],  [name] ([id number]) -------------------------------------------------------------------------------- SuperBill Details Patient Name: [name], [name] Date of Service: 10/30/2020 Medical Record Number: [id number] Patient Account Number: [id number] Date of Birth/Sex: 03/10/1951 (69 y.o. 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, including hip Facility Procedures CPT4 Code: [id number] Description: [phone] - WOUND CARE VISIT-LEV 2 EST PT Modifier: Quantity: 1 Physician Procedures CPT4 Code: [id number] Description: [id number] - WC PHYS LEVEL 3 - EST PT Modifier: Quantity: 1 CPT4 Code: Description: ICD-10 Diagnosis Description T81.31XA Disruption of external operation (surgical) wound, not elsewhere classifi L97.822 Non-pressure chronic ulcer of other part of left lower leg with fat layer L03.116 Cellulitis of left lower limb C44.729  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

## 2020-10-31 NOTE — Progress Notes (Signed)
ZORIYAH, SCHEIDEGGER ([id number]) Visit Report for 10/30/2020 Arrival Information Details Patient Name: [name], [name] Date of Service: 10/30/2020 8:00 AM Medical Record Number: [id number] Patient Account Number: [id number] Date of Birth/Sex: 05-29-1951 (69 y.o. F) Treating RN: Hansel Feinstein Primary Care Sukhman Kocher: Julieanne Manson Other Clinician: Lolita Cram Referring Jerah Esty: Julieanne Manson Treating Kaelee Pfeffer/Extender: Rowan Blase in Treatment: 6 Visit Information History Since Last Visit Added or deleted any medications: No Patient Arrived: Ambulatory Had a fall or experienced change in No Arrival Time: 08:06 activities of daily living that may affect Accompanied By: self risk of falls: Transfer Assistance: None Hospitalized since last visit: No Patient Identification Verified: Yes Has Dressing in Place as Prescribed: Yes Secondary Verification Process Completed: Yes Pain Present Now: No Patient Requires Transmission-Based Precautions: No Patient Has Alerts: No Electronic Signature(s) Signed: 10/31/2020 10:34:15 AM By: Hansel Feinstein Entered By: Hansel Feinstein on 10/30/2020 08:08:02 [name], [name] ([id number]) -------------------------------------------------------------------------------- Clinic Level of Care Assessment Details Patient Name: [name] Date of Service: 10/30/2020 8:00 AM Medical Record Number: [id number] Patient Account Number: [id number] Date of Birth/Sex: 07/22/1950 (69 y.o. F) Treating RN: Huel Coventry Primary Care Annalee Meyerhoff: Julieanne Manson Other Clinician: Lolita Cram Referring Graclyn Lawther: Julieanne Manson Treating Keenan Dimitrov/Extender: Rowan Blase in Treatment: 6 Clinic Level of Care Assessment Items TOOL 4 Quantity Score []  - Use when only an EandM is performed on FOLLOW-UP visit 0 ASSESSMENTS - Nursing Assessment / Reassessment X - Reassessment of Co-morbidities (includes updates in patient status) 1 10 X- 1  5 Reassessment of Adherence to Treatment Plan ASSESSMENTS - Wound and Skin Assessment / Reassessment X - Simple Wound Assessment / Reassessment - one wound 1 5 []  - 0 Complex Wound Assessment / Reassessment - multiple wounds []  - 0 Dermatologic / Skin Assessment (not related to wound area) ASSESSMENTS - Focused Assessment []  - Circumferential Edema Measurements - multi extremities 0 []  - 0 Nutritional Assessment / Counseling / Intervention []  - 0 Lower Extremity Assessment (monofilament, tuning fork, pulses) []  - 0 Peripheral Arterial Disease Assessment (using hand held doppler) ASSESSMENTS - Ostomy and/or Continence Assessment and Care []  - Incontinence Assessment and Management 0 []  - 0 Ostomy Care Assessment and Management (repouching, etc.) PROCESS - Coordination of Care X - Simple Patient / Family Education for ongoing care 1 15 []  - 0 Complex (extensive) Patient / Family Education for ongoing care []  - 0 Staff obtains , Records, Test Results / Process Orders []  - 0 Staff telephones HHA, Nursing Homes / Clarify orders / etc []  - 0 Routine Transfer to another Facility (non-emergent condition) []  - 0 Routine Hospital Admission (non-emergent condition) []  - 0 New Admissions / / Ordering NPWT, Apligraf, etc. []  - 0 Emergency Hospital Admission (emergent condition) X- 1 10 Simple Discharge Coordination []  - 0 Complex (extensive) Discharge Coordination PROCESS - Special Needs []  - Pediatric / Minor Patient Management 0 []  - 0 Isolation Patient Management []  - 0 Hearing / Language / Visual special needs []  - 0 Assessment of Community assistance (transportation, D/C planning, etc.) []  - 0 Additional assistance / Altered mentation []  - 0 Support Surface(s) Assessment (bed, cushion, seat, etc.) INTERVENTIONS - Wound Cleansing / Measurement [name], [name] ( ) X- 1 5 Simple Wound Cleansing - one wound []  - 0 Complex  Wound Cleansing - multiple wounds X- 1 5 Wound Imaging (photographs - any number of wounds) []  - 0 Wound Tracing (instead of photographs) X- 1 5 Simple Wound Measurement - one wound []  - 0 Complex Wound Measurement -  multiple wounds INTERVENTIONS - Wound Dressings X - Small Wound Dressing one or multiple wounds 1 10 []  - 0 Medium Wound Dressing one or multiple wounds []  - 0 Large Wound Dressing one or multiple wounds []  - 0 Application of Medications - topical []  - 0 Application of Medications - injection INTERVENTIONS - Miscellaneous []  - External ear exam 0 []  - 0 Specimen Collection (cultures, biopsies, blood, body fluids, etc.) []  - 0 Specimen(s) / Culture(s) sent or taken to Lab for analysis []  - 0 Patient Transfer (multiple staff / Civil Service fast streamer / Similar devices) []  - 0 Simple Staple / Suture removal (25 or less) []  - 0 Complex Staple / Suture removal (26 or more) []  - 0 Hypo / Hyperglycemic Management (close monitor of Blood Glucose) []  - 0 Ankle / Brachial Index (ABI) - do not check if billed separately X- 1 5 Vital Signs Has the patient been seen at the hospital within the last three years: Yes Total Score: 75 Level Of Care: New/Established - Level 2 Electronic Signature(s) Signed: 10/30/2020 5:02:32 PM By: Gretta Cool, BSN, RN, CWS, Kim RN, BSN Entered By: Gretta Cool, BSN, RN, CWS, Kim on 37/16/9678 93:81:01 [name], [name] ([id number]) -------------------------------------------------------------------------------- Encounter Discharge Information Details Patient Name: [name] Date of Service: 10/30/2020 8:00 AM Medical Record Number: [id number] Patient Account Number: [id number] Date of Birth/Sex: June 30, 1950 (69 y.o. F) Treating RN: Cornell Barman Primary Care Seyed Heffley: Miguel Aschoff Other Clinician: Jeanine Luz Referring Kristien Salatino: Miguel Aschoff Treating Valary Manahan/Extender: Skipper Cliche in Treatment: 6 Encounter Discharge Information  Items Discharge Condition: Stable Ambulatory Status: Ambulatory Discharge Destination: Home Transportation: Private Auto Accompanied By: self Schedule Follow-up Appointment: No Clinical Summary of Care: Electronic Signature(s) Signed: 10/30/2020 5:02:32 PM By: Gretta Cool, BSN, RN, CWS, Kim RN, BSN Entered By: Gretta Cool, BSN, RN, CWS, Kim on 61/44/3154 00:86:76 Nunez, [name] ([id number]) -------------------------------------------------------------------------------- Lower Extremity Assessment Details Patient Name: [name] Date of Service: 10/30/2020 8:00 AM Medical Record Number: [id number] Patient Account Number: [id number] Date of Birth/Sex: June 03, 1951 (69 y.o. F) Treating RN: Donnamarie Poag Primary Care Sabria Florido: Miguel Aschoff Other Clinician: Jeanine Luz Referring Cameren Earnest: Miguel Aschoff Treating Danitza Schoenfeldt/Extender: Jeri Cos Weeks in Treatment: 6 Edema Assessment Assessed: [Left: Yes] [Right: No] Edema: [Left: N] [Right: o] Calf Left: Right: Point of Measurement: 39 cm From Medial Instep 36 cm Ankle Left: Right: Point of Measurement: 10 cm From Medial Instep 21 cm Vascular Assessment Pulses: Dorsalis Pedis Palpable: [Left:Yes] Electronic Signature(s) Signed: 10/31/2020 10:34:15 AM By: Donnamarie Poag Entered By: Donnamarie Poag on 33/82/5053 97:67:34 [name], Odaly ([id number]) -------------------------------------------------------------------------------- Multi Wound Chart Details Patient Name: [name] Date of Service: 10/30/2020 8:00 AM Medical Record Number: [id number] Patient Account Number: [id number] Date of Birth/Sex: [birth year] (69 y.o. F) Treating RN: Cornell Barman Primary Care Berkeley Veldman: Miguel Aschoff Other Clinician: Jeanine Luz Referring Percival Glasheen: Miguel Aschoff Treating Valgene Deloatch/Extender: Skipper Cliche in Treatment: 6 Vital Signs Height(in): Pulse(bpm): 45 Weight(lbs): Blood Pressure(mmHg): 116/64 Body  Mass Index(BMI): Temperature(F): 98.6 Respiratory Rate(breaths/min): 16 Photos: [N/A:N/A] Wound Location: Left, Medial Lower Leg N/A N/A Wounding Event: Surgical Injury N/A N/A Primary Etiology: Dehisced Wound N/A N/A Comorbid History: Cataracts, Gout N/A N/A Date Acquired: 05/27/2020 N/A N/A Weeks of Treatment: 6 N/A N/A Wound Status: Healed - Epithelialized N/A N/A Measurements L x W x D (cm) 0x0x0 N/A N/A Area (cm) : 0 N/A N/A Volume (cm) : 0 N/A N/A % Reduction in Area: 100.00% N/A N/A % Reduction in Volume: 100.00% N/A N/A Classification: Full Thickness Without Exposed N/A N/A Support Structures Exudate Amount: None  Present N/A N/A Granulation Amount: None Present (0%) N/A N/A Necrotic Amount: None Present (0%) N/A N/A Exposed Structures: Fascia: No N/A N/A Fat Layer (Subcutaneous Tissue): No Tendon: No Muscle: No Joint: No Bone: No Epithelialization: Large (67-100%) N/A N/A Treatment Notes Electronic Signature(s) Signed: 10/30/2020 5:02:32 PM By: Gretta Cool, BSN, RN, CWS, Kim RN, BSN Entered By: Gretta Cool, BSN, RN, CWS, Kim on 50/38/8828 00:34:91 [name], [name] ([id number]) -------------------------------------------------------------------------------- Multi-Disciplinary Care Plan Details Patient Name: [name] Date of Service: 10/30/2020 8:00 AM Medical Record Number: [id number] Patient Account Number: [id number] Date of Birth/Sex: 1950/10/13 (69 y.o. F) Treating RN: Cornell Barman Primary Care Bricen Victory: Miguel Aschoff Other Clinician: Jeanine Luz Referring Huyen Perazzo: Miguel Aschoff Treating Leam Madero/Extender: Skipper Cliche in Treatment: 6 Active Inactive Electronic Signature(s) Signed: 10/30/2020 5:02:32 PM By: Gretta Cool, BSN, RN, CWS, Kim RN, BSN Entered By: Gretta Cool, BSN, RN, CWS, Kim on 74/82/7078 67:54:49 [name], [name] ([id number]) -------------------------------------------------------------------------------- Pain Assessment  Details Patient Name: [name] Date of Service: 10/30/2020 8:00 AM Medical Record Number: [id number] Patient Account Number: [id number] Date of Birth/Sex: 18-Mar-1951 (69 y.o. F) Treating RN: Donnamarie Poag Primary Care Sheneika Walstad: Miguel Aschoff Other Clinician: Jeanine Luz Referring Fraya Ueda: Miguel Aschoff Treating Alecea Trego/Extender: Skipper Cliche in Treatment: 6 Active Problems Location of Pain Severity and Description of Pain Patient Has Paino No Site Locations Rate the pain. Current Pain Level: 0 Pain Management and Medication Current Pain Management: Electronic Signature(s) Signed: 10/31/2020 10:34:15 AM By: Donnamarie Poag Entered By: Donnamarie Poag on 98/26/4158 30:94:07 [name], [name] ([id number]) -------------------------------------------------------------------------------- Patient/Caregiver Education Details Patient Name: [name] Date of Service: 10/30/2020 8:00 AM Medical Record Number: [id number] Patient Account Number: [id number] Date of Birth/Gender: September 25, 1950 (69 y.o. F) Treating RN: Cornell Barman Primary Care Physician: Miguel Aschoff Other Clinician: Jeanine Luz Referring Physician: Miguel Aschoff Treating Physician/Extender: Skipper Cliche in Treatment: 6 Education Assessment Education Provided To: Patient Education Topics Provided Basic Hygiene: Venous: Handouts: Controlling Swelling with Multilayered Compression Wraps, Other: lotion legs at night before bed Methods: Demonstration, Explain/Verbal Responses: State content correctly Wound/Skin Impairment: Electronic Signature(s) Signed: 10/30/2020 5:02:32 PM By: Gretta Cool, BSN, RN, CWS, Kim RN, BSN Entered By: Gretta Cool, BSN, RN, CWS, Kim on 92/44/6286 38:17:71 [name], [name] ([id number]) -------------------------------------------------------------------------------- Wound Assessment Details Patient Name: [name] Date of Service: 10/30/2020  8:00 AM Medical Record Number: [id number] Patient Account Number: [id number] Date of Birth/Sex: June 14, 1951 (69 y.o. F) Treating RN: Cornell Barman Primary Care Lekeisha Arenas: Miguel Aschoff Other Clinician: Jeanine Luz Referring Ryanna Teschner: Miguel Aschoff Treating Stevie Ertle/Extender: Jeri Cos Weeks in Treatment: 6 Wound Status Wound Number: 1 Primary Etiology: Dehisced Wound Wound Location: Left, Medial Lower Leg Wound Status: Healed - Epithelialized Wounding Event: Surgical Injury Comorbid History: Cataracts, Gout Date Acquired: 05/27/2020 Weeks Of Treatment: 6 Clustered Wound: No Photos Wound Measurements Length: (cm) 0 Width: (cm) 0 Depth: (cm) 0 Area: (cm) 0 Volume: (cm) 0 % Reduction in Area: 100% % Reduction in Volume: 100% Epithelialization: Large (67-100%) Tunneling: No Undermining: No Wound Description Classification: Full Thickness Without Exposed Support Structure Exudate Amount: None Present s Foul Odor After Cleansing: No Slough/Fibrino No Wound Bed Granulation Amount: None Present (0%) Exposed Structure Necrotic Amount: None Present (0%) Fascia Exposed: No Fat Layer (Subcutaneous Tissue) Exposed: No Tendon Exposed: No Muscle Exposed: No Joint Exposed: No Bone Exposed: No Treatment Notes Wound #1 (Lower Leg) Wound Laterality: Left, Medial Cleanser Peri-Wound Care Topical Primary Dressing [name], Lashay ([id number]) Secondary Dressing Secured With Compression Wrap Compression Stockings Add-Ons Electronic Signature(s) Signed: 10/30/2020 5:02:32 PM By: Gretta Cool, BSN, RN, CWS, Kim RN, BSN  Entered By: Gretta Cool, BSN, RN, CWS, Kim on 77/41/2878 67:67:20 [name], [name] ([id number]) -------------------------------------------------------------------------------- Vitals Details Patient Name: [name] Date of Service: 10/30/2020 8:00 AM Medical Record Number: [id number] Patient Account Number: [id number] Date of Birth/Sex: Aug 26, 1950  (69 y.o. F) Treating RN: Donnamarie Poag Primary Care Madylyn Insco: Miguel Aschoff Other Clinician: Jeanine Luz Referring Skie Vitrano: Miguel Aschoff Treating Jeramy Dimmick/Extender: Skipper Cliche in Treatment: 6 Vital Signs Time Taken: 08:05 Temperature (F): 98.6 Pulse (bpm): 81 Respiratory Rate (breaths/min): 16 Blood Pressure (mmHg): 116/64 Reference Range: 80 - 120 mg / dl Electronic Signature(s) Signed: 10/31/2020 10:34:15 AM By: Donnamarie Poag Entered ByDonnamarie Poag on 10/30/2020 65:03:54

## 2020-11-05 ENCOUNTER — Encounter: Payer: Medicare Other | Admitting: Dermatology

## 2020-11-08 ENCOUNTER — Other Ambulatory Visit: Payer: Self-pay | Admitting: Family Medicine

## 2020-11-08 NOTE — Telephone Encounter (Signed)
Requested Prescriptions  Pending Prescriptions Disp Refills  . levothyroxine (SYNTHROID) 75 MCG tablet [Pharmacy Med Name: LEVOTHYROXINE 0.075MG  (75MCG) TABS] 30 tablet 2    Sig: TAKE 1 TABLET(75 MCG) BY MOUTH DAILY- MUST HAVE PRIOR ORDERED LABS DRAWN PRIOR TO FURTHER REFILLS     Endocrinology:  Hypothyroid Agents Failed - 11/08/2020  6:53 AM      Failed - TSH needs to be rechecked within 3 months after an abnormal result. Refill until TSH is due.      Passed - TSH in normal range and within 360 days    TSH  Date Value Ref Range Status  12/31/2019 2.070 0.450 - 4.500 uIU/mL Final         Passed - Valid encounter within last 12 months    Recent Outpatient Visits          1 month ago Encounter for annual wellness visit (AWV) in Medicare patient   [facility] Jerrol Banana., MD   1 month ago Encounter for annual wellness visit (AWV) in Medicare patient   [facility] Jerrol Banana., MD   4 months ago [place], [place], [place]   8 months ago Hypertension, unspecified type   [facility] Jerrol Banana., MD   10 months ago Neuropathy   [facility] Jerrol Banana., MD      Future Appointments            In 2 months [facility], [place], MD [place]   In 3 months Jerrol Banana., MD [facility], [place]

## 2020-11-15 ENCOUNTER — Encounter: Payer: Self-pay | Admitting: Dermatology

## 2020-11-24 ENCOUNTER — Other Ambulatory Visit: Payer: Self-pay | Admitting: Physician Assistant

## 2020-12-25 DIAGNOSIS — Z0000 Encounter for general adult medical examination without abnormal findings: Secondary | ICD-10-CM | POA: Diagnosis not present

## 2020-12-25 DIAGNOSIS — I10 Essential (primary) hypertension: Secondary | ICD-10-CM | POA: Diagnosis not present

## 2020-12-25 DIAGNOSIS — E7849 Other hyperlipidemia: Secondary | ICD-10-CM | POA: Diagnosis not present

## 2020-12-25 DIAGNOSIS — E039 Hypothyroidism, unspecified: Secondary | ICD-10-CM | POA: Diagnosis not present

## 2020-12-26 LAB — LIPID PANEL
Chol/HDL Ratio: 4.4 ratio (ref 0.0–4.4)
Cholesterol, Total: 189 mg/dL (ref 100–199)
HDL: 43 mg/dL (ref >39)
LDL Chol Calc (NIH): 100 mg/dL — ABNORMAL HIGH (ref 0–99)
Triglycerides: 274 mg/dL — ABNORMAL HIGH (ref 0–149)
VLDL Cholesterol Cal: 46 mg/dL — ABNORMAL HIGH (ref 5–40)

## 2020-12-26 LAB — CBC WITH DIFFERENTIAL/PLATELET
Basophils Absolute: 0.1 10*3/uL (ref 0.0–0.2)
Basos: 1 %
EOS (ABSOLUTE): 0.2 10*3/uL (ref 0.0–0.4)
Eos: 2 %
Hematocrit: 41.1 % (ref 34.0–46.6)
Hemoglobin: 13.8 g/dL (ref 11.1–15.9)
Immature Grans (Abs): 0.1 10*3/uL (ref 0.0–0.1)
Immature Granulocytes: 1 %
Lymphocytes Absolute: 1.6 10*3/uL (ref 0.7–3.1)
Lymphs: 21 %
MCH: 30.5 pg (ref 26.6–33.0)
MCHC: 33.6 g/dL (ref 31.5–35.7)
MCV: 91 fL (ref 79–97)
Monocytes Absolute: 0.6 10*3/uL (ref 0.1–0.9)
Monocytes: 8 %
Neutrophils Absolute: 5.3 10*3/uL (ref 1.4–7.0)
Neutrophils: 67 %
Platelets: 215 10*3/uL (ref 150–450)
RBC: 4.53 x10E6/uL (ref 3.77–5.28)
RDW: 13 % (ref 11.7–15.4)
WBC: 7.8 10*3/uL (ref 3.4–10.8)

## 2020-12-26 LAB — COMPREHENSIVE METABOLIC PANEL
ALT: 27 IU/L (ref 0–32)
AST: 24 IU/L (ref 0–40)
Albumin/Globulin Ratio: 1.9 (ref 1.2–2.2)
Albumin: 4.7 g/dL (ref 3.8–4.8)
Alkaline Phosphatase: 96 IU/L (ref 44–121)
BUN/Creatinine Ratio: 23 (ref 12–28)
BUN: 25 mg/dL (ref 8–27)
Bilirubin Total: 0.6 mg/dL (ref 0.0–1.2)
CO2: 20 mmol/L (ref 20–29)
Calcium: 9.9 mg/dL (ref 8.7–10.3)
Chloride: 102 mmol/L (ref 96–106)
Creatinine, Ser: 1.08 mg/dL — ABNORMAL HIGH (ref 0.57–1.00)
Globulin, Total: 2.5 g/dL (ref 1.5–4.5)
Glucose: 102 mg/dL — ABNORMAL HIGH (ref 65–99)
Potassium: 3.5 mmol/L (ref 3.5–5.2)
Sodium: 141 mmol/L (ref 134–144)
Total Protein: 7.2 g/dL (ref 6.0–8.5)
eGFR: 56 mL/min/{1.73_m2} — ABNORMAL LOW (ref >59)

## 2020-12-26 LAB — TSH: TSH: 2.53 u[IU]/mL (ref 0.450–4.500)

## 2021-01-14 ENCOUNTER — Other Ambulatory Visit: Payer: Self-pay | Admitting: Family Medicine

## 2021-01-14 NOTE — Telephone Encounter (Signed)
  Notes to clinic:  Patient has upcoming appt on 02/10/2021 Review for 90 day supply    Requested Prescriptions  Pending Prescriptions Disp Refills   allopurinol (ZYLOPRIM) 300 MG tablet [Pharmacy Med Name: ALLOPURINOL '300MG'$  TABLETS] 90 tablet 1    Sig: TAKE 1 TABLET(300 MG) BY MOUTH DAILY      Endocrinology:  Gout Agents Failed - 01/14/2021 11:30 AM      Failed - Uric Acid in normal range and within 360 days    Uric Acid  Date Value Ref Range Status  12/31/2019 5.8 3.0 - 7.2 mg/dL Final    Comment:               Therapeutic target for gout patients: <6.0          Failed - Cr in normal range and within 360 days    Creatinine, Ser  Date Value Ref Range Status  12/25/2020 1.08 (H) 0.57 - 1.00 mg/dL Final          Passed - Valid encounter within last 12 months    Recent Outpatient Visits           3 months ago Encounter for annual wellness visit (AWV) in Medicare patient   [facility] Jerrol Banana., MD   3 months ago Encounter for annual wellness visit (AWV) in Medicare patient   [facility] Jerrol Banana., MD   6 months ago [place], [place] M, [place]   10 months ago Hypertension, unspecified type   [facility] Jerrol Banana., MD   1 year ago Neuropathy   [facility] Jerrol Banana., MD       Future Appointments             In 1 week [facility], [place], MD [place]   In 3 weeks Jerrol Banana., MD [facility], [place]

## 2021-01-21 ENCOUNTER — Ambulatory Visit: Payer: Medicare Other | Admitting: Dermatology

## 2021-01-21 ENCOUNTER — Other Ambulatory Visit: Payer: Self-pay | Admitting: Family Medicine

## 2021-01-21 ENCOUNTER — Other Ambulatory Visit: Payer: Self-pay

## 2021-01-21 ENCOUNTER — Encounter: Payer: Self-pay | Admitting: Dermatology

## 2021-01-21 DIAGNOSIS — L920 Granuloma annulare: Secondary | ICD-10-CM

## 2021-01-21 DIAGNOSIS — Z86018 Personal history of other benign neoplasm: Secondary | ICD-10-CM

## 2021-01-21 DIAGNOSIS — Z1283 Encounter for screening for malignant neoplasm of skin: Secondary | ICD-10-CM

## 2021-01-21 DIAGNOSIS — D485 Neoplasm of uncertain behavior of skin: Secondary | ICD-10-CM

## 2021-01-21 DIAGNOSIS — R21 Rash and other nonspecific skin eruption: Secondary | ICD-10-CM

## 2021-01-21 DIAGNOSIS — D225 Melanocytic nevi of trunk: Secondary | ICD-10-CM | POA: Diagnosis not present

## 2021-01-21 DIAGNOSIS — D1800 Hemangioma unspecified site: Secondary | ICD-10-CM

## 2021-01-21 DIAGNOSIS — L820 Inflamed seborrheic keratosis: Secondary | ICD-10-CM

## 2021-01-21 DIAGNOSIS — Z85820 Personal history of malignant melanoma of skin: Secondary | ICD-10-CM

## 2021-01-21 DIAGNOSIS — L814 Other melanin hyperpigmentation: Secondary | ICD-10-CM

## 2021-01-21 DIAGNOSIS — L578 Other skin changes due to chronic exposure to nonionizing radiation: Secondary | ICD-10-CM

## 2021-01-21 DIAGNOSIS — Z85828 Personal history of other malignant neoplasm of skin: Secondary | ICD-10-CM | POA: Diagnosis not present

## 2021-01-21 DIAGNOSIS — L409 Psoriasis, unspecified: Secondary | ICD-10-CM

## 2021-01-21 DIAGNOSIS — D229 Melanocytic nevi, unspecified: Secondary | ICD-10-CM

## 2021-01-21 DIAGNOSIS — L821 Other seborrheic keratosis: Secondary | ICD-10-CM

## 2021-01-21 DIAGNOSIS — L570 Actinic keratosis: Secondary | ICD-10-CM

## 2021-01-21 HISTORY — DX: Personal history of other benign neoplasm: Z86.018

## 2021-01-21 NOTE — Progress Notes (Signed)
Follow-Up Visit   Subjective  [name] is a 70 y.o. female who presents for the following: TBSE (6 mo total body exam. Pt has hx of SCC, BCC, Melanoma, and AKs. She has a few spots that she has noticed that she would like checked today on feet, upper right leg, and right hand. ).  Patient here for full body skin exam and skin cancer screening.  The following portions of the chart were reviewed this encounter and updated as appropriate:  Tobacco  Allergies  Meds  Problems  Med Hx  Surg Hx  Fam Hx      Review of Systems: No other skin or systemic complaints except as noted in HPI or Assessment and Plan.   Objective  Well appearing patient in no apparent distress; mood and affect are within normal limits.  A full examination was performed including scalp, head, eyes, ears, nose, lips, neck, chest, axillae, abdomen, back, buttocks, bilateral upper extremities, bilateral lower extremities, hands, feet, fingers, toes, fingernails, and toenails. All findings within normal limits unless otherwise noted below.  left dorsal foot, left ankle, right dorsal ankle Annular hyperpigmented thin plaque left dorsal foot and left ankle. Hyperpigmented round patch at right dorsal ankle.   KOH negative  Right Inguinal Fold 0.6 cm erythematous papule        Upper Mid back 0.4 cm irregular dark brown macule        Right Hand - Posterior Pink firm dermal annular plaque  Left Hand - Anterior Well-demarcated erythematous papules/plaques with silvery scale, guttate pink scaly papules.   Assessment & Plan  Rash left dorsal foot, left ankle, right dorsal ankle  Favor porokeratosis. Benign-appearing on exam today.  Will measure and photograph today and recheck at f/u. Call for any changes.  Related Medications clobetasol (OLUX) 0.05 % topical foam Apply topically 2 (two) times daily. apply twice daily as needed to affected areas for two weeks then apply twice daily on  weekends only. Avoid applying to face, groin, and axilla. Use as directed. Risk of skin atrophy with long-term use reviewed.  Neoplasm of uncertain behavior of skin (2) Right Inguinal Fold  Epidermal / dermal shaving  Lesion diameter (cm):  0.6 Informed consent: discussed and consent obtained   Patient was prepped and draped in usual sterile fashion: Area prepped with alcohol. Anesthesia: the lesion was anesthetized in a standard fashion   Anesthetic:  1% lidocaine w/ epinephrine 1-100,000 buffered w/ 8.4% NaHCO3 Instrument used: flexible razor blade   Hemostasis achieved with: pressure, aluminum chloride and electrodesiccation   Outcome: patient tolerated procedure well   Post-procedure details: wound care instructions given   Post-procedure details comment:  Ointment and small bandage applied  Specimen 1 - Surgical pathology Differential Diagnosis: r/o irritated acrochordon vs other  Check Margins: yes  Upper Mid back  Epidermal / dermal shaving  Lesion diameter (cm):  0.4 Informed consent: discussed and consent obtained   Patient was prepped and draped in usual sterile fashion: Area prepped with alcohol. Anesthesia: the lesion was anesthetized in a standard fashion   Anesthetic:  1% lidocaine w/ epinephrine 1-100,000 buffered w/ 8.4% NaHCO3 Instrument used: flexible razor blade   Hemostasis achieved with: pressure, aluminum chloride and electrodesiccation   Outcome: patient tolerated procedure well   Post-procedure details: wound care instructions given   Post-procedure details comment:  Ointment and small bandage applied  Specimen 2 - Surgical pathology Differential Diagnosis: r/o atypia  Check Margins: Yes  Related Medications mupirocin ointment (BACTROBAN) 2 %  Apply 1 application topically daily.  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 Benign-appearing. Defer treatment today.  Recheck on f/u. Call for changes.  Discussed  option of intralesional triamcinolone injections or option of biopsy to confirm diagnosis in future.   Psoriasis Left Hand - Anterior  Apply clobetasol foam to hands and elbows BID PRN.   Topical steroids (such as triamcinolone, fluocinolone, fluocinonide, mometasone, clobetasol, halobetasol, betamethasone, hydrocortisone) can cause thinning and lightening of the skin if they are used for too long in the same area. Your physician has selected the right strength medicine for your problem and area affected on the body. Please use your medication only as directed by your physician to prevent side effects.   Lentigines - Scattered tan macules - Due to sun exposure - Benign-appering, observe - Recommend daily broad spectrum sunscreen SPF 30+ to sun-exposed areas, reapply every 2 hours as needed. - Call for any changes  Seborrheic Keratoses - Stuck-on, waxy, tan-brown papules and/or plaques  - Benign-appearing - Discussed benign etiology and prognosis. - Observe - Call for any changes  Melanocytic Nevi - Tan-brown and/or pink-flesh-colored symmetric macules and papules - Benign appearing on exam today - Observation - Call clinic for new or changing moles - Recommend daily use of broad spectrum spf 30+ sunscreen to sun-exposed areas.   Hemangiomas - Red papules - Discussed benign nature - Observe - Call for any changes  Actinic Damage - Severe, confluent actinic changes with pre-cancerous actinic keratoses  - Severe, chronic, not at goal, secondary to cumulative UV radiation exposure over time - diffuse scaly erythematous macules and papules with underlying dyspigmentation - Discussed Prescription "Field Treatment" for Severe, Chronic Confluent Actinic Changes with Pre-Cancerous Actinic Keratoses Field treatment involves treatment of an entire area of skin that has confluent Actinic Changes (Sun/ Ultraviolet light damage) and PreCancerous Actinic Keratoses by method of PhotoDynamic  Therapy (PDT) and/or prescription Topical Chemotherapy agents such as 5-fluorouracil, 5-fluorouracil/calcipotriene, and/or imiquimod.  The purpose is to decrease the number of clinically evident and subclinical PreCancerous lesions to prevent progression to development of skin cancer by chemically destroying early precancer changes that may or may not be visible.  It has been shown to reduce the risk of developing skin cancer in the treated area. As a result of treatment, redness, scaling, crusting, and open sores may occur during treatment course. One or more than one of these methods may be used and may have to be used several times to control, suppress and eliminate the PreCancerous changes. Discussed treatment course, expected reaction, and possible side effects. - Recommend daily broad spectrum sunscreen SPF 30+ to sun-exposed areas, reapply every 2 hours as needed.  - Staying in the shade or wearing long sleeves, sun glasses (UVA+UVB protection) and wide brim hats (4-inch brim around the entire circumference of the hat) are also recommended. - Call for new or changing lesions. -Treat right hand, right arm up to elbow, left hand, and left arm up to elbow with 5Fu/Calcipotriene cream BID for up to 7 days. Apply to nose and upper lip BID for up to 4 days. Pt has.  - Patient provided with handout reviewing treatment course and side effects and advised to call or message [place] on MyChart with any concerns.  Skin cancer screening performed today.  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  - Call if any new or changing lesions are noted between office visits  History of Melanoma Chest, ~2016 - No evidence of recurrence today - No lymphadenopathy - Recommend regular full body skin exams - Recommend daily broad spectrum sunscreen SPF 30+ to sun-exposed areas, reapply every 2 hours as needed.  - Call if any new or changing lesions are noted between office visits   Return in about 4 months (around 05/23/2021) for 3-4 months AK f/u.  I, Harriett Sine, CMA, am acting as scribe for Forest Gleason, MD.  Documentation: I have reviewed the above documentation for accuracy and completeness, and I agree with the above.  Forest Gleason, MD

## 2021-01-21 NOTE — Patient Instructions (Addendum)
Wound Care Instructions  Cleanse wound gently with soap and water once a day then pat dry with clean gauze. Apply a thing coat of Petrolatum (petroleum jelly, "Vaseline") over the wound (unless you have an allergy to this). We recommend that you use a new, sterile tube of Vaseline. Do not pick or remove scabs. Do not remove the yellow or white "healing tissue" from the base of the wound.  Cover the wound with fresh, clean, nonstick gauze and secure with paper tape. You may use Band-Aids in place of gauze and tape if the would is small enough, but would recommend trimming much of the tape off as there is often too much. Sometimes Band-Aids can irritate the skin.  You should call the office for your biopsy report after 1 week if you have not already been contacted.  If you experience any problems, such as abnormal amounts of bleeding, swelling, significant bruising, significant pain, or evidence of infection, please call the office immediately.  FOR ADULT SURGERY PATIENTS: If you need something for pain relief you may take 1 extra strength Tylenol (acetaminophen) AND 2 Ibuprofen ('200mg'$  each) together every 4 hours as needed for pain. (do not take these if you are allergic to them or if you have a reason you should not take them.) Typically, you may only need pain medication for 1 to 3 days.     5-Fluorouracil/Calcipotriene Patient Education   Actinic keratoses are the dry, red scaly spots on the skin caused by sun damage. A portion of these spots can turn into skin cancer with time, and treating them can help prevent development of skin cancer.   Treatment of these spots requires removal of the defective skin cells. There are various ways to remove actinic keratoses, including freezing with liquid nitrogen, treatment with creams, or treatment with a blue light procedure in the office.   5-fluorouracil cream is a topical cream used to treat actinic keratoses. It works by interfering with the growth  of abnormal fast-growing skin cells, such as actinic keratoses. These cells peel off and are replaced by healthy ones.   5-fluorouracil/calcipotriene is a combination of the 5-fluorouracil cream with a vitamin D analog cream called calcipotriene. The calcipotriene alone does not treat actinic keratoses. However, when it is combined with 5-fluorouracil, it helps the 5-fluorouracil treat the actinic keratoses much faster so that the same results can be achieved with a much shorter treatment time.  INSTRUCTIONS FOR 5-FLUOROURACIL/CALCIPOTRIENE CREAM:   5-fluorouracil/calcipotriene cream typically only needs to be used for 4-7 days. A thin layer should be applied twice a day to the treatment areas recommended by your physician.   If your physician prescribed you separate tubes of 5-fluourouracil and calcipotriene, apply a thin layer of 5-fluorouracil followed by a thin layer of calcipotriene.   Avoid contact with your eyes, nostrils, and mouth. Do not use 5-fluorouracil/calcipotriene cream on infected or open wounds.   You will develop redness, irritation and some crusting at areas where you have pre-cancer damage/actinic keratoses. IF YOU DEVELOP PAIN, BLEEDING, OR SIGNIFICANT CRUSTING, STOP THE TREATMENT EARLY - you have already gotten a good response and the actinic keratoses should clear up well.  Wash your hands after applying 5-fluorouracil 5% cream on your skin.   A moisturizer or sunscreen with a minimum SPF 30 should be applied each morning.   Once you have finished the treatment, you can apply a thin layer of Vaseline twice a day to irritated areas to soothe and calm the areas more  quickly. If you experience significant discomfort, contact your physician.  For some patients it is necessary to repeat the treatment for best results.  SIDE EFFECTS: When using 5-fluorouracil/calcipotriene cream, you may have mild irritation, such as redness, dryness, swelling, or a mild burning sensation.  This usually resolves within 2 weeks. The more actinic keratoses you have, the more redness and inflammation you can expect during treatment. Eye irritation has been reported rarely. If this occurs, please let us know.  If you have any trouble using this cream, please call the office. If you have any other questions about this information, please do not hesitate to ask me before you leave the office.

## 2021-01-30 ENCOUNTER — Other Ambulatory Visit: Payer: Self-pay | Admitting: Family Medicine

## 2021-01-30 DIAGNOSIS — I10 Essential (primary) hypertension: Secondary | ICD-10-CM

## 2021-01-30 NOTE — Telephone Encounter (Signed)
Requested medication (s) are due for refill today: yes   Requested medication (s) are on the active medication list:yes   Last refill:  10/25/2020  Future visit scheduled: yes  Notes to clinic:  Patient has appt on 02/10/2021 Review for refill    Requested Prescriptions  Pending Prescriptions Disp Refills   triamterene-hydrochlorothiazide (DYAZIDE) 37.5-25 MG capsule [Pharmacy Med Name: TRIAMTERENE 37.'5MG'$ / HCTZ '25MG'$  CAPS] 90 capsule 1    Sig: TAKE 1 CAPSULE BY MOUTH EVERY DAY     Cardiovascular: Diuretic Combos Failed - 01/30/2021  9:37 AM      Failed - Cr in normal range and within 360 days    Creatinine, Ser  Date Value Ref Range Status  12/25/2020 1.08 (H) 0.57 - 1.00 mg/dL Final          Failed - Valid encounter within last 6 months    Recent Outpatient Visits           3 months ago Encounter for annual wellness visit (AWV) in Medicare patient   [facility] Jerrol Banana., MD   4 months ago Encounter for annual wellness visit (AWV) in Medicare patient   [facility] Rosanna Randy, Retia Passe., MD   7 months ago [place], [place], [place]   10 months ago Hypertension, unspecified type   [facility] Jerrol Banana., MD   1 year ago Neuropathy   [facility] Jerrol Banana., MD       Future Appointments             In 1 week Jerrol Banana., MD [facility], [place]   In 3 months [place], [place], [place] in normal range and within 360 days    Potassium  Date Value Ref Range Status  12/25/2020 3.5 3.5 - 5.2 mmol/L Final          Passed - Na in normal range and within 360 days    Sodium  Date Value Ref Range Status  12/25/2020 141 134 - 144 mmol/L Final          Passed - Ca in normal range and within 360 days    Calcium  Date Value Ref Range Status  12/25/2020 9.9 8.7 - 10.3 mg/dL Final           Passed - Last BP in normal range    BP Readings from Last 1 Encounters:  10/09/20 111/73

## 2021-01-30 NOTE — Telephone Encounter (Signed)
Refilled

## 2021-02-01 ENCOUNTER — Encounter: Payer: Self-pay | Admitting: Dermatology

## 2021-02-03 ENCOUNTER — Other Ambulatory Visit: Payer: Self-pay | Admitting: Family Medicine

## 2021-02-03 NOTE — Telephone Encounter (Signed)
Ok to refill 

## 2021-02-03 NOTE — Telephone Encounter (Signed)
Medication Refill - Medication: Pt is requesting a Rx for Omeprazole, she says it would be cheaper with a Rx. She says that her and her husband are retired.   Has the patient contacted their pharmacy? No. (Agent: If no, request that the patient contact the pharmacy for the refill.) (Agent: If yes, when and what did the pharmacy advise?)  Preferred Pharmacy (with phone number or street name):  [id number] - [name], [place] AT [address]  [address] [place] [address]  Phone: [phone] Fax: [phone]    Agent: Please be advised that RX refills may take up to 3 business days. We ask that you follow-up with your pharmacy.

## 2021-02-03 NOTE — Telephone Encounter (Signed)
   Notes to clinic:  Omeprazole, she says it would be cheaper with a Rx. She says that her and her husband are retired.    Requested Prescriptions  Pending Prescriptions Disp Refills   omeprazole (PRILOSEC) 20 MG capsule      Sig: Take 1 capsule (20 mg total) by mouth daily.     Gastroenterology: Proton Pump Inhibitors Passed - 02/03/2021 10:53 AM      Passed - Valid encounter within last 12 months    Recent Outpatient Visits           3 months ago Encounter for annual wellness visit (AWV) in Medicare patient   [facility] Jerrol Banana., MD   4 months ago Encounter for annual wellness visit (AWV) in Medicare patient   [facility] Jerrol Banana., MD   7 months ago [place], Jennifer M, [place]   11 months ago Hypertension, unspecified type   [facility] Jerrol Banana., MD   1 year ago Neuropathy   [facility] Jerrol Banana., MD       Future Appointments             In 1 week Jerrol Banana., MD [facility], [place]   In 2 months [facility], [place], [place]

## 2021-02-05 MED ORDER — OMEPRAZOLE 20 MG PO CPDR: 20.0000 mg | DELAYED_RELEASE_CAPSULE | Freq: Every day | ORAL | 3 refills | Status: DC

## 2021-02-05 NOTE — Telephone Encounter (Signed)
Pt is calling checking on rx for omeprazole

## 2021-02-09 NOTE — Progress Notes (Signed)
Established patient visit   Patient: [name]   DOB: [id number]   70 y.o. Female  MRN: [id number] Visit Date: 02/10/2021  Today's healthcare provider: [name], MD   Chief Complaint  Patient presents with   Hypertension   Subjective  -------------------------------------------------------------------------------------------------------------------- HPI  Patient has a history of back pain and has seen Dr. Earle Gell in the past.  She would like to see him again.  He has been on daily Aleve for the back pain.  Hypertension, follow-up  BP Readings from Last 3 Encounters:  02/10/21 106/69  10/09/20 111/73  10/02/20 111/76   Wt Readings from Last 3 Encounters:  02/10/21 178 lb (80.7 kg)  10/09/20 179 lb (81.2 kg)  10/02/20 179 lb (81.2 kg)     She was last seen for hypertension 4 months ago.  BP at that visit was 111/73. Management since that visit includes no medication changes.  She reports good compliance with treatment. She is not having side effects.  She is following a Regular diet. She is exercising. She does not smoke.  Use of agents associated with hypertension: none.   Outside blood pressures are checked occasionally. Symptoms: No chest pain No chest pressure  No palpitations No syncope  No dyspnea No orthopnea  No paroxysmal nocturnal dyspnea No lower extremity edema   Pertinent labs: Lab Results  Component Value Date   CHOL 189 12/25/2020   HDL 43 12/25/2020   LDLCALC 100 (H) 12/25/2020   TRIG 274 (H) 12/25/2020   CHOLHDL 4.4 12/25/2020   Lab Results  Component Value Date   NA 141 12/25/2020   K 3.5 12/25/2020   CREATININE 1.08 (H) 12/25/2020   GFRNONAA 59 (L) 12/31/2019   GFRAA 69 12/31/2019   GLUCOSE 102 (H) 12/25/2020     The 10-year ASCVD risk score Mikey Bussing DC Jr., et al., 2013) is: 8.2%   --------------------------------------------------------------------------------------------------- Lipid/Cholesterol,  Follow-up  Last lipid panel Other pertinent labs  Lab Results  Component Value Date   CHOL 189 12/25/2020   HDL 43 12/25/2020   LDLCALC 100 (H) 12/25/2020   TRIG 274 (H) 12/25/2020   CHOLHDL 4.4 12/25/2020   Lab Results  Component Value Date   ALT 27 12/25/2020   AST 24 12/25/2020   PLT 215 12/25/2020   TSH 2.530 12/25/2020     She was last seen for this 4 months ago.  Management since that visit includes no medication changes.  She reports good compliance with treatment. She is not having side effects.   --------------------------------------------------------------------------------------------------- Hypothyroid, follow-up  Lab Results  Component Value Date   TSH 2.530 12/25/2020   TSH 2.070 12/31/2019   TSH 3.040 03/07/2019   Wt Readings from Last 3 Encounters:  02/10/21 178 lb (80.7 kg)  10/09/20 179 lb (81.2 kg)  10/02/20 179 lb (81.2 kg)    She was last seen for hypothyroid 4 months ago.  Management since that visit includes no medication changes. She reports good compliance with treatment. She is not having side effects.   Symptoms: No change in energy level No constipation  No diarrhea No heat / cold intolerance  No nervousness No palpitations  No weight changes         Medications: Outpatient Medications Prior to Visit  Medication Sig   allopurinol (ZYLOPRIM) 300 MG tablet TAKE 1 TABLET(300 MG) BY MOUTH DAILY   Ascorbic Acid (VITAMIN C) 500 MG CHEW Chew 2 tablets by mouth daily.   Biotin 5 MG TABS  Take 1 tablet by mouth daily.   aspirin EC 81 MG tablet Take 1 tablet by mouth daily. (Patient not taking: No sig reported)   Cholecalciferol (VITAMIN D-1000 MAX ST) 25 MCG (1000 UT) tablet Take 1 tablet by mouth daily.   clobetasol (OLUX) 0.05 % topical foam Apply topically 2 (two) times daily. apply twice daily as needed to affected areas for two weeks then apply twice daily on weekends only. Avoid applying to face, groin, and axilla. Use as directed.  Risk of skin atrophy with long-term use reviewed.   gabapentin (NEURONTIN) 100 MG capsule TAKE 1 CAPSULE(100 MG) BY MOUTH AT BEDTIME   gentamicin ointment (GARAMYCIN) 0.1 % Apply 1 application topically 2 (two) times daily.   levothyroxine (SYNTHROID) 75 MCG tablet TAKE 1 TABLET(75 MCG) BY MOUTH DAILY- MUST HAVE PRIOR ORDERED LABS DRAWN PRIOR TO FURTHER REFILLS   Multiple Vitamins-Minerals (ZINC PO) Take by mouth.   mupirocin ointment (BACTROBAN) 2 % Apply 1 application topically daily.   Naproxen Sodium 220 MG CAPS Take 1 tablet by mouth at bedtime as needed.   omeprazole (PRILOSEC) 20 MG capsule Take 1 capsule (20 mg total) by mouth daily.   pseudoephedrine (SUDAFED) 30 MG tablet Take 15 mg by mouth in the morning and at bedtime.   simvastatin (ZOCOR) 40 MG tablet TAKE 1 TABLET(40 MG) BY MOUTH DAILY   triamcinolone cream (KENALOG) 0.1 % Apply 1 application topically 2 (two) times daily.   triamterene-hydrochlorothiazide (DYAZIDE) 37.5-25 MG capsule TAKE 1 CAPSULE BY MOUTH EVERY DAY   Zinc 30 MG TABS Take 1 tablet by mouth daily.   zolpidem (AMBIEN) 5 MG tablet TAKE 1 TABLET(5 MG) BY MOUTH AT BEDTIME AS NEEDED   No facility-administered medications prior to visit.    Review of Systems  Constitutional:  Negative for activity change and fatigue.  Respiratory:  Negative for cough and shortness of breath.   Cardiovascular:  Negative for chest pain, palpitations and leg swelling.  Musculoskeletal:  Negative for arthralgias and joint swelling.  Neurological:  Negative for dizziness, light-headedness and headaches.       Objective  -------------------------------------------------------------------------------------------------------------------- BP 106/69   Pulse 76   Temp 98.6 F (37 C)   Resp 16   Ht 5' 4.75" (1.645 m)   Wt 178 lb (80.7 kg)   LMP  (LMP Unknown) Comment: 1980s  BMI 29.85 kg/m  BP Readings from Last 3 Encounters:  02/10/21 106/69  10/09/20 111/73  10/02/20 111/76    Wt Readings from Last 3 Encounters:  02/10/21 178 lb (80.7 kg)  10/09/20 179 lb (81.2 kg)  10/02/20 179 lb (81.2 kg)       Physical Exam Vitals reviewed.  Constitutional:      Appearance: Normal appearance.  HENT:     Head: Normocephalic and atraumatic.     Right Ear: Tympanic membrane, ear canal and external ear normal.     Left Ear: Tympanic membrane, ear canal and external ear normal.     Nose: Nose normal.  Eyes:     General: No scleral icterus.    Conjunctiva/sclera: Conjunctivae normal.  Cardiovascular:     Rate and Rhythm: Normal rate and regular rhythm.     Heart sounds: Normal heart sounds.  Pulmonary:     Effort: Pulmonary effort is normal.     Breath sounds: Normal breath sounds.  Abdominal:     Palpations: Abdomen is soft.     Tenderness: There is no abdominal tenderness. There is no right CVA tenderness or  left CVA tenderness.  Musculoskeletal:     Right hand: Decreased range of motion.     Left hand: Decreased range of motion.  Skin:    General: Skin is warm and dry.  Neurological:     General: No focal deficit present.     Mental Status: She is alert and oriented to person, place, and time.  Psychiatric:        Mood and Affect: Mood normal.        Behavior: Behavior normal.        Thought Content: Thought content normal.        Judgment: Judgment normal.      No results found for any visits on 02/10/21.  Assessment & Plan  ----------------------------------------------------------------------------------------------------------------------  1. Lumbosacral radiculopathy at S1 Refer back to neurosurgery.,  Dr. Kevan Ny.  Continue gabapentin - Ambulatory referral to Neurosurgery  2. Hypertension, unspecified type On Dyazide  3. Other hyperlipidemia On simvastatin  4. Hypothyroidism, unspecified type On Synthroid 25 mcg daily 5.  Osteoarthritis  6.  GERD Stop over-the-counter Aleve No follow-ups on file.      I, [name], MD,  have reviewed all documentation for this visit. The documentation on 02/16/21 for the exam, diagnosis, procedures, and orders are all accurate and complete.    Ellamay Fors Cranford Mon, MD  [facility] [phone] (phone) [phone] (fax)  Ali Molina

## 2021-02-10 ENCOUNTER — Encounter: Payer: Self-pay | Admitting: Family Medicine

## 2021-02-10 ENCOUNTER — Other Ambulatory Visit: Payer: Self-pay

## 2021-02-10 ENCOUNTER — Ambulatory Visit (INDEPENDENT_AMBULATORY_CARE_PROVIDER_SITE_OTHER): Payer: Medicare Other | Admitting: Family Medicine

## 2021-02-10 VITALS — BP 106/69 | HR 76 | Temp 98.6°F | Resp 16 | Ht 64.75 in | Wt 178.0 lb

## 2021-02-10 DIAGNOSIS — E7849 Other hyperlipidemia: Secondary | ICD-10-CM | POA: Diagnosis not present

## 2021-02-10 DIAGNOSIS — I10 Essential (primary) hypertension: Secondary | ICD-10-CM

## 2021-02-10 DIAGNOSIS — E039 Hypothyroidism, unspecified: Secondary | ICD-10-CM

## 2021-02-10 DIAGNOSIS — M5417 Radiculopathy, lumbosacral region: Secondary | ICD-10-CM

## 2021-02-10 DIAGNOSIS — K219 Gastro-esophageal reflux disease without esophagitis: Secondary | ICD-10-CM

## 2021-02-10 DIAGNOSIS — M19042 Primary osteoarthritis, left hand: Secondary | ICD-10-CM

## 2021-02-10 NOTE — Patient Instructions (Signed)
Stop daily Aleve.

## 2021-04-02 ENCOUNTER — Encounter: Payer: Self-pay | Admitting: Physician Assistant

## 2021-04-04 ENCOUNTER — Other Ambulatory Visit: Payer: Self-pay | Admitting: Family Medicine

## 2021-04-04 DIAGNOSIS — G629 Polyneuropathy, unspecified: Secondary | ICD-10-CM

## 2021-04-04 NOTE — Telephone Encounter (Signed)
Requested Prescriptions  Pending Prescriptions Disp Refills  . gabapentin (NEURONTIN) 100 MG capsule [Pharmacy Med Name: GABAPENTIN 100MG  CAPSULES] 90 capsule 1    Sig: TAKE 1 CAPSULE(100 MG) BY MOUTH AT BEDTIME     Neurology: Anticonvulsants - gabapentin Passed - 04/04/2021  3:29 AM      Passed - Valid encounter within last 12 months    Recent Outpatient Visits          1 month ago Lumbosacral radiculopathy at [place] Jerrol Banana., MD   5 months ago Encounter for annual wellness visit (AWV) in Medicare patient   [facility] Jerrol Banana., MD   6 months ago Encounter for annual wellness visit (AWV) in Medicare patient   [facility] Jerrol Banana., MD   9 months ago [place], [place], [place]   1 year ago Hypertension, unspecified type   [facility] Jerrol Banana., MD      Future Appointments            In 3 weeks Laurence Ferrari, [place], MD [place]   In 4 months Jerrol Banana., MD [facility], [place]

## 2021-04-07 DIAGNOSIS — M47816 Spondylosis without myelopathy or radiculopathy, lumbar region: Secondary | ICD-10-CM | POA: Diagnosis not present

## 2021-04-07 DIAGNOSIS — M48061 Spinal stenosis, lumbar region without neurogenic claudication: Secondary | ICD-10-CM | POA: Diagnosis not present

## 2021-04-07 DIAGNOSIS — Z981 Arthrodesis status: Secondary | ICD-10-CM | POA: Diagnosis not present

## 2021-04-07 DIAGNOSIS — M4316 Spondylolisthesis, lumbar region: Secondary | ICD-10-CM | POA: Diagnosis not present

## 2021-04-07 DIAGNOSIS — M5416 Radiculopathy, lumbar region: Secondary | ICD-10-CM | POA: Diagnosis not present

## 2021-04-10 ENCOUNTER — Other Ambulatory Visit: Payer: Self-pay | Admitting: Neurosurgery

## 2021-04-10 DIAGNOSIS — M5416 Radiculopathy, lumbar region: Secondary | ICD-10-CM

## 2021-04-10 DIAGNOSIS — Z981 Arthrodesis status: Secondary | ICD-10-CM

## 2021-04-10 DIAGNOSIS — M4316 Spondylolisthesis, lumbar region: Secondary | ICD-10-CM

## 2021-04-15 ENCOUNTER — Other Ambulatory Visit: Payer: Self-pay | Admitting: Family Medicine

## 2021-04-15 DIAGNOSIS — E7849 Other hyperlipidemia: Secondary | ICD-10-CM

## 2021-04-22 ENCOUNTER — Other Ambulatory Visit: Payer: Self-pay

## 2021-04-22 ENCOUNTER — Ambulatory Visit
Admission: RE | Admit: 2021-04-22 | Discharge: 2021-04-22 | Disposition: A | Discharge status: Home / Self Care | Payer: Medicare Other | Source: Ambulatory Visit | Attending: Neurosurgery | Admitting: Neurosurgery

## 2021-04-22 DIAGNOSIS — Z981 Arthrodesis status: Secondary | ICD-10-CM | POA: Diagnosis not present

## 2021-04-22 DIAGNOSIS — M5416 Radiculopathy, lumbar region: Secondary | ICD-10-CM | POA: Diagnosis not present

## 2021-04-22 DIAGNOSIS — M4316 Spondylolisthesis, lumbar region: Secondary | ICD-10-CM | POA: Insufficient documentation

## 2021-04-22 DIAGNOSIS — M5126 Other intervertebral disc displacement, lumbar region: Secondary | ICD-10-CM | POA: Diagnosis not present

## 2021-04-23 DIAGNOSIS — M5441 Lumbago with sciatica, right side: Secondary | ICD-10-CM | POA: Diagnosis not present

## 2021-04-23 DIAGNOSIS — G8929 Other chronic pain: Secondary | ICD-10-CM | POA: Diagnosis not present

## 2021-04-23 DIAGNOSIS — M5442 Lumbago with sciatica, left side: Secondary | ICD-10-CM | POA: Diagnosis not present

## 2021-04-28 ENCOUNTER — Other Ambulatory Visit: Payer: Self-pay | Admitting: Family Medicine

## 2021-04-29 DIAGNOSIS — M5442 Lumbago with sciatica, left side: Secondary | ICD-10-CM | POA: Diagnosis not present

## 2021-04-29 DIAGNOSIS — M5441 Lumbago with sciatica, right side: Secondary | ICD-10-CM | POA: Diagnosis not present

## 2021-04-29 DIAGNOSIS — G8929 Other chronic pain: Secondary | ICD-10-CM | POA: Diagnosis not present

## 2021-04-30 ENCOUNTER — Other Ambulatory Visit: Payer: Self-pay

## 2021-04-30 ENCOUNTER — Ambulatory Visit: Payer: Medicare Other | Admitting: Dermatology

## 2021-04-30 DIAGNOSIS — L853 Xerosis cutis: Secondary | ICD-10-CM

## 2021-04-30 DIAGNOSIS — L820 Inflamed seborrheic keratosis: Secondary | ICD-10-CM | POA: Diagnosis not present

## 2021-04-30 DIAGNOSIS — L299 Pruritus, unspecified: Secondary | ICD-10-CM

## 2021-04-30 DIAGNOSIS — Z86018 Personal history of other benign neoplasm: Secondary | ICD-10-CM

## 2021-04-30 DIAGNOSIS — L821 Other seborrheic keratosis: Secondary | ICD-10-CM

## 2021-04-30 DIAGNOSIS — L578 Other skin changes due to chronic exposure to nonionizing radiation: Secondary | ICD-10-CM | POA: Diagnosis not present

## 2021-04-30 DIAGNOSIS — L905 Scar conditions and fibrosis of skin: Secondary | ICD-10-CM

## 2021-04-30 NOTE — Progress Notes (Signed)
Follow-Up Visit   Subjective  [name] is a 70 y.o. female who presents for the following: Actinic Keratosis (Patient didn't use the 5FU/Calcipotriene mix, because she didn't want to since she has been dealing with a lot of other issues throughout this past year. Recheck the arms/hands, nose, and upper lip today.), irregular skin lesion (On the R hand - scaly, patient would like checked today. ), and bx proven moderately dysplastic nevus (On the mid upper back - biopsied at previous visit, patient here today for recheck.).  The following portions of the chart were reviewed this encounter and updated as appropriate:   Tobacco  Allergies  Meds  Problems  Med Hx  Surg Hx  Fam Hx      Review of Systems:  No other skin or systemic complaints except as noted in HPI or Assessment and Plan.  Objective  Well appearing patient in no apparent distress; mood and affect are within normal limits.  A focused examination was performed including the arms, hands, and back. Relevant physical exam findings are noted in the Assessment and Plan.  upper mid back Scar with no evidence of recurrence.   R forearm, forehead, back Stuck-on, waxy, tan-brown papules and plaques -- Discussed benign etiology and prognosis.   L med calf Dyspigmented smooth macule or patch.   Mid Back Dry skin.  R base of neck x 1, L side x 2, R flank x 1 (4) Erythematous keratotic or waxy stuck-on papule or plaque.    Assessment & Plan  History of dysplastic nevus upper mid back  Clear. Observe for recurrence. Call clinic for new or changing lesions.  Recommend regular skin exams, daily broad-spectrum spf 30+ sunscreen use, and photoprotection.     Seborrheic keratosis R forearm, forehead, back  Reassured benign age-related growth.  Recommend observation.  Discussed cryotherapy if spot(s) become irritated or inflamed. RTC if any changes.   Scar L med calf  Recommend continuing Serica moisturizing  scar formula cream every night or [employer] brand or Mederma silicone scar sheet every night for the first year after a scar appears to help with scar remodeling if desired. Scars remodel on their own for a full year.  Patient c/o pain at site that varies in severity. Recommend OTC Lidocaine to aa PRN. Discussed with patient that shooting pain she is experiencing is likely due to nerve regrowth in the area. Recommend compression stockings daily or wrapping area to help with edema of lower legs which could be contributing.  Xerosis cutis Mid Back  With pruritis - Recommend OTC Gold Bond itch relief cream daily PRN itch and mild soaps/moisturizers.   Inflamed seborrheic keratosis R base of neck x 1, L side x 2, R flank x 1  Gets caught on clothing -   Destruction of lesion - R base of neck x 1, L side x 2, R flank x 1 Complexity: simple   Destruction method: cryotherapy   Informed consent: discussed and consent obtained   Timeout:  patient name, date of birth, surgical site, and procedure verified Lesion destroyed using liquid nitrogen: Yes   Region frozen until ice ball extended beyond lesion: Yes   Outcome: patient tolerated procedure well with no complications   Post-procedure details: wound care instructions given    Actinic Damage - Severe, confluent actinic changes with pre-cancerous actinic keratoses  - Severe, chronic, not at goal, secondary to cumulative UV radiation exposure over time - diffuse scaly erythematous macules and papules with underlying dyspigmentation -  Discussed Prescription "Field Treatment" for Severe, Chronic Confluent Actinic Changes with Pre-Cancerous Actinic Keratoses Field treatment involves treatment of an entire area of skin that has confluent Actinic Changes (Sun/ Ultraviolet light damage) and PreCancerous Actinic Keratoses by method of PhotoDynamic Therapy (PDT) and/or prescription Topical Chemotherapy agents such as 5-fluorouracil,  5-fluorouracil/calcipotriene, and/or imiquimod.  The purpose is to decrease the number of clinically evident and subclinical PreCancerous lesions to prevent progression to development of skin cancer by chemically destroying early precancer changes that may or may not be visible.  It has been shown to reduce the risk of developing skin cancer in the treated area. As a result of treatment, redness, scaling, crusting, and open sores may occur during treatment course. One or more than one of these methods may be used and may have to be used several times to control, suppress and eliminate the PreCancerous changes. Discussed treatment course, expected reaction, and possible side effects. - Recommend daily broad spectrum sunscreen SPF 30+ to sun-exposed areas, reapply every 2 hours as needed.  - Staying in the shade or wearing long sleeves, sun glasses (UVA+UVB protection) and wide brim hats (4-inch brim around the entire circumference of the hat) are also recommended. - Call for new or changing lesions. -Treat right hand, right arm up to elbow, left hand, and left arm up to elbow with 5Fu/Calcipotriene cream BID for up to 7 days. Apply to nose and upper lip BID for up to 4 days. Pt has. May repeat one month after treatment. Reviewed expected reaction of redness, crusting and irritation. Recommend treating one arm at a time and letting it heal before treating the other arm.   Return in about 5 months (around 09/28/2021) for [place] f/u with Dr. Nicole Kindred.  Luther Redo, CMA, am acting as scribe for Forest Gleason, MD .  Documentation: I have reviewed the above documentation for accuracy and completeness, and I agree with the above.  Forest Gleason, MD

## 2021-04-30 NOTE — Patient Instructions (Addendum)
Gentle Skin Care Guide  1. Bathe no more than once a day.  2. Avoid bathing in hot water  3. Use a mild soap like Dove, Vanicream, Cetaphil, CeraVe. Can use Lever 2000 or Cetaphil antibacterial soap  4. Use soap only where you need it. On most days, use it under your arms, between your legs, and on your feet. Let the water rinse other areas unless visibly dirty.  5. When you get out of the bath/shower, use a towel to gently blot your skin dry, don't rub it.  6. While your skin is still a little damp, apply a moisturizing cream such as Vanicream, CeraVe, Cetaphil, Eucerin, Sarna lotion or plain Vaseline Jelly. For hands apply Neutrogena Norwegian Hand Cream or Excipial Hand Cream.  7. Reapply moisturizer any time you start to itch or feel dry.  8. Sometimes using free and clear laundry detergents can be helpful. Fabric softener sheets should be avoided. Downy Free & Gentle liquid, or any liquid fabric softener that is free of dyes and perfumes, it acceptable to use  9. If your doctor has given you prescription creams you may apply moisturizers over them      If you have any questions or concerns for your doctor, please call our main line at [phone] and press option 4 to reach your doctor's medical assistant. If no one answers, please leave a voicemail as directed and we will return your call as soon as possible. Messages left after 4 pm will be answered the following business day.   You may also send us a message via MyChart. We typically respond to MyChart messages within 1-2 business days.  For prescription refills, please ask your pharmacy to contact our office. Our fax number is [phone].  If you have an urgent issue when the clinic is closed that cannot wait until the next business day, you can page your doctor at the number below.    Please note that while we do our best to be available for urgent issues outside of office hours, we are not available 24/7.   If you have  an urgent issue and are unable to reach us, you may choose to seek medical care at your doctor's office, retail clinic, urgent care center, or emergency room.  If you have a medical emergency, please immediately call 911 or go to the emergency department.  Pager Numbers  - Dr. Kowalski: [phone]  - Dr. Moye: [phone]  - Dr. Stewart: [phone]  In the event of inclement weather, please call our main line at [phone] for an update on the status of any delays or closures.  Dermatology Medication Tips: Please keep the boxes that topical medications come in in order to help keep track of the instructions about where and how to use these. Pharmacies typically print the medication instructions only on the boxes and not directly on the medication tubes.   If your medication is too expensive, please contact our office at [phone] option 4 or send us a message through MyChart.   We are unable to tell what your co-pay for medications will be in advance as this is different depending on your insurance coverage. However, we may be able to find a substitute medication at lower cost or fill out paperwork to get insurance to cover a needed medication.   If a prior authorization is required to get your medication covered by your insurance company, please allow us 1-2 business days to complete this process.  Drug prices often vary   depending on where the prescription is filled and some pharmacies may offer cheaper prices.  The website www.goodrx.com contains coupons for medications through different pharmacies. The prices here do not account for what the cost may be with help from insurance (it may be cheaper with your insurance), but the website can give you the price if you did not use any insurance.  - You can print the associated coupon and take it with your prescription to the pharmacy.  - You may also stop by our office during regular business hours and pick up a GoodRx coupon card.   - If you need your prescription sent electronically to a different pharmacy, notify our office through [place] MyChart or by phone at [phone] option 4.  

## 2021-05-05 ENCOUNTER — Encounter: Payer: Self-pay | Admitting: Dermatology

## 2021-05-25 ENCOUNTER — Other Ambulatory Visit: Payer: Self-pay

## 2021-05-25 DIAGNOSIS — M5136 Other intervertebral disc degeneration, lumbar region: Secondary | ICD-10-CM | POA: Diagnosis not present

## 2021-05-25 DIAGNOSIS — M48061 Spinal stenosis, lumbar region without neurogenic claudication: Secondary | ICD-10-CM | POA: Diagnosis not present

## 2021-05-25 DIAGNOSIS — M5416 Radiculopathy, lumbar region: Secondary | ICD-10-CM | POA: Diagnosis not present

## 2021-05-25 MED ORDER — ZOLPIDEM TARTRATE 5 MG PO TABS: 5.0000 mg | ORAL_TABLET | Freq: Every evening | ORAL | 3 refills | Status: DC | PRN

## 2021-05-25 NOTE — Telephone Encounter (Signed)
Pharmacy requesting refill of Zolpidem 5 MG qty 30

## 2021-05-25 NOTE — Telephone Encounter (Signed)
LOV: 02/10/2021  NOV: 08/13/2021  Last Refill: 12/12/2020 #30 1 Refill

## 2021-06-10 DIAGNOSIS — M5416 Radiculopathy, lumbar region: Secondary | ICD-10-CM | POA: Diagnosis not present

## 2021-06-10 DIAGNOSIS — M48062 Spinal stenosis, lumbar region with neurogenic claudication: Secondary | ICD-10-CM | POA: Diagnosis not present

## 2021-06-24 ENCOUNTER — Ambulatory Visit: Payer: Self-pay | Admitting: *Deleted

## 2021-06-24 ENCOUNTER — Telehealth: Payer: Medicare Other | Admitting: Nurse Practitioner

## 2021-06-24 DIAGNOSIS — J189 Pneumonia, unspecified organism: Secondary | ICD-10-CM | POA: Diagnosis not present

## 2021-06-24 DIAGNOSIS — R051 Acute cough: Secondary | ICD-10-CM | POA: Diagnosis not present

## 2021-06-24 MED ORDER — BENZONATATE 100 MG PO CAPS: 100.0000 mg | ORAL_CAPSULE | Freq: Three times a day (TID) | ORAL | 0 refills | Status: DC | PRN

## 2021-06-24 MED ORDER — AZITHROMYCIN 250 MG PO TABS: ORAL_TABLET | ORAL | 0 refills | Status: AC

## 2021-06-24 MED ORDER — PREDNISONE 10 MG PO TABS: 10.0000 mg | ORAL_TABLET | Freq: Two times a day (BID) | ORAL | 0 refills | Status: AC

## 2021-06-24 NOTE — Progress Notes (Signed)
We are sorry that you are not feeling well.  Here is how we plan to help!  Based on your presentation I believe you most likely have A cough due to bacteria.  When patients have a fever and a productive cough with a change in color or increased sputum production, we are concerned about bacterial bronchitis.  If left untreated it can progress to pneumonia.  If your symptoms do not improve with your treatment plan it is important that you contact your provider.   I have prescribed Azithromyin 250 mg: two tablets now and then one tablet daily for 4 additonal days    In addition you may use A prescription cough medication called Tessalon Perles 100mg . You may take 1-2 capsules every 8 hours as needed for your cough.  Prednisone 10 mg twice daily for 5 days please take this with food   From your responses in the eVisit questionnaire you describe inflammation in the upper respiratory tract which is causing a significant cough.  This is commonly called Bronchitis and has four common causes:   Allergies Viral Infections Acid Reflux Bacterial Infection Allergies, viruses and acid reflux are treated by controlling symptoms or eliminating the cause. An example might be a cough caused by taking certain blood pressure medications. You stop the cough by changing the medication. Another example might be a cough caused by acid reflux. Controlling the reflux helps control the cough.     HOME CARE Only take medications as instructed by your medical team. Complete the entire course of an antibiotic. Drink plenty of fluids and get plenty of rest. Avoid close contacts especially the very young and the elderly Cover your mouth if you cough or cough into your sleeve. Always remember to wash your hands A steam or ultrasonic humidifier can help congestion.   GET HELP RIGHT AWAY IF: You develop worsening fever. You become short of breath You cough up blood. Your symptoms persist after you have completed your  treatment plan MAKE SURE YOU  Understand these instructions. Will watch your condition. Will get help right away if you are not doing well or get worse.    Thank you for choosing an e-visit.  Your e-visit answers were reviewed by a board certified advanced clinical practitioner to complete your personal care plan. Depending upon the condition, your plan could have included both over the counter or prescription medications.  Please review your pharmacy choice. Make sure the pharmacy is open so you can pick up prescription now. If there is a problem, you may contact your provider through [employer] and have the prescription routed to another pharmacy.  Your safety is important to [place]. If you have drug allergies check your prescription carefully.   For the next 24 hours you can use MyChart to ask questions about today's visit, request a non-urgent call back, or ask for a work or school excuse. You will get an email in the next two days asking about your experience. I hope that your e-visit has been valuable and will speed your recovery.   I spent approximately 7 minutes reviewing the patient's history, current symptoms and coordinating their plan of care today.    Meds ordered this encounter  Medications   benzonatate (TESSALON) 100 MG capsule    Sig: Take 1 capsule (100 mg total) by mouth 3 (three) times daily as needed for cough.    Dispense:  30 capsule    Refill:  0   azithromycin (ZITHROMAX) 250 MG tablet  Sig: Take 2 tablets on day 1, then 1 tablet daily on days 2 through 5    Dispense:  6 tablet    Refill:  0   predniSONE (DELTASONE) 10 MG tablet    Sig: Take 1 tablet (10 mg total) by mouth 2 (two) times daily with a meal for 5 days.    Dispense:  10 tablet    Refill:  0

## 2021-06-24 NOTE — Telephone Encounter (Signed)
°  Chief Complaint: cough Symptoms: cough, productive, greenish, severe, keeping her awake at night Frequency: Onset 1 week ago Pertinent Negatives: Patient denies fever, CP,sinus pain Disposition: [] ED /[] Urgent Care (no appt availability in office) / [] Appointment(In office/virtual)/ [x]  [facility]/ [] Home Care/ [] Refused Recommended Disposition /[] [facility]/ []  Follow-up with PCP Additional Notes: No availability, advised E Visit, angry affect. Called practice [facility], advised [facility]. Pt frustrated, angry "I can never get seen there. We will find another provider." Assured pt NT would route to practice for review of request of [facility].  Requesting a call back from practice. Please advise: [phone]  Thank you.

## 2021-06-24 NOTE — Telephone Encounter (Signed)
Patient advised as below. Patient requesting visit to be done today. I advised that schedule was full for today and tomorrow. I did give information on virtual visit through [facility]. Patient reports she would do that tonight.

## 2021-06-24 NOTE — Telephone Encounter (Signed)
Pt stated she has had a head cold for a week and a really bad cough ( green mucus). Pt is requesting medication a "Z-pack."  Pt does not want to come in for an appointment stated her husband has cancer she does not have the energy or want to expose her husband.   Seeking clinical advice.           Reason for Disposition  [1] Continuous (nonstop) coughing interferes with work or school AND [2] no improvement using cough treatment per Care Advice  Answer Assessment - Initial Assessment Questions 1. ONSET: "When did the cough begin?"      1 week  2. SEVERITY: "How bad is the cough today?"      Spells during day, keeping awake at night 3. SPUTUM: "Describe the color of your sputum" (none, dry cough; clear, white, yellow, green)     Greenish phlegm. 4. HEMOPTYSIS: "Are you coughing up any blood?" If so ask: "How much?" (flecks, streaks, tablespoons, etc.)     no 5. DIFFICULTY BREATHING: "Are you having difficulty breathing?" If Yes, ask: "How bad is it?" (e.g., mild, moderate, severe)    - MILD: No SOB at rest, mild SOB with walking, speaks normally in sentences, can lie down, no retractions, pulse < 100.    - MODERATE: SOB at rest, SOB with minimal exertion and prefers to sit, cannot lie down flat, speaks in phrases, mild retractions, audible wheezing, pulse 100-120.    - SEVERE: Very SOB at rest, speaks in single words, struggling to breathe, sitting hunched forward, retractions, pulse > 120      SOB with coughing 6. FEVER: "Do you have a fever?" If Yes, ask: "What is your temperature, how was it measured, and when did it start?"     no 7. CARDIAC HISTORY: "Do you have any history of heart disease?" (e.g., heart attack, congestive heart failure)       8. LUNG HISTORY: "Do you have any history of lung disease?"  (e.g., pulmonary embolus, asthma, emphysema)      9. PE RISK FACTORS: "Do you have a history of blood clots?" (or: recent major surgery, recent prolonged travel, bedridden)       10. OTHER SYMPTOMS: "Do you have any other symptoms?" (e.g., runny nose, wheezing, chest pain)       No  Protocols used: Cough - Acute Productive-A-AH

## 2021-06-25 DIAGNOSIS — M4316 Spondylolisthesis, lumbar region: Secondary | ICD-10-CM | POA: Diagnosis not present

## 2021-06-25 DIAGNOSIS — M5416 Radiculopathy, lumbar region: Secondary | ICD-10-CM | POA: Diagnosis not present

## 2021-06-25 DIAGNOSIS — M5136 Other intervertebral disc degeneration, lumbar region: Secondary | ICD-10-CM | POA: Diagnosis not present

## 2021-06-30 DIAGNOSIS — M48062 Spinal stenosis, lumbar region with neurogenic claudication: Secondary | ICD-10-CM | POA: Diagnosis not present

## 2021-06-30 DIAGNOSIS — M5416 Radiculopathy, lumbar region: Secondary | ICD-10-CM | POA: Diagnosis not present

## 2021-06-30 DIAGNOSIS — M5136 Other intervertebral disc degeneration, lumbar region: Secondary | ICD-10-CM | POA: Diagnosis not present

## 2021-07-22 ENCOUNTER — Other Ambulatory Visit: Payer: Self-pay | Admitting: Family Medicine

## 2021-07-22 NOTE — Telephone Encounter (Signed)
Requested medication (s) are due for refill today: yes  Requested medication (s) are on the active medication list: yes  Last refill:  01/14/21 #90/1  Future visit scheduled: yes  Notes to clinic:  Unable to refill per protocol due to failed labs, no updated results.      Requested Prescriptions  Pending Prescriptions Disp Refills   allopurinol (ZYLOPRIM) 300 MG tablet [Pharmacy Med Name: ALLOPURINOL 300MG  TABLETS] 90 tablet 1    Sig: TAKE 1 TABLET(300 MG) BY MOUTH DAILY     Endocrinology:  Gout Agents - allopurinol Failed - 07/22/2021  9:22 AM      Failed - Uric Acid in normal range and within 360 days    Uric Acid  Date Value Ref Range Status  12/31/2019 5.8 3.0 - 7.2 mg/dL Final    Comment:               Therapeutic target for gout patients: <6.0          Failed - Cr in normal range and within 360 days    Creatinine, Ser  Date Value Ref Range Status  12/25/2020 1.08 (H) 0.57 - 1.00 mg/dL Final          Passed - Valid encounter within last 12 months    Recent Outpatient Visits           5 months ago Lumbosacral radiculopathy at [place] Jerrol Banana., MD   9 months ago Encounter for annual wellness visit (AWV) in Medicare patient   [facility] Rosanna Randy, Retia Passe., MD   9 months ago Encounter for annual wellness visit (AWV) in Medicare patient   [place], Retia Passe., MD   1 year ago [place], [place], [place]   1 year ago Hypertension, unspecified type   [facility] Jerrol Banana., MD       Future Appointments             In 3 weeks Jerrol Banana., MD [facility], [place]   In 2 months Brendolyn Patty, MD [place] within normal limits and completed in the last 12 months    WBC  Date Value Ref Range Status  12/25/2020 7.8 3.4 - 10.8 x10E3/uL Final   RBC  Date  Value Ref Range Status  12/25/2020 4.53 3.77 - 5.28 x10E6/uL Final   Hemoglobin  Date Value Ref Range Status  12/25/2020 13.8 11.1 - 15.9 g/dL Final   Hematocrit  Date Value Ref Range Status  12/25/2020 41.1 34.0 - 46.6 % Final   MCHC  Date Value Ref Range Status  12/25/2020 33.6 31.5 - 35.7 g/dL Final   [facility]  Date Value Ref Range Status  12/25/2020 30.5 26.6 - 33.0 pg Final   MCV  Date Value Ref Range Status  12/25/2020 91 79 - 97 fL Final   No results found for: PLTCOUNTKUC, LABPLAT, POCPLA RDW  Date Value Ref Range Status  12/25/2020 13.0 11.7 - 15.4 % Final

## 2021-07-31 ENCOUNTER — Other Ambulatory Visit: Payer: Self-pay | Admitting: Family Medicine

## 2021-07-31 NOTE — Telephone Encounter (Signed)
Refilled 04/28/2021 #90 with 3 refills. Requested Prescriptions  Refused Prescriptions Disp Refills   levothyroxine (SYNTHROID) 75 MCG tablet [Pharmacy Med Name: LEVOTHYROXINE 0.075MG  (75MCG) TABS] 30 tablet     Sig: TAKE 1 TABLET(75 MCG) BY MOUTH DAILY     Endocrinology:  Hypothyroid Agents Passed - 07/31/2021  3:29 AM      Passed - TSH in normal range and within 360 days    TSH  Date Value Ref Range Status  12/25/2020 2.530 0.450 - 4.500 uIU/mL Final         Passed - Valid encounter within last 12 months    Recent Outpatient Visits          5 months ago Lumbosacral radiculopathy at S1   [facility] Jerrol Banana., MD   9 months ago Encounter for annual wellness visit (AWV) in Medicare patient   [facility] Rosanna Randy, Retia Passe., MD   10 months ago Encounter for annual wellness visit (AWV) in Medicare patient   [facility] Jerrol Banana., MD   1 year ago [place], [place], [place]   1 year ago Hypertension, unspecified type   [facility] Jerrol Banana., MD      Future Appointments            In 1 week Jerrol Banana., MD [facility], [place]   In 2 months Brendolyn Patty, MD [place]

## 2021-08-13 ENCOUNTER — Ambulatory Visit (INDEPENDENT_AMBULATORY_CARE_PROVIDER_SITE_OTHER): Payer: Medicare Other | Admitting: Family Medicine

## 2021-08-13 ENCOUNTER — Other Ambulatory Visit: Payer: Self-pay

## 2021-08-13 ENCOUNTER — Encounter: Payer: Self-pay | Admitting: Family Medicine

## 2021-08-13 VITALS — BP 115/74 | HR 76 | Temp 98.0°F | Resp 16 | Ht 64.75 in | Wt 176.0 lb

## 2021-08-13 DIAGNOSIS — M5417 Radiculopathy, lumbosacral region: Secondary | ICD-10-CM

## 2021-08-13 DIAGNOSIS — E7849 Other hyperlipidemia: Secondary | ICD-10-CM

## 2021-08-13 DIAGNOSIS — J309 Allergic rhinitis, unspecified: Secondary | ICD-10-CM

## 2021-08-13 DIAGNOSIS — K219 Gastro-esophageal reflux disease without esophagitis: Secondary | ICD-10-CM | POA: Diagnosis not present

## 2021-08-13 DIAGNOSIS — I10 Essential (primary) hypertension: Secondary | ICD-10-CM

## 2021-08-13 DIAGNOSIS — E039 Hypothyroidism, unspecified: Secondary | ICD-10-CM | POA: Diagnosis not present

## 2021-08-13 MED ORDER — FLUTICASONE PROPIONATE 50 MCG/ACT NA SUSP: 2.0000 | Freq: Every day | NASAL | 6 refills | Status: DC

## 2021-08-13 NOTE — Progress Notes (Signed)
Established patient visit  I,[name],acting as a scribe for [name] Durie, MD.,have documented all relevant documentation on the behalf of [name] Durie, MD,as directed by  [name] Durie, MD while in the presence of [name] Durie, MD.   Patient: [name]   DOB: 67/11/1948   71 y.o. Female  MRN: [id number] Visit Date: 08/13/2021  Today's healthcare provider: [name], MD   Chief Complaint  Patient presents with   Follow-up   Hypertension   Hyperlipidemia   Hypothyroidism   Subjective    HPI  Patient is very frustrated with her chronic back pain.  She is looking at a possible surgery for this.  She has had back surgery in the past. Her leg wound is finally healed.  She does not like the scar tissue but it has healed and is no longer open and draining. Hypertension, follow-up  BP Readings from Last 3 Encounters:  08/13/21 115/74  02/10/21 106/69  10/09/20 111/73   Wt Readings from Last 3 Encounters:  08/13/21 176 lb (79.8 kg)  02/10/21 178 lb (80.7 kg)  10/09/20 179 lb (81.2 kg)     She was last seen for hypertension 6 months ago.  BP at that visit was 106/69. Management since that visit includes; On Dyazide. She reports good compliance with treatment. She is not having side effects. none She is not exercising. She is not adherent to low salt diet.   Outside blood pressures are not checking.  She does not smoke.  Use of agents associated with hypertension: NSAIDS.   --------------------------------------------------------------------------------------------------- Lipid/Cholesterol, follow-up  Last Lipid Panel: Lab Results  Component Value Date   CHOL 189 12/25/2020   LDLCALC 100 (H) 12/25/2020   HDL 43 12/25/2020   TRIG 274 (H) 12/25/2020    She was last seen for this 6 months ago.  Management since that visit includes; On simvastatin.  She reports good compliance with treatment. She is not having side  effects. none  She is following a Regular diet. Current exercise: none  Last metabolic panel Lab Results  Component Value Date   GLUCOSE 102 (H) 12/25/2020   NA 141 12/25/2020   K 3.5 12/25/2020   BUN 25 12/25/2020   CREATININE 1.08 (H) 12/25/2020   EGFR 56 (L) 12/25/2020   GFRNONAA 59 (L) 12/31/2019   CALCIUM 9.9 12/25/2020   AST 24 12/25/2020   ALT 27 12/25/2020   The 10-year ASCVD risk score (Arnett DK, et al., 2019) is: 10.6%  --------------------------------------------------------------------------------------------------- Hypothyroid, follow-up  Lab Results  Component Value Date   TSH 2.530 12/25/2020   TSH 2.070 12/31/2019   TSH 3.040 03/07/2019    Wt Readings from Last 3 Encounters:  08/13/21 176 lb (79.8 kg)  02/10/21 178 lb (80.7 kg)  10/09/20 179 lb (81.2 kg)    She was last seen for hypothyroid 6 months ago.  Management since that visit includes; On Synthroid 25 mcg daily. She reports good compliance with treatment. She is not having side effects. none  -----------------------------------------------------------------------------------------   Medications: Outpatient Medications Prior to Visit  Medication Sig   allopurinol (ZYLOPRIM) 300 MG tablet TAKE 1 TABLET(300 MG) BY MOUTH DAILY   Ascorbic Acid (VITAMIN C) 500 MG CHEW Chew 2 tablets by mouth daily.   benzonatate (TESSALON) 100 MG capsule Take 1 capsule (100 mg total) by mouth 3 (three) times daily as needed for cough.   Biotin 5 MG TABS Take 1 tablet by mouth daily.  Cholecalciferol (VITAMIN D-1000 MAX ST) 25 MCG (1000 UT) tablet Take 1 tablet by mouth daily.   gabapentin (NEURONTIN) 100 MG capsule TAKE 1 CAPSULE(100 MG) BY MOUTH AT BEDTIME   levothyroxine (SYNTHROID) 75 MCG tablet TAKE 1 TABLET(75 MCG) BY MOUTH DAILY MUST HAVE PRIOR ORDERED LABS DRAWN PRIOR TO FURTHER REFILLS   Multiple Vitamins-Minerals (ZINC PO) Take by mouth.   Naproxen Sodium 220 MG CAPS Take 1 tablet by mouth at bedtime  as needed.   omeprazole (PRILOSEC) 20 MG capsule Take 1 capsule (20 mg total) by mouth daily.   pseudoephedrine (SUDAFED) 30 MG tablet Take 15 mg by mouth in the morning and at bedtime.   simvastatin (ZOCOR) 40 MG tablet TAKE 1 TABLET(40 MG) BY MOUTH DAILY   triamterene-hydrochlorothiazide (DYAZIDE) 37.5-25 MG capsule TAKE 1 CAPSULE BY MOUTH EVERY DAY   Zinc 30 MG TABS Take 1 tablet by mouth daily.   zolpidem (AMBIEN) 5 MG tablet Take 1 tablet (5 mg total) by mouth at bedtime as needed for sleep.   [DISCONTINUED] aspirin EC 81 MG tablet Take 1 tablet by mouth daily. (Patient not taking: No sig reported)   [DISCONTINUED] clobetasol (OLUX) 0.05 % topical foam Apply topically 2 (two) times daily. apply twice daily as needed to affected areas for two weeks then apply twice daily on weekends only. Avoid applying to face, groin, and axilla. Use as directed. Risk of skin atrophy with long-term use reviewed.   [DISCONTINUED] gentamicin ointment (GARAMYCIN) 0.1 % Apply 1 application topically 2 (two) times daily.   [DISCONTINUED] mupirocin ointment (BACTROBAN) 2 % Apply 1 application topically daily. (Patient not taking: Reported on 04/30/2021)   [DISCONTINUED] triamcinolone cream (KENALOG) 0.1 % Apply 1 application topically 2 (two) times daily.   No facility-administered medications prior to visit.    Review of Systems  Constitutional:  Negative for appetite change, chills, fatigue and fever.  Respiratory:  Negative for chest tightness and shortness of breath.   Cardiovascular:  Negative for chest pain and palpitations.  Gastrointestinal:  Negative for abdominal pain, nausea and vomiting.  Neurological:  Negative for dizziness and weakness.      Objective    BP 115/74 (BP Location: Right Arm, Patient Position: Sitting, Cuff Size: Normal)    Pulse 76    Temp 98 F (36.7 C) (Temporal)    Resp 16    Ht 5' 4.75" (1.645 m)    Wt 176 lb (79.8 kg)    LMP  (LMP Unknown) Comment: 1980s   SpO2 94%    BMI  29.51 kg/m  {Show previous vital signs (optional):23777}  Physical Exam Vitals reviewed.  Constitutional:      Appearance: Normal appearance.  HENT:     Head: Normocephalic and atraumatic.     Right Ear: Tympanic membrane, ear canal and external ear normal.     Left Ear: Tympanic membrane, ear canal and external ear normal.     Nose: Nose normal.  Eyes:     General: No scleral icterus.    Conjunctiva/sclera: Conjunctivae normal.  Cardiovascular:     Rate and Rhythm: Normal rate and regular rhythm.     Heart sounds: Normal heart sounds.  Pulmonary:     Effort: Pulmonary effort is normal.     Breath sounds: Normal breath sounds.  Abdominal:     Palpations: Abdomen is soft.     Tenderness: There is no abdominal tenderness. There is no right CVA tenderness or left CVA tenderness.  Musculoskeletal:     Right  hand: Decreased range of motion.     Left hand: Decreased range of motion.  Skin:    General: Skin is warm and dry.  Neurological:     General: No focal deficit present.     Mental Status: She is alert and oriented to person, place, and time.  Psychiatric:        Mood and Affect: Mood normal.        Behavior: Behavior normal.        Thought Content: Thought content normal.        Judgment: Judgment normal.      No results found for any visits on 08/13/21.  Assessment & Plan     1. Other hyperlipidemia  - Lipid panel - TSH - CBC w/Diff/Platelet - Comprehensive Metabolic Panel (CMET)  2. Hypothyroidism, unspecified type  - Lipid panel - TSH - CBC w/Diff/Platelet - Comprehensive Metabolic Panel (CMET)  3. Gastroesophageal reflux disease without esophagitis  - Lipid panel - TSH - CBC w/Diff/Platelet - Comprehensive Metabolic Panel (CMET)  4. Hypertension, unspecified type  - Lipid panel - TSH - CBC w/Diff/Platelet - Comprehensive Metabolic Panel (CMET)  5. Allergic rhinitis, unspecified seasonality, unspecified trigger  - fluticasone (FLONASE) 50  MCG/ACT nasal spray; Place 2 sprays into both nostrils daily.  Dispense: 16 g; Refill: 6 6.  Chronic lumbar radiculopathy Followed by spine surgery and pain clinic  No follow-ups on file.      I, [name] Durie, MD, have reviewed all documentation for this visit. The documentation on 08/17/21 for the exam, diagnosis, procedures, and orders are all accurate and complete.    Pearlie Nies Cranford Mon, MD  [facility] [phone] (phone) [phone] (fax)  [place]

## 2021-08-17 ENCOUNTER — Other Ambulatory Visit: Payer: Self-pay | Admitting: Family Medicine

## 2021-08-17 DIAGNOSIS — E7849 Other hyperlipidemia: Secondary | ICD-10-CM | POA: Diagnosis not present

## 2021-08-17 DIAGNOSIS — E039 Hypothyroidism, unspecified: Secondary | ICD-10-CM | POA: Diagnosis not present

## 2021-08-17 DIAGNOSIS — I10 Essential (primary) hypertension: Secondary | ICD-10-CM | POA: Diagnosis not present

## 2021-08-17 DIAGNOSIS — K219 Gastro-esophageal reflux disease without esophagitis: Secondary | ICD-10-CM | POA: Diagnosis not present

## 2021-08-17 DIAGNOSIS — Z1231 Encounter for screening mammogram for malignant neoplasm of breast: Secondary | ICD-10-CM

## 2021-08-18 LAB — LIPID PANEL
Chol/HDL Ratio: 3.8 ratio (ref 0.0–4.4)
Cholesterol, Total: 169 mg/dL (ref 100–199)
HDL: 44 mg/dL (ref >39)
LDL Chol Calc (NIH): 89 mg/dL (ref 0–99)
Triglycerides: 210 mg/dL — ABNORMAL HIGH (ref 0–149)
VLDL Cholesterol Cal: 36 mg/dL (ref 5–40)

## 2021-08-18 LAB — COMPREHENSIVE METABOLIC PANEL
ALT: 28 IU/L (ref 0–32)
AST: 22 IU/L (ref 0–40)
Albumin/Globulin Ratio: 1.7 (ref 1.2–2.2)
Albumin: 4.2 g/dL (ref 3.8–4.8)
Alkaline Phosphatase: 89 IU/L (ref 44–121)
BUN/Creatinine Ratio: 19 (ref 12–28)
BUN: 18 mg/dL (ref 8–27)
Bilirubin Total: 0.4 mg/dL (ref 0.0–1.2)
CO2: 24 mmol/L (ref 20–29)
Calcium: 9.8 mg/dL (ref 8.7–10.3)
Chloride: 105 mmol/L (ref 96–106)
Creatinine, Ser: 0.95 mg/dL (ref 0.57–1.00)
Globulin, Total: 2.5 g/dL (ref 1.5–4.5)
Glucose: 98 mg/dL (ref 70–99)
Potassium: 3.7 mmol/L (ref 3.5–5.2)
Sodium: 146 mmol/L — ABNORMAL HIGH (ref 134–144)
Total Protein: 6.7 g/dL (ref 6.0–8.5)
eGFR: 64 mL/min/{1.73_m2} (ref >59)

## 2021-08-18 LAB — CBC WITH DIFFERENTIAL/PLATELET
Basophils Absolute: 0.1 10*3/uL (ref 0.0–0.2)
Basos: 1 %
EOS (ABSOLUTE): 0.2 10*3/uL (ref 0.0–0.4)
Eos: 3 %
Hematocrit: 37.2 % (ref 34.0–46.6)
Hemoglobin: 12.8 g/dL (ref 11.1–15.9)
Immature Grans (Abs): 0 10*3/uL (ref 0.0–0.1)
Immature Granulocytes: 1 %
Lymphocytes Absolute: 1.7 10*3/uL (ref 0.7–3.1)
Lymphs: 25 %
MCH: 30.9 pg (ref 26.6–33.0)
MCHC: 34.4 g/dL (ref 31.5–35.7)
MCV: 90 fL (ref 79–97)
Monocytes Absolute: 0.5 10*3/uL (ref 0.1–0.9)
Monocytes: 8 %
Neutrophils Absolute: 4.1 10*3/uL (ref 1.4–7.0)
Neutrophils: 62 %
Platelets: 216 10*3/uL (ref 150–450)
RBC: 4.14 x10E6/uL (ref 3.77–5.28)
RDW: 13.1 % (ref 11.7–15.4)
WBC: 6.6 10*3/uL (ref 3.4–10.8)

## 2021-08-18 LAB — TSH: TSH: 2.49 u[IU]/mL (ref 0.450–4.500)

## 2021-09-19 ENCOUNTER — Other Ambulatory Visit: Payer: Self-pay | Admitting: Family Medicine

## 2021-09-19 DIAGNOSIS — I10 Essential (primary) hypertension: Secondary | ICD-10-CM

## 2021-09-19 DIAGNOSIS — G629 Polyneuropathy, unspecified: Secondary | ICD-10-CM

## 2021-09-21 ENCOUNTER — Ambulatory Visit
Admission: RE | Admit: 2021-09-21 | Discharge: 2021-09-21 | Disposition: A | Discharge status: Home / Self Care | Payer: Medicare Other | Source: Ambulatory Visit | Attending: Family Medicine | Admitting: Family Medicine

## 2021-09-21 DIAGNOSIS — Z1231 Encounter for screening mammogram for malignant neoplasm of breast: Secondary | ICD-10-CM | POA: Insufficient documentation

## 2021-09-21 NOTE — Telephone Encounter (Signed)
Requested Prescriptions  ?Pending Prescriptions Disp Refills  ?? gabapentin (NEURONTIN) 100 MG capsule [Pharmacy Med Name: GABAPENTIN '100MG'$  CAPSULES] 90 capsule 1  ?  Sig: TAKE 1 CAPSULE(100 MG) BY MOUTH AT BEDTIME  ?  ? Neurology: Anticonvulsants - gabapentin Passed - 09/19/2021 10:46 AM  ?  ?  Passed - Cr in normal range and within 360 days  ?  Creatinine, Ser  ?Date Value Ref Range Status  ?08/17/2021 0.95 0.57 - 1.00 mg/dL Final  ?   ?  ?  Passed - Completed PHQ-2 or PHQ-9 in the last 360 days  ?  ?  Passed - Valid encounter within last 12 months  ?  Recent Outpatient Visits   ?      ? 1 month ago Lumbosacral radiculopathy at S1  ? [facility] Jerrol Banana., MD  ? 7 months ago Lumbosacral radiculopathy at S1  ? [facility] Jerrol Banana., MD  ? 11 months ago Encounter for annual wellness visit (AWV) in [place] patient  ? [facility] Jerrol Banana., MD  ? 11 months ago Encounter for annual wellness visit (AWV) in [place] patient  ? [facility] Jerrol Banana., MD  ? 1 year ago COVID-19  ? [place], [place]  ?  ?  ?Future Appointments   ?        ? In 3 weeks Brendolyn Patty, MD [place]  ? In 4 months Jerrol Banana., MD [facility]  ?  ? ?  ?  ?  ?? triamterene-hydrochlorothiazide (DYAZIDE) 37.5-25 MG capsule [Pharmacy Med Name: TRIAMTERENE 37.'5MG'$ / HCTZ '25MG'$  CAPS] 90 capsule 1  ?  Sig: TAKE 1 CAPSULE BY MOUTH EVERY DAY  ?  ? Cardiovascular: Diuretic Combos Failed - 09/19/2021 10:46 AM  ?  ?  Failed - Na in normal range and within 180 days  ?  Sodium  ?Date Value Ref Range Status  ?08/17/2021 146 (H) 134 - 144 mmol/L Final  ?   ?  ?  Passed - K in normal range and within 180 days  ?  Potassium  ?Date Value Ref Range Status  ?08/17/2021 3.7 3.5 - 5.2 mmol/L Final  ?   ?  ?  Passed - Cr in normal range and within 180 days  ?  Creatinine, Ser  ?Date Value  Ref Range Status  ?08/17/2021 0.95 0.57 - 1.00 mg/dL Final  ?   ?  ?  Passed - Last BP in normal range  ?  BP Readings from Last 1 Encounters:  ?08/13/21 115/74  ?   ?  ?  Passed - Valid encounter within last 6 months  ?  Recent Outpatient Visits   ?      ? 1 month ago Lumbosacral radiculopathy at S1  ? [facility] Jerrol Banana., MD  ? 7 months ago Lumbosacral radiculopathy at S1  ? [facility] Jerrol Banana., MD  ? 11 months ago Encounter for annual wellness visit (AWV) in [place] patient  ? [facility] Jerrol Banana., MD  ? 11 months ago Encounter for annual wellness visit (AWV) in [place] patient  ? [facility] Jerrol Banana., MD  ? 1 year ago COVID-19  ? [place], [place]  ?  ?  ?Future Appointments   ?        ?  In 3 weeks Brendolyn Patty, MD [place]  ? In 4 months Jerrol Banana., MD [facility]  ?  ? ?  ?  ?  ? ?

## 2021-10-08 ENCOUNTER — Telehealth: Payer: Self-pay | Admitting: Family Medicine

## 2021-10-08 NOTE — Telephone Encounter (Signed)
Left message for patient to call back and schedule Medicare Annual Wellness Visit (AWV) in office.  ? ?If unable to come into the office for AWV,  please offer to do virtually or by telephone. ? ?Last AWV: 10/09/2020 ? ?Please schedule at anytime with [facility] Health Advisor. ? ?30 minute appointment for Virtual or phone ?45 minute appointment for in office or Initial virtual/phone ? ?Any questions, please contact me at [phone]   ?

## 2021-10-13 ENCOUNTER — Ambulatory Visit: Payer: Medicare Other | Admitting: Dermatology

## 2021-10-13 DIAGNOSIS — L820 Inflamed seborrheic keratosis: Secondary | ICD-10-CM | POA: Diagnosis not present

## 2021-10-13 DIAGNOSIS — L570 Actinic keratosis: Secondary | ICD-10-CM

## 2021-10-13 DIAGNOSIS — L905 Scar conditions and fibrosis of skin: Secondary | ICD-10-CM | POA: Diagnosis not present

## 2021-10-13 DIAGNOSIS — L578 Other skin changes due to chronic exposure to nonionizing radiation: Secondary | ICD-10-CM | POA: Diagnosis not present

## 2021-10-13 DIAGNOSIS — Z85828 Personal history of other malignant neoplasm of skin: Secondary | ICD-10-CM

## 2021-10-13 NOTE — Progress Notes (Signed)
? ?Follow-Up Visit ?  ?Subjective  ?[name] is a 71 y.o. female who presents for the following: Follow-up. ? ?Patient here for 5 month follow-up Aks. She has 5FU/Calcipotriene cream, but has only used on a couple of spots on her hand. She thinks they are smaller. 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  ? ?The following portions of the chart were reviewed this encounter and updated as appropriate:  ?  ?  ? ?Review of Systems:  No other skin or systemic complaints except as noted in HPI or Assessment and Plan. ? ?Objective  ?Well appearing patient in no apparent distress; mood and affect are within normal limits. ? ?A focused examination was performed including face, arms, legs. Relevant physical exam findings are noted in the Assessment and Plan. ? ?R temporal hairline x 1, L upper calf x 1 (2) ?Light pink brown waxy thin papule of the left upper calf; Erythematous stuck-on, waxy papule or plaque of the right temporal hairline ? ?left medial calf ?Dyspigmented smooth depressed plaque with central atrophie blanche  ? ?R hand dorsum x 3, L hand dorsum x 1 (4) ?Pink scaly papules ? ? ? ?Assessment & Plan  ?Actinic Damage ?- chronic, secondary to cumulative UV radiation exposure/sun exposure over time ?- diffuse scaly erythematous macules with underlying dyspigmentation ?- Recommend daily broad spectrum sunscreen SPF 30+ to sun-exposed areas, reapply every 2 hours as needed.  ?- Recommend staying in the shade or wearing long sleeves, sun glasses (UVA+UVB protection) and wide brim hats (4-inch brim around the entire circumference of the hat). ?- Call for new or changing lesions. ? ?History of Squamous Cell Carcinoma of the Skin ?- No evidence of recurrence today of the left medial calf ?- Recommend regular full body skin exams ?- Recommend daily broad spectrum sunscreen SPF 30+ to sun-exposed areas, reapply every 2 hours as needed.  ?- Call if any new or changing lesions  are noted between office visits ? ? ?History of Basal Cell Carcinoma of the Skin ?- No evidence of recurrence today of the left dorsal forearm ?- Recommend regular full body skin exams ?- Recommend daily broad spectrum sunscreen SPF 30+ to sun-exposed areas, reapply every 2 hours as needed.  ?- Call if any new or changing lesions are noted between office visits ? ?Inflamed seborrheic keratosis (2) ?R temporal hairline x 1, L upper calf x 1 ? ?Destruction of lesion - R temporal hairline x 1, L upper calf x 1 ? ?Destruction method: cryotherapy   ?Informed consent: discussed and consent obtained   ?Lesion destroyed using liquid nitrogen: Yes   ?Region frozen until ice ball extended beyond lesion: Yes   ?Outcome: patient tolerated procedure well with no complications   ?Post-procedure details: wound care instructions given   ?Additional details:  Prior to procedure, discussed risks of blister formation, small wound, skin dyspigmentation, or rare scar following cryotherapy. Recommend Vaseline ointment to treated areas while healing. ? ? ?Scar ?left medial calf ? ?S/p SCC excision 05/27/20. Scar clear, no evidence of recurrence.  ? ?Discussed fade cream to dyspigmented area. May consider in future if not improving.  ? ? ? ?Hypertrophic actinic keratosis (4) ?R hand dorsum x 3, L hand dorsum x 1 ? ?Actinic keratoses are precancerous spots that appear secondary to cumulative UV radiation exposure/sun exposure over time. They are chronic with expected duration over 1 year. A portion of actinic keratoses will progress to squamous cell  carcinoma of the skin. It is not possible to reliably predict which spots will progress to skin cancer and so treatment is recommended to prevent development of skin cancer. ? ?Recommend daily broad spectrum sunscreen SPF 30+ to sun-exposed areas, reapply every 2 hours as needed.  ?Recommend staying in the shade or wearing long sleeves, sun glasses (UVA+UVB protection) and wide brim hats (4-inch  brim around the entire circumference of the hat). ?Call for new or changing lesions. ? ?Destruction of lesion - R hand dorsum x 3, L hand dorsum x 1 ? ?Destruction method: cryotherapy   ?Informed consent: discussed and consent obtained   ?Lesion destroyed using liquid nitrogen: Yes   ?Region frozen until ice ball extended beyond lesion: Yes   ?Outcome: patient tolerated procedure well with no complications   ?Post-procedure details: wound care instructions given   ?Additional details:  Prior to procedure, discussed risks of blister formation, small wound, skin dyspigmentation, or rare scar following cryotherapy. Recommend Vaseline ointment to treated areas while healing. ? ? ? ?Return in about 4 months (around 02/12/2022) for TBSE, Hx SCC, Hx BCC. ? ?I, Jamesetta Orleans, CMA, am acting as scribe for Brendolyn Patty, MD . ?Documentation: I have reviewed the above documentation for accuracy and completeness, and I agree with the above. ? ?Brendolyn Patty MD  ? ? ?

## 2021-10-13 NOTE — Patient Instructions (Signed)

## 2021-12-03 ENCOUNTER — Telehealth: Payer: Self-pay | Admitting: Family Medicine

## 2021-12-03 NOTE — Telephone Encounter (Signed)
Copied from [place] [id number]. Topic: Medicare AWV >> Dec 03, 2021  2:49 PM Jae Dire wrote: Reason for CRM:  No answer unable to leave amessage for patient to call back and schedule Medicare Annual Wellness Visit (AWV) in office.   If unable to come into the office for AWV,  please offer to do virtually or by telephone.  Last AWV: 10/09/2020  Please schedule at anytime with [facility] Health Advisor.  30 minute appointment for Virtual or phone 45 minute appointment for in office or Initial virtual/phone  Any questions, please contact me at [phone]

## 2021-12-19 ENCOUNTER — Other Ambulatory Visit: Payer: Self-pay | Admitting: Family Medicine

## 2021-12-19 DIAGNOSIS — G629 Polyneuropathy, unspecified: Secondary | ICD-10-CM

## 2022-01-21 ENCOUNTER — Encounter: Payer: Medicare Other | Admitting: Dermatology

## 2022-01-29 DIAGNOSIS — Z0100 Encounter for examination of eyes and vision without abnormal findings: Secondary | ICD-10-CM | POA: Diagnosis not present

## 2022-01-29 DIAGNOSIS — D3131 Benign neoplasm of right choroid: Secondary | ICD-10-CM | POA: Diagnosis not present

## 2022-02-09 DIAGNOSIS — H524 Presbyopia: Secondary | ICD-10-CM | POA: Diagnosis not present

## 2022-02-09 NOTE — Progress Notes (Signed)
Established patient visit  I,Joseline E Rosas,acting as a scribe for [name], MD.,have documented all relevant documentation on the behalf of [name], MD,as directed by  [name], MD while in the presence of [name], MD.   Patient: [name]   DOB: [birth year]   70 y.o. Female  MRN: [id number] Visit Date: 02/10/2022  Today's healthcare provider: [name], MD   Chief Complaint  Patient presents with   Follow-up HTN   Subjective    HPI  Patient is under a lot of stress as her husband has had very poor health for the past few years and has had multiple hospitalizations for major issues such as bone marrow transplant for B-cell lymphoma and recent knee surgery followed by aortic valve replacement.  He is a miracle that he is alive.  Exhausted from taking care of him. Her main complaint physically today is that she has pain when she lays on her left side which is how she normally sleeps.  She also has pain in the left hip and left groin when she walks.  Hypertension, follow-up  BP Readings from Last 3 Encounters:  02/10/22 120/70  08/13/21 115/74  02/10/21 106/69   Wt Readings from Last 3 Encounters:  02/10/22 165 lb 8 oz (75.1 kg)  08/13/21 176 lb (79.8 kg)  02/10/21 178 lb (80.7 kg)     She was last seen for hypertension 6 months ago.  Management since that visit includes; on triamterene-hydrochlorothiazide 37.5-25 mg.  Outside blood pressures are not being checked.  Pertinent labs Lab Results  Component Value Date   CHOL 169 08/17/2021   HDL 44 08/17/2021   LDLCALC 89 08/17/2021   TRIG 210 (H) 08/17/2021   CHOLHDL 3.8 08/17/2021   Lab Results  Component Value Date   NA 146 (H) 08/17/2021   K 3.7 08/17/2021   CREATININE 0.95 08/17/2021   EGFR 64 08/17/2021   GLUCOSE 98 08/17/2021   TSH 2.490 08/17/2021     The 10-year ASCVD risk score (Arnett DK, et al., 2019) is:  11.1%  --------------------------------------------------------------------------------------------------- Hip Pain: c/o left hip pain for the past several weeks. Reports that it hurts to walk. Tried: nothing, but takes every other day Aleve or IBU for her back pain.  Medications: Outpatient Medications Prior to Visit  Medication Sig   allopurinol (ZYLOPRIM) 300 MG tablet TAKE 1 TABLET(300 MG) BY MOUTH DAILY   Ascorbic Acid (VITAMIN C) 500 MG CHEW Chew 2 tablets by mouth daily.   Cholecalciferol (VITAMIN D-1000 MAX ST) 25 MCG (1000 UT) tablet Take 1 tablet by mouth daily.   gabapentin (NEURONTIN) 100 MG capsule TAKE 1 CAPSULE(100 MG) BY MOUTH AT BEDTIME   levothyroxine (SYNTHROID) 75 MCG tablet TAKE 1 TABLET(75 MCG) BY MOUTH DAILY MUST HAVE PRIOR ORDERED LABS DRAWN PRIOR TO FURTHER REFILLS   Multiple Vitamins-Minerals (ZINC PO) Take by mouth.   Naproxen Sodium 220 MG CAPS Take 1 tablet by mouth at bedtime as needed.   omeprazole (PRILOSEC) 20 MG capsule Take 1 capsule (20 mg total) by mouth daily.   simvastatin (ZOCOR) 40 MG tablet TAKE 1 TABLET(40 MG) BY MOUTH DAILY   triamterene-hydrochlorothiazide (DYAZIDE) 37.5-25 MG capsule TAKE 1 CAPSULE BY MOUTH EVERY DAY   zolpidem (AMBIEN) 5 MG tablet Take 1 tablet (5 mg total) by mouth at bedtime as needed for sleep.   benzonatate (TESSALON) 100 MG capsule Take 1 capsule (100 mg total) by mouth 3 (three) times  daily as needed for cough.   Biotin 5 MG TABS Take 1 tablet by mouth daily.   fluticasone (FLONASE) 50 MCG/ACT nasal spray Place 2 sprays into both nostrils daily.   pseudoephedrine (SUDAFED) 30 MG tablet Take 15 mg by mouth in the morning and at bedtime.   Zinc 30 MG TABS Take 1 tablet by mouth daily.   No facility-administered medications prior to visit.    Review of Systems     Objective    BP 120/70 (BP Location: Right Arm, Patient Position: Sitting, Cuff Size: Normal)   Pulse 81   Temp 98.5 F (36.9 C) (Oral)   Resp 16   Ht  5' 4.5" (1.638 m)   Wt 165 lb 8 oz (75.1 kg)   LMP  (LMP Unknown) Comment: 1980s  SpO2 97%   BMI 27.97 kg/m    Physical Exam Vitals reviewed.  Constitutional:      Appearance: Normal appearance.  HENT:     Head: Normocephalic and atraumatic.     Right Ear: Tympanic membrane, ear canal and external ear normal.     Left Ear: Tympanic membrane, ear canal and external ear normal.     Nose: Nose normal.  Eyes:     General: No scleral icterus.    Conjunctiva/sclera: Conjunctivae normal.  Cardiovascular:     Rate and Rhythm: Normal rate and regular rhythm.     Heart sounds: Normal heart sounds.  Pulmonary:     Effort: Pulmonary effort is normal.     Breath sounds: Normal breath sounds.  Abdominal:     Palpations: Abdomen is soft.     Tenderness: There is no abdominal tenderness. There is no right CVA tenderness or left CVA tenderness.  Musculoskeletal:     Right hand: Decreased range of motion.     Left hand: Decreased range of motion.     Comments: Figure-of-four maneuver is positive on the left.  Skin:    General: Skin is warm and dry.  Neurological:     General: No focal deficit present.     Mental Status: She is alert and oriented to person, place, and time.  Psychiatric:        Mood and Affect: Mood normal.        Behavior: Behavior normal.        Thought Content: Thought content normal.        Judgment: Judgment normal.       No results found for any visits on 02/10/22.  Assessment & Plan     1. Hypertension, unspecified type Good control  2. Left hip pain I think this is probably hip etiology versus lumbosacral  X-ray and refer to orthopedics. - DG Hip Unilat W OR W/O Pelvis 2-3 Views Left - meloxicam (MOBIC) 7.5 MG tablet; Take 1 tablet (7.5 mg total) by mouth 2 (two) times daily as needed for pain.  Dispense: 60 tablet; Refill: 1 - Ambulatory referral to Orthopedic Surgery  3. Lumbosacral radiculopathy at S1   4. Neuropathy   5. Major depressive  disorder with single episode, in partial remission ([place]) Reactive depression with her husband's illness.  Consider referring to [facility] for counseling. No meds at present.  No follow-ups on file.      I, [name], MD, have reviewed all documentation for this visit. The documentation on 02/12/22 for the exam, diagnosis, procedures, and orders are all accurate and complete.    Ed Mandich Cranford Mon, MD  [facility] [phone] (phone)  [phone] (fax)  [place]

## 2022-02-10 ENCOUNTER — Ambulatory Visit
Admission: RE | Admit: 2022-02-10 | Discharge: 2022-02-10 | Disposition: A | Discharge status: Home / Self Care | Payer: Medicare Other | Source: Ambulatory Visit | Attending: Family Medicine | Admitting: Family Medicine

## 2022-02-10 ENCOUNTER — Ambulatory Visit (INDEPENDENT_AMBULATORY_CARE_PROVIDER_SITE_OTHER): Payer: Medicare Other | Admitting: Family Medicine

## 2022-02-10 ENCOUNTER — Ambulatory Visit
Admission: RE | Admit: 2022-02-10 | Discharge: 2022-02-10 | Disposition: A | Discharge status: Home / Self Care | Payer: Medicare Other | Attending: Family Medicine | Admitting: Family Medicine

## 2022-02-10 ENCOUNTER — Encounter: Payer: Self-pay | Admitting: Family Medicine

## 2022-02-10 VITALS — BP 120/70 | HR 81 | Temp 98.5°F | Resp 16 | Ht 64.5 in | Wt 165.5 lb

## 2022-02-10 DIAGNOSIS — G629 Polyneuropathy, unspecified: Secondary | ICD-10-CM

## 2022-02-10 DIAGNOSIS — M25552 Pain in left hip: Secondary | ICD-10-CM

## 2022-02-10 DIAGNOSIS — M5417 Radiculopathy, lumbosacral region: Secondary | ICD-10-CM | POA: Diagnosis not present

## 2022-02-10 DIAGNOSIS — I10 Essential (primary) hypertension: Secondary | ICD-10-CM | POA: Diagnosis not present

## 2022-02-10 DIAGNOSIS — F324 Major depressive disorder, single episode, in partial remission: Secondary | ICD-10-CM

## 2022-02-10 MED ORDER — MELOXICAM 7.5 MG PO TABS: 7.5000 mg | ORAL_TABLET | Freq: Two times a day (BID) | ORAL | 1 refills | Status: DC | PRN

## 2022-02-25 DIAGNOSIS — M7062 Trochanteric bursitis, left hip: Secondary | ICD-10-CM | POA: Diagnosis not present

## 2022-02-25 DIAGNOSIS — M1612 Unilateral primary osteoarthritis, left hip: Secondary | ICD-10-CM | POA: Diagnosis not present

## 2022-03-01 ENCOUNTER — Ambulatory Visit: Payer: Medicare Other | Admitting: Dermatology

## 2022-03-01 DIAGNOSIS — Z85828 Personal history of other malignant neoplasm of skin: Secondary | ICD-10-CM

## 2022-03-01 DIAGNOSIS — L820 Inflamed seborrheic keratosis: Secondary | ICD-10-CM

## 2022-03-01 DIAGNOSIS — L905 Scar conditions and fibrosis of skin: Secondary | ICD-10-CM

## 2022-03-01 DIAGNOSIS — L409 Psoriasis, unspecified: Secondary | ICD-10-CM

## 2022-03-01 DIAGNOSIS — Z85820 Personal history of malignant melanoma of skin: Secondary | ICD-10-CM

## 2022-03-01 DIAGNOSIS — Z1283 Encounter for screening for malignant neoplasm of skin: Secondary | ICD-10-CM | POA: Diagnosis not present

## 2022-03-01 DIAGNOSIS — L814 Other melanin hyperpigmentation: Secondary | ICD-10-CM | POA: Diagnosis not present

## 2022-03-01 DIAGNOSIS — L821 Other seborrheic keratosis: Secondary | ICD-10-CM

## 2022-03-01 DIAGNOSIS — L570 Actinic keratosis: Secondary | ICD-10-CM

## 2022-03-01 DIAGNOSIS — L578 Other skin changes due to chronic exposure to nonionizing radiation: Secondary | ICD-10-CM

## 2022-03-01 MED ORDER — HYDROQUINONE 4 % EX CREA: TOPICAL_CREAM | CUTANEOUS | 1 refills | Status: DC

## 2022-03-01 NOTE — Patient Instructions (Addendum)
Start hydroquinone 4% cream - Apply twice a day to dark spots (scar on left lower leg, left and right foot).  Recommend starting moisturizer with exfoliant (Urea, Salicylic acid, or Lactic acid) one to two times daily to help smooth rough and bumpy skin.  OTC options include Cetaphil Rough and Bumpy lotion (Urea), Eucerin Roughness Relief lotion or spot treatment cream (Urea), CeraVe SA lotion/cream for Rough and Bumpy skin (Sal Acid), Gold Bond Rough and Bumpy cream (Sal Acid), and AmLactin 12% lotion/cream (Lactic Acid).  If applying in morning, also apply sunscreen to sun-exposed areas, since these exfoliating moisturizers can increase sensitivity to sun.  Continue Clobetasol foam once to twice a day to psoriasis on elbows for flares. Avoid face, groin, underarms.  Topical steroids (such as triamcinolone, fluocinolone, fluocinonide, mometasone, clobetasol, halobetasol, betamethasone, hydrocortisone) can cause thinning and lightening of the skin if they are used for too long in the same area. Your physician has selected the right strength medicine for your problem and area affected on the body. Please use your medication only as directed by your physician to prevent side effects.    Cryotherapy Aftercare  Wash gently with soap and water everyday.   Apply Vaseline and Band-Aid daily until healed.   Due to recent changes in healthcare laws, you may see results of your pathology and/or laboratory studies on MyChart before the doctors have had a chance to review them. We understand that in some cases there may be results that are confusing or concerning to you. Please understand that not all results are received at the same time and often the doctors may need to interpret multiple results in order to provide you with the best plan of care or course of treatment. Therefore, we ask that you please give [place] 2 business days to thoroughly review all your results before contacting the office for clarification.  Should we see a critical lab result, you will be contacted sooner.   If You Need Anything After Your Visit  If you have any questions or concerns for your doctor, please call our main line at [phone] and press option 4 to reach your doctor's medical assistant. If no one answers, please leave a voicemail as directed and we will return your call as soon as possible. Messages left after 4 pm will be answered the following business day.   You may also send [place] a message via [employer]. We typically respond to MyChart messages within 1-2 business days.  For prescription refills, please ask your pharmacy to contact our office. Our fax number is [phone].  If you have an urgent issue when the clinic is closed that cannot wait until the next business day, you can page your doctor at the number below.    Please note that while we do our best to be available for urgent issues outside of office hours, we are not available 24/7.   If you have an urgent issue and are unable to reach [place], you may choose to seek medical care at your doctor's office, retail clinic, urgent care center, or emergency room.  If you have a medical emergency, please immediately call 911 or go to the emergency department.  Pager Numbers  - Dr. Nehemiah Massed: [phone]  - Dr. Laurence Ferrari: [phone]  - Dr. Nicole Kindred: [phone]  In the event of inclement weather, please call our main line at [phone] for an update on the status of any delays or closures.  Dermatology Medication Tips: Please keep the boxes that topical medications come  in in order to help keep track of the instructions about where and how to use these. Pharmacies typically print the medication instructions only on the boxes and not directly on the medication tubes.   If your medication is too expensive, please contact our office at [phone] option 4 or send [place] a message through [place].   We are unable to tell what your co-pay for medications will be  in advance as this is different depending on your insurance coverage. However, we may be able to find a substitute medication at lower cost or fill out paperwork to get insurance to cover a needed medication.   If a prior authorization is required to get your medication covered by your insurance company, please allow [place] 1-2 business days to complete this process.  Drug prices often vary depending on where the prescription is filled and some pharmacies may offer cheaper prices.  The website www.goodrx.com contains coupons for medications through different pharmacies. The prices here do not account for what the cost may be with help from insurance (it may be cheaper with your insurance), but the website can give you the price if you did not use any insurance.  - You can print the associated coupon and take it with your prescription to the pharmacy.  - You may also stop by our office during regular business hours and pick up a GoodRx coupon card.  - If you need your prescription sent electronically to a different pharmacy, notify our office through [facility] or by phone at [phone] option 4.     Si Usted Necesita Algo Despus de Su Visita  Tambin puede enviarnos un mensaje a travs de Pharmacist, community. Por lo general respondemos a los mensajes de MyChart en el transcurso de 1 a 2 das hbiles.  Para renovar recetas, por favor pida a su farmacia que se ponga en contacto con nuestra oficina. Harland Dingwall de fax es [place] [phone].  Si tiene un asunto urgente cuando la clnica est cerrada y que no puede esperar hasta el siguiente da hbil, puede llamar/localizar a su doctor(a) al nmero que aparece a continuacin.   Por favor, tenga en cuenta que aunque hacemos todo lo posible para estar disponibles para asuntos urgentes fuera del horario de [place], no estamos disponibles las 24 horas del da, los 7 das de la [place].   Si tiene un problema urgente y no puede comunicarse con nosotros,  puede optar por buscar atencin mdica  en el consultorio de su doctor(a), en una clnica privada, en un centro de atencin urgente o en una sala de emergencias.  Si tiene Engineering geologist, por favor llame inmediatamente al 911 o vaya a la sala de emergencias.  Nmeros de bper  - Dr. Nehemiah Massed: [phone]  - Dra. Moye: [phone]  - Dra. Nicole Kindred: [phone]  En caso de inclemencias del [place], por favor llame a Johnsie Kindred principal al [phone] para una actualizacin sobre el [place] de cualquier retraso o cierre.  Consejos para la medicacin en dermatologa: Por favor, guarde las cajas en las que vienen los medicamentos de uso tpico para ayudarle a seguir las instrucciones sobre dnde y cmo usarlos. Las farmacias generalmente imprimen las instrucciones del medicamento slo en las cajas y no directamente en los tubos del [place].   Si su medicamento es muy caro, por favor, pngase en contacto con Zigmund Daniel llamando al [phone] y presione la opcin 4 o envenos un mensaje a travs de Pharmacist, community.   No podemos decirle cul  ser su copago por los medicamentos por adelantado ya que esto es diferente dependiendo de la cobertura de su seguro. Sin embargo, es posible que podamos encontrar un medicamento sustituto a Electrical engineer un formulario para que el seguro cubra el medicamento que se considera necesario.   Si se requiere una autorizacin previa para que su compaa de seguros [place] su medicamento, por favor permtanos de 1 a 2 das hbiles para completar este proceso.  Los precios de los medicamentos varan con frecuencia dependiendo del Environmental consultant de dnde se surte la receta y alguna farmacias pueden ofrecer precios ms baratos.  El sitio web www.goodrx.com tiene cupones para medicamentos de Airline pilot. Los precios aqu no tienen en cuenta lo que podra costar con la ayuda del seguro (puede ser ms barato con su seguro), pero el sitio web puede darle el  precio si no utiliz Research scientist (physical sciences).  - Puede imprimir el cupn correspondiente y llevarlo con su receta a la farmacia.  - Tambin puede pasar por nuestra oficina durante el horario de atencin regular y Charity fundraiser una tarjeta de cupones de GoodRx.  - Si necesita que su receta se enve electrnicamente a una farmacia diferente, informe a nuestra oficina a travs de MyChart de [place] o por telfono llamando al [phone] y presione la opcin 4.

## 2022-03-01 NOTE — Progress Notes (Signed)
Follow-Up Visit   Subjective  [name] is a 71 y.o. female who presents for the following: Annual Exam.  The patient presents for Total-Body Skin Exam (TBSE) for skin cancer screening and mole check.  The patient has spots, moles and lesions to be evaluated, some may be new or changing. She has a spot on her forehead that she picks at and scaly spots on her hands. Also itchy spots on her back. History of AKs of the hands. She has a history of BCC, SCC, and Dysplastic Nevus.    The following portions of the chart were reviewed this encounter and updated as appropriate:       Review of Systems:  No other skin or systemic complaints except as noted in HPI or Assessment and Plan.  Objective  Well appearing patient in no apparent distress; mood and affect are within normal limits.  A full examination was performed including scalp, head, eyes, ears, nose, lips, neck, chest, axillae, abdomen, back, buttocks, bilateral upper extremities, bilateral lower extremities, hands, feet, fingers, toes, fingernails, and toenails. All findings within normal limits unless otherwise noted below.  forehead x 2, spinal upper back x 1, L upper back x 1, R lower back x 2 (6) Erythematous stuck-on, waxy papule or plaque  Left medial calf Dyspigmented atrohpic plaque  Left foot dorsum; Right dorsal ankle 1.8 x 1.6 cm hyperpigmented patch, slightly lighter central, regular pigment and border of the left foot dorsum; 1.0cm hyperpigmented patch R dorsal ankle         R forearm x 4, L hand x 3, R hand x 1 (8) Pink scaly macules  bilateral elbows Clear today    Assessment & Plan  Skin cancer screening performed today.  Actinic Damage - chronic, secondary to cumulative UV radiation exposure/sun exposure over time - diffuse scaly erythematous macules with underlying dyspigmentation - Recommend daily broad spectrum sunscreen SPF 30+ to sun-exposed areas, reapply every 2 hours as needed.   - Recommend staying in the shade or wearing long sleeves, sun glasses (UVA+UVB protection) and wide brim hats (4-inch brim around the entire circumference of the hat). - Call for new or changing lesions.  History of Melanoma 2016 - No evidence of recurrence today of the chest - Recommend regular full body skin exams - Recommend daily broad spectrum sunscreen SPF 30+ to sun-exposed areas, reapply every 2 hours as needed.  - Call if any new or changing lesions are noted between office visits  History of Squamous Cell Carcinoma of the Skin - No evidence of recurrence today of the left medial calf - Recommend regular full body skin exams - Recommend daily broad spectrum sunscreen SPF 30+ to sun-exposed areas, reapply every 2 hours as needed.  - Call if any new or changing lesions are noted between office visits   History of Basal Cell Carcinoma of the Skin - No evidence of recurrence today of the left forearm - Recommend regular full body skin exams - Recommend daily broad spectrum sunscreen SPF 30+ to sun-exposed areas, reapply every 2 hours as needed.  - Call if any new or changing lesions are noted between office visits  Lentigines - Scattered tan macules - Due to sun exposure - Benign-appering, observe - Recommend daily broad spectrum sunscreen SPF 30+ to sun-exposed areas, reapply every 2 hours as needed. - Call for any changes  Seborrheic Keratoses - Stuck-on, waxy, tan-brown papules and/or plaques  - Benign-appearing - Discussed benign etiology and prognosis. - Observe - Call  for any changes  Inflamed seborrheic keratosis (6) forehead x 2, spinal upper back x 1, L upper back x 1, R lower back x 2  Symptomatic, irritating, patient would like treated.  Destruction of lesion - forehead x 2, spinal upper back x 1, L upper back x 1, R lower back x 2  Destruction method: cryotherapy   Informed consent: discussed and consent obtained   Lesion destroyed using liquid  nitrogen: Yes   Region frozen until ice ball extended beyond lesion: Yes   Outcome: patient tolerated procedure well with no complications   Post-procedure details: wound care instructions given   Additional details:  Prior to procedure, discussed risks of blister formation, small wound, skin dyspigmentation, or rare scar following cryotherapy. Recommend Vaseline ointment to treated areas while healing.   Scar Left medial calf  S/p SCC excision 05/27/20. Scar clear, no evidence of recurrence.    Recommend moisturizer daily. Sample of AmLactin Cream.  Recommend starting moisturizer with exfoliant (Urea, Salicylic acid, or Lactic acid) one to two times daily to help smooth rough and bumpy skin.  OTC options include Cetaphil Rough and Bumpy lotion (Urea), Eucerin Roughness Relief lotion or spot treatment cream (Urea), CeraVe SA lotion/cream for Rough and Bumpy skin (Sal Acid), Gold Bond Rough and Bumpy cream (Sal Acid), and AmLactin 12% lotion/cream (Lactic Acid).  If applying in morning, also apply sunscreen to sun-exposed areas, since these exfoliating moisturizers can increase sensitivity to sun.   Lentigo Left foot dorsum; Right dorsal ankle  Vs SK.  Benign appearing. Observation. Photo today. Recheck on f/u. Do not recommend cryotherapy since she has the tendency to hyperpigment after procedures Start hydroquinone 4% cream Apply to dark spots BID dsp 28.35g 1Rf. Recommend sunscreen if sun-exposed  hydroquinone 4 % cream - Left foot dorsum; Right dorsal ankle Apply to dark spots twice a day until improved.  AK (actinic keratosis) (8) R forearm x 4, L hand x 3, R hand x 1  Actinic keratoses are precancerous spots that appear secondary to cumulative UV radiation exposure/sun exposure over time. They are chronic with expected duration over 1 year. A portion of actinic keratoses will progress to squamous cell carcinoma of the skin. It is not possible to reliably predict which spots will  progress to skin cancer and so treatment is recommended to prevent development of skin cancer.  Recommend daily broad spectrum sunscreen SPF 30+ to sun-exposed areas, reapply every 2 hours as needed.  Recommend staying in the shade or wearing long sleeves, sun glasses (UVA+UVB protection) and wide brim hats (4-inch brim around the entire circumference of the hat). Call for new or changing lesions.  Destruction of lesion - R forearm x 4, L hand x 3, R hand x 1  Destruction method: cryotherapy   Informed consent: discussed and consent obtained   Lesion destroyed using liquid nitrogen: Yes   Region frozen until ice ball extended beyond lesion: Yes   Outcome: patient tolerated procedure well with no complications   Post-procedure details: wound care instructions given   Additional details:  Prior to procedure, discussed risks of blister formation, small wound, skin dyspigmentation, or rare scar following cryotherapy. Recommend Vaseline ointment to treated areas while healing.   Psoriasis bilateral elbows  Chronic condition with duration or expected duration over one year. Currently well-controlled.   Psoriasis is a chronic non-curable, but treatable genetic/hereditary disease that may have other systemic features affecting other organ systems such as joints (Psoriatic Arthritis). It is associated with an increased  risk of inflammatory bowel disease, heart disease, non-alcoholic fatty liver disease, and depression.    Continue Clobetasol Foam qd/bid prn flares. Avoid face, groin, axilla.   Topical steroids (such as triamcinolone, fluocinolone, fluocinonide, mometasone, clobetasol, halobetasol, betamethasone, hydrocortisone) can cause thinning and lightening of the skin if they are used for too long in the same area. Your physician has selected the right strength medicine for your problem and area affected on the body. Please use your medication only as directed by your physician to prevent side  effects.     Return in about 6 months (around 08/30/2022) for Hx AKs, check Lentigo vs SK foot.  IJamesetta Orleans, CMA, am acting as scribe for Brendolyn Patty, MD .  Documentation: I have reviewed the above documentation for accuracy and completeness, and I agree with the above.  Brendolyn Patty MD

## 2022-03-18 ENCOUNTER — Other Ambulatory Visit: Payer: Self-pay | Admitting: Family Medicine

## 2022-03-18 DIAGNOSIS — I10 Essential (primary) hypertension: Secondary | ICD-10-CM

## 2022-03-18 NOTE — Telephone Encounter (Signed)
Requested medications are due for refill today.  yes  Requested medications are on the active medications list.  yes  Last refill. 09/21/2021 #90 1 rf  Future visit scheduled.   no  Notes to clinic.  Dr. Rosanna Randy pt.    Requested Prescriptions  Pending Prescriptions Disp Refills   triamterene-hydrochlorothiazide (DYAZIDE) 37.5-25 MG capsule [Pharmacy Med Name: TRIAMTERENE 37.'5MG'$ / HCTZ '25MG'$  CAPS] 90 capsule 1    Sig: TAKE 1 CAPSULE BY MOUTH EVERY DAY     Cardiovascular: Diuretic Combos Failed - 03/18/2022 11:27 AM      Failed - K in normal range and within 180 days    Potassium  Date Value Ref Range Status  08/17/2021 3.7 3.5 - 5.2 mmol/L Final         Failed - Na in normal range and within 180 days    Sodium  Date Value Ref Range Status  08/17/2021 146 (H) 134 - 144 mmol/L Final         Failed - Cr in normal range and within 180 days    Creatinine, Ser  Date Value Ref Range Status  08/17/2021 0.95 0.57 - 1.00 mg/dL Final         Passed - Last BP in normal range    BP Readings from Last 1 Encounters:  02/10/22 120/70         Passed - Valid encounter within last 6 months    Recent Outpatient Visits           1 month ago Hypertension, unspecified type   [facility] Jerrol Banana., MD   7 months ago Lumbosacral radiculopathy at [place] Jerrol Banana., MD   1 year ago Lumbosacral radiculopathy at [place]   [facility] Jerrol Banana., MD   1 year ago Encounter for annual wellness visit (AWV) in Medicare patient   [facility] Jerrol Banana., MD   1 year ago Encounter for annual wellness visit (AWV) in Medicare patient   [facility] Jerrol Banana., MD       Future Appointments             In 5 months Brendolyn Patty, MD [place]

## 2022-03-22 DIAGNOSIS — M7062 Trochanteric bursitis, left hip: Secondary | ICD-10-CM | POA: Diagnosis not present

## 2022-03-22 DIAGNOSIS — M1612 Unilateral primary osteoarthritis, left hip: Secondary | ICD-10-CM | POA: Diagnosis not present

## 2022-03-22 DIAGNOSIS — M25552 Pain in left hip: Secondary | ICD-10-CM | POA: Diagnosis not present

## 2022-04-12 DIAGNOSIS — M7062 Trochanteric bursitis, left hip: Secondary | ICD-10-CM | POA: Diagnosis not present

## 2022-04-16 DIAGNOSIS — M1612 Unilateral primary osteoarthritis, left hip: Secondary | ICD-10-CM | POA: Diagnosis not present

## 2022-04-16 DIAGNOSIS — M7062 Trochanteric bursitis, left hip: Secondary | ICD-10-CM | POA: Diagnosis not present

## 2022-04-16 DIAGNOSIS — M25552 Pain in left hip: Secondary | ICD-10-CM | POA: Diagnosis not present

## 2022-04-26 ENCOUNTER — Encounter: Payer: Self-pay | Admitting: Physician Assistant

## 2022-04-26 ENCOUNTER — Telehealth: Payer: Self-pay | Admitting: Family Medicine

## 2022-04-26 ENCOUNTER — Ambulatory Visit (INDEPENDENT_AMBULATORY_CARE_PROVIDER_SITE_OTHER): Payer: Medicare Other | Admitting: Physician Assistant

## 2022-04-26 DIAGNOSIS — R0989 Other specified symptoms and signs involving the circulatory and respiratory systems: Secondary | ICD-10-CM | POA: Diagnosis not present

## 2022-04-26 DIAGNOSIS — M1612 Unilateral primary osteoarthritis, left hip: Secondary | ICD-10-CM | POA: Diagnosis not present

## 2022-04-26 DIAGNOSIS — M25552 Pain in left hip: Secondary | ICD-10-CM | POA: Diagnosis not present

## 2022-04-26 DIAGNOSIS — M7062 Trochanteric bursitis, left hip: Secondary | ICD-10-CM | POA: Diagnosis not present

## 2022-04-26 MED ORDER — OMEPRAZOLE 20 MG PO CPDR: 20.0000 mg | DELAYED_RELEASE_CAPSULE | Freq: Every day | ORAL | 3 refills | Status: DC

## 2022-04-26 NOTE — Telephone Encounter (Signed)
[facility] faxed refill request for the following medications:     omeprazole (PRILOSEC) 20 MG capsule    Please advise

## 2022-04-26 NOTE — Progress Notes (Unsigned)
Virtual telephone visit    Virtual Visit via Telephone Note   This visit type was conducted due to national recommendations for restrictions regarding the COVID-19 Pandemic (e.g. social distancing) in an effort to limit this patient's exposure and mitigate transmission in our community. Due to her co-morbid illnesses, this patient is at least at moderate risk for complications without adequate follow up. This format is felt to be most appropriate for this patient at this time. The patient did not have access to video technology or had technical difficulties with video requiring transitioning to audio format only (telephone). Physical exam was limited to content and character of the telephone converstion.    Patient location: home Provider location: [facility]  I discussed the limitations of evaluation and management by telemedicine and the availability of in person appointments. The patient expressed understanding and agreed to proceed.   Visit Date: 04/26/2022  Today's healthcare provider: Mardene Speak, PA-C   CC: persistent cough  Subjective    HPI  Has been having cough for the past 10-11 days. At first, she has been having sore throat, body aches, chest congestion and greenish nasal discharge but not ear pain or itchy eyes or changes in taste and in smell. Currently, she endorses having only hoarseness and "bad" cough and doing overall better. She has to take cough drops to sooth her cough before going to bed.  Requested to refill her medication for indigestion but it was refilled on 04/26/22 . Was seen at clinic a mo ago for her chronic conditions She is planning to leave for a trip this Wednesday and returned back on 17th.   Medications: Outpatient Medications Prior to Visit  Medication Sig   allopurinol (ZYLOPRIM) 300 MG tablet TAKE 1 TABLET(300 MG) BY MOUTH DAILY   Ascorbic Acid (VITAMIN C) 500 MG CHEW Chew 2 tablets by mouth daily.   celecoxib (CELEBREX) 200 MG capsule Take  200 mg by mouth daily.   Cholecalciferol (VITAMIN D-1000 MAX ST) 25 MCG (1000 UT) tablet Take 1 tablet by mouth daily.   gabapentin (NEURONTIN) 100 MG capsule TAKE 1 CAPSULE(100 MG) BY MOUTH AT BEDTIME   hydroquinone 4 % cream Apply to dark spots twice a day until improved.   levothyroxine (SYNTHROID) 75 MCG tablet TAKE 1 TABLET(75 MCG) BY MOUTH DAILY MUST HAVE PRIOR ORDERED LABS DRAWN PRIOR TO FURTHER REFILLS   meloxicam (MOBIC) 7.5 MG tablet Take 1 tablet (7.5 mg total) by mouth 2 (two) times daily as needed for pain.   Multiple Vitamins-Minerals (ZINC PO) Take by mouth.   Naproxen Sodium 220 MG CAPS Take 1 tablet by mouth at bedtime as needed.   omeprazole (PRILOSEC) 20 MG capsule Take 1 capsule (20 mg total) by mouth daily.   simvastatin (ZOCOR) 40 MG tablet TAKE 1 TABLET(40 MG) BY MOUTH DAILY   triamterene-hydrochlorothiazide (DYAZIDE) 37.5-25 MG capsule TAKE 1 CAPSULE BY MOUTH EVERY DAY   No facility-administered medications prior to visit.    Review of Systems  Constitutional:  Negative for fatigue and fever.  HENT:  Positive for voice change. Negative for trouble swallowing.   Respiratory:  Positive for cough. Negative for chest tightness and shortness of breath.   Cardiovascular:  Negative for chest pain, palpitations and leg swelling.  All other systems reviewed and are negative. Except see HPi     Objective    LMP  (LMP Unknown) Comment: 1980s   Physical Exam Vitals reviewed.  Constitutional:      Appearance: Normal appearance.  HENT:     Head: Normocephalic and atraumatic.  Pulmonary:     Effort: Pulmonary effort is normal.  Neurological:     General: No focal deficit present.     Mental Status: She is alert and oriented to person, place, and time. Mental status is at baseline.  Psychiatric:        Behavior: Behavior normal.        Thought Content: Thought content normal.        Judgment: Judgment normal.       Assessment & Plan     1. Upper respiratory  symptom X 10-11 days Improving, she endorsed having only 2 "persistent"symptoms: cough and voice change No fever. Self-administered tests for Covid x 2 were negative. Was reassured and explained the nature of URI. Advised to proceed with ipratropium nasal spray and symptomatic treatment: Increase fluids.  Rest.  Saline nasal spray.  Mucinex as directed.  Humidifier in bedroom. Flonase OTC.  Call or return to clinic if symptoms are not improving.  Pt planned to leave for her trip on Wednesday for a week. Requested to place her medications / including a possible abx to her pharmacy before Wednesday. I will check on her status on Tuesday.  FU PRN   I discussed the assessment and treatment plan with the patient. The patient was provided an opportunity to ask questions and all were answered. The patient agreed with the plan and demonstrated an understanding of the instructions.   The patient was advised to call back or seek an in-person evaluation if the symptoms worsen or if the condition fails to improve as anticipated.  I provided 20 minutes of non-face-to-face time during this encounter.  The entirety of the information documented in the History of Present Illness, Review of Systems and Physical Exam were personally obtained by me. Portions of this information were initially documented by the CMA and reviewed by me for thoroughness and accuracy.  Portions of this note were created using dictation software and may contain typographical errors.    Mardene Speak, PA-C [facility] [phone] (phone) [phone] (fax)  [place] +

## 2022-04-27 ENCOUNTER — Other Ambulatory Visit: Payer: Self-pay | Admitting: Physician Assistant

## 2022-04-27 DIAGNOSIS — R059 Cough, unspecified: Secondary | ICD-10-CM

## 2022-04-27 DIAGNOSIS — R0989 Other specified symptoms and signs involving the circulatory and respiratory systems: Secondary | ICD-10-CM

## 2022-04-27 MED ORDER — IPRATROPIUM BROMIDE 0.03 % NA SOLN: 2.0000 | Freq: Two times a day (BID) | NASAL | 12 refills | Status: DC

## 2022-06-23 ENCOUNTER — Telehealth: Payer: Self-pay | Admitting: Family Medicine

## 2022-06-23 NOTE — Telephone Encounter (Signed)
[facility] faxed refill request for the following medications:   allopurinol (ZYLOPRIM) 300 MG tablet    Please advise

## 2022-06-24 MED ORDER — ALLOPURINOL 300 MG PO TABS: ORAL_TABLET | ORAL | 0 refills | Status: DC

## 2022-06-24 NOTE — Telephone Encounter (Signed)
LOV 02/10/22 NOV - None Last refill 07/23/21

## 2022-07-21 ENCOUNTER — Telehealth: Payer: Self-pay | Admitting: Family Medicine

## 2022-07-21 MED ORDER — LEVOTHYROXINE SODIUM 75 MCG PO TABS: ORAL_TABLET | ORAL | 0 refills | Status: DC

## 2022-07-21 NOTE — Telephone Encounter (Signed)
[facility] faxed refill request for the following medications:   levothyroxine (SYNTHROID) 75 MCG tablet   Please advise

## 2022-08-12 ENCOUNTER — Other Ambulatory Visit: Payer: Self-pay | Admitting: Family Medicine

## 2022-08-12 NOTE — Telephone Encounter (Signed)
Requested medications are due for refill today.  Unsure  Requested medications are on the active medications list.  no  Last refill. unsure  Future visit scheduled.   no  Notes to clinic.  Medication was discontinued 03/01/2022. Medication not on med list. Refill not delegated.    Requested Prescriptions  Pending Prescriptions Disp Refills   zolpidem (AMBIEN) 5 MG tablet [Pharmacy Med Name: ZOLPIDEM 5MG TABLETS] 30 tablet     Sig: TAKE 1 TABLET(5 MG) BY MOUTH AT BEDTIME AS NEEDED FOR SLEEP     Not Delegated - Psychiatry:  Anxiolytics/Hypnotics Failed - 08/12/2022  2:54 PM      Failed - This refill cannot be delegated      Failed - Urine Drug Screen completed in last 360 days      Failed - Valid encounter within last 6 months    Recent Outpatient Visits           3 months ago Upper respiratory symptom   [facility] [place], [place], PA-C   6 months ago Hypertension, unspecified type   [facility] Eulas Post, MD   12 months ago Lumbosacral radiculopathy at [place], MD   1 year ago Lumbosacral radiculopathy at [place] Eulas Post, MD   1 year ago Encounter for annual wellness visit (AWV) in Medicare patient   [facility] Eulas Post, MD       Future Appointments             In 3 weeks Brendolyn Patty, MD [place]

## 2022-08-13 NOTE — Telephone Encounter (Signed)
Requested medication (s) are due for refill today:   Provider to review  Requested medication (s) are on the active medication list:   N/A  Future visit scheduled:   No   Last ordered: 03/01/2022 this was discontinued  Returned because it's a non delegated refill that has been discontinued.   Still listed as a Surveyor, quantity pt.      Requested Prescriptions  Pending Prescriptions Disp Refills   zolpidem (AMBIEN) 5 MG tablet [Pharmacy Med Name: ZOLPIDEM '5MG'$  TABLETS] 30 tablet     Sig: TAKE 1 TABLET(5 MG) BY MOUTH AT BEDTIME AS NEEDED FOR SLEEP     Not Delegated - Psychiatry:  Anxiolytics/Hypnotics Failed - 08/12/2022  6:11 PM      Failed - This refill cannot be delegated      Failed - Urine Drug Screen completed in last 360 days      Failed - Valid encounter within last 6 months    Recent Outpatient Visits           3 months ago Upper respiratory symptom   [place], [place], PA-C   6 months ago Hypertension, unspecified type   [facility] Eulas Post, MD   1 year ago Lumbosacral radiculopathy at [place], MD   1 year ago Lumbosacral radiculopathy at [place] Eulas Post, MD   1 year ago Encounter for annual wellness visit (AWV) in Medicare patient   [facility] Eulas Post, MD       Future Appointments             In 3 weeks Brendolyn Patty, MD [place]

## 2022-08-17 ENCOUNTER — Encounter: Payer: Self-pay | Admitting: Physician Assistant

## 2022-08-17 ENCOUNTER — Ambulatory Visit (INDEPENDENT_AMBULATORY_CARE_PROVIDER_SITE_OTHER): Payer: Medicare Other | Admitting: Physician Assistant

## 2022-08-17 ENCOUNTER — Ambulatory Visit: Payer: Medicare Other | Admitting: Dermatology

## 2022-08-17 VITALS — BP 102/63 | HR 72

## 2022-08-17 VITALS — BP 112/83 | HR 68 | Temp 97.9°F | Wt 166.0 lb

## 2022-08-17 DIAGNOSIS — L814 Other melanin hyperpigmentation: Secondary | ICD-10-CM | POA: Diagnosis not present

## 2022-08-17 DIAGNOSIS — F5101 Primary insomnia: Secondary | ICD-10-CM | POA: Diagnosis not present

## 2022-08-17 DIAGNOSIS — E039 Hypothyroidism, unspecified: Secondary | ICD-10-CM

## 2022-08-17 DIAGNOSIS — L578 Other skin changes due to chronic exposure to nonionizing radiation: Secondary | ICD-10-CM

## 2022-08-17 DIAGNOSIS — L905 Scar conditions and fibrosis of skin: Secondary | ICD-10-CM

## 2022-08-17 DIAGNOSIS — L820 Inflamed seborrheic keratosis: Secondary | ICD-10-CM

## 2022-08-17 DIAGNOSIS — L821 Other seborrheic keratosis: Secondary | ICD-10-CM | POA: Diagnosis not present

## 2022-08-17 MED ORDER — ZOLPIDEM TARTRATE 5 MG PO TABS: 5.0000 mg | ORAL_TABLET | Freq: Every evening | ORAL | 2 refills | Status: DC | PRN

## 2022-08-17 MED ORDER — LEVOTHYROXINE SODIUM 75 MCG PO TABS: ORAL_TABLET | ORAL | 0 refills | Status: DC

## 2022-08-17 NOTE — Telephone Encounter (Signed)
Pt called to report that she is completely out of her current supply, she says it took her over a year to finish her 30 tablet supply because she only takes them as needed. She leaves town Thursday and is hoping to receive this Rx today or tomorrow. Wants to know why if this cannot be refilled for her?   Zolpidem -  [id number] - [address]  [place] [address]  Phone: [phone] Fax: [phone]

## 2022-08-17 NOTE — Progress Notes (Signed)
Follow-Up Visit   Subjective  [name] is a 72 y.o. female who presents for the following: Follow-up.  Patient presents for 6 month follow-up Aks and history of SCC, BCC. She has a spot on her scalp and right lower leg she would like checked, gets irritated. Also recheck SK vs lentigo of the left foot dorsum and right medial ankle. She has used hydroquinone 4% cream, but hasn't noticed much improvement.   The following portions of the chart were reviewed this encounter and updated as appropriate:       Review of Systems:  No other skin or systemic complaints except as noted in HPI or Assessment and Plan.  Objective  Well appearing patient in no apparent distress; mood and affect are within normal limits.  A focused examination was performed including face, scalp, hands, legs, feet. Relevant physical exam findings are noted in the Assessment and Plan.  left foot dorsum; right dorsal ankle Hyperpigmented patches, slightly lighter compared to photos.  left medial calf Dyspigmented atrophic plaque   left frontal scalp, right lower pretibia x 2 (2) Erythematous stuck-on, waxy papule    Assessment & Plan  Actinic Damage - chronic, secondary to cumulative UV radiation exposure/sun exposure over time - diffuse scaly erythematous macules with underlying dyspigmentation - Recommend daily broad spectrum sunscreen SPF 30+ to sun-exposed areas, reapply every 2 hours as needed.  - Recommend staying in the shade or wearing long sleeves, sun glasses (UVA+UVB protection) and wide brim hats (4-inch brim around the entire circumference of the hat). - Call for new or changing lesions.  Seborrheic Keratoses - Stuck-on, waxy, tan-brown papules and/or plaques  - Benign-appearing - Discussed benign etiology and prognosis. - Observe - Call for any changes  Lentigines - Scattered tan macules  - Due to sun exposure - Benign-appearing, observe - Recommend daily broad spectrum  sunscreen SPF 30+ to sun-exposed areas, reapply every 2 hours as needed. - Call for any changes Counseling for BBL / IPL / Laser and Coordination of Care Discussed the treatment option of Broad Band Light (BBL) /Intense Pulsed Light (IPL)/ Laser for skin discoloration, including brown spots and redness.  Typically we recommend at least 1-3 treatment sessions about 5-8 weeks apart for best results.  Cannot have tanned skin when BBL performed, and regular use of sunscreen is advised after the procedure to help maintain results. The patient's condition may also require "maintenance treatments" in the future.  The fee for BBL / laser treatments is $350 per treatment session for the whole face.  A fee can be quoted for other parts of the body.  Insurance typically does not pay for BBL/laser treatments and therefore the fee is an out-of-pocket cost. Discussed $200 per treatment to the hands.   Lentigo left foot dorsum; right dorsal ankle  vs SK. Benign-appearing.  Some improvement with 4% hydroquinone cream, more on ankle than foot. Will continue to left foot dorsum and right medial ankle.   Consider biopsy to left foot dorsum on f/u to confirm diagnosis.  Could also consider BBL vrs LN2 to L foot dorsum to help fade.  Also could send in stronger fade cream to [facility].   Related Medications hydroquinone 4 % cream Apply to dark spots twice a day until improved.  Scar left medial calf  S/p SCC excision 05/27/20. Scar clear, no evidence of recurrence.   Inflamed seborrheic keratosis (3) right lower pretibia x 2 (2); left frontal scalp  Symptomatic, irritating, patient would like treated. Cryotherapy today  to the R lower pretibia. Will treat L frontal scalp on f/u. Defers today due to going out of town.   Destruction of lesion - right lower pretibia x 2  Destruction method: cryotherapy   Informed consent: discussed and consent obtained   Lesion destroyed using liquid nitrogen: Yes   Region frozen  until ice ball extended beyond lesion: Yes   Outcome: patient tolerated procedure well with no complications   Post-procedure details: wound care instructions given   Additional details:  Prior to procedure, discussed risks of blister formation, small wound, skin dyspigmentation, or rare scar following cryotherapy. Recommend Vaseline ointment to treated areas while healing.    Return for biopsy left foot and LN2 to scalp. Also schedule BBL to hands.Lindi Adie, CMA, am acting as scribe for Brendolyn Patty, MD .  Documentation: I have reviewed the above documentation for accuracy and completeness, and I agree with the above.  Brendolyn Patty MD

## 2022-08-17 NOTE — Patient Instructions (Addendum)
Cryotherapy Aftercare  Wash gently with soap and water everyday.   Apply Vaseline and Band-Aid daily until healed.    Counseling for BBL / IPL / Laser and Coordination of Care Discussed the treatment option of Broad Band Light (BBL) /Intense Pulsed Light (IPL)/ Laser for skin discoloration, including brown spots and redness.  Typically we recommend at least 1-3 treatment sessions about 5-8 weeks apart for best results.  Cannot have tanned skin when BBL performed, and regular use of sunscreen is advised after the procedure to help maintain results. The patient's condition may also require "maintenance treatments" in the future.  The fee for BBL / laser treatments is $350 per treatment session for the whole face.  A fee can be quoted for other parts of the body.  Insurance typically does not pay for BBL/laser treatments and therefore the fee is an out-of-pocket cost. Discussed $200 per treatment to the hands only.    Due to recent changes in healthcare laws, you may see results of your pathology and/or laboratory studies on MyChart before the doctors have had a chance to review them. We understand that in some cases there may be results that are confusing or concerning to you. Please understand that not all results are received at the same time and often the doctors may need to interpret multiple results in order to provide you with the best plan of care or course of treatment. Therefore, we ask that you please give [place] 2 business days to thoroughly review all your results before contacting the office for clarification. Should we see a critical lab result, you will be contacted sooner.   If You Need Anything After Your Visit  If you have any questions or concerns for your doctor, please call our main line at [phone] and press option 4 to reach your doctor's medical assistant. If no one answers, please leave a voicemail as directed and we will return your call as soon as possible. Messages left after  4 pm will be answered the following business day.   You may also send [place] a message via [place]. We typically respond to MyChart messages within 1-2 business days.  For prescription refills, please ask your pharmacy to contact our office. Our fax number is [phone].  If you have an urgent issue when the clinic is closed that cannot wait until the next business day, you can page your doctor at the number below.    Please note that while we do our best to be available for urgent issues outside of office hours, we are not available 24/7.   If you have an urgent issue and are unable to reach [place], you may choose to seek medical care at your doctor's office, retail clinic, urgent care center, or emergency room.  If you have a medical emergency, please immediately call 911 or go to the emergency department.  Pager Numbers  - Dr. Nehemiah Massed: [phone]  - Dr. Laurence Ferrari: [phone]  - Dr. Nicole Kindred: [phone]  In the event of inclement weather, please call our main line at [phone] for an update on the status of any delays or closures.  Dermatology Medication Tips: Please keep the boxes that topical medications come in in order to help keep track of the instructions about where and how to use these. Pharmacies typically print the medication instructions only on the boxes and not directly on the medication tubes.   If your medication is too expensive, please contact our office at [phone] option 4 or send [place]  a message through [place].   We are unable to tell what your co-pay for medications will be in advance as this is different depending on your insurance coverage. However, we may be able to find a substitute medication at lower cost or fill out paperwork to get insurance to cover a needed medication.   If a prior authorization is required to get your medication covered by your insurance company, please allow [place] 1-2 business days to complete this process.  Drug prices often vary  depending on where the prescription is filled and some pharmacies may offer cheaper prices.  The website www.goodrx.com contains coupons for medications through different pharmacies. The prices here do not account for what the cost may be with help from insurance (it may be cheaper with your insurance), but the website can give you the price if you did not use any insurance.  - You can print the associated coupon and take it with your prescription to the pharmacy.  - You may also stop by our office during regular business hours and pick up a GoodRx coupon card.  - If you need your prescription sent electronically to a different pharmacy, notify our office through [facility] or by phone at [phone] option 4.     Si Usted Necesita Algo Despus de Su Visita  Tambin puede enviarnos un mensaje a travs de Pharmacist, community. Por lo general respondemos a los mensajes de MyChart en el transcurso de 1 a 2 das hbiles.  Para renovar recetas, por favor pida a su farmacia que se ponga en contacto con nuestra oficina. Harland Dingwall de fax es [place] [phone].  Si tiene un asunto urgente cuando la clnica est cerrada y que no puede esperar hasta el siguiente da hbil, puede llamar/localizar a su doctor(a) al nmero que aparece a continuacin.   Por favor, tenga en cuenta que aunque hacemos todo lo posible para estar disponibles para asuntos urgentes fuera del horario de [place], no estamos disponibles las 24 horas del da, los 7 das de la [place].   Si tiene un problema urgente y no puede comunicarse con nosotros, puede optar por buscar atencin mdica  en el consultorio de su doctor(a), en una clnica privada, en un centro de atencin urgente o en una sala de emergencias.  Si tiene Engineering geologist, por favor llame inmediatamente al 911 o vaya a la sala de emergencias.  Nmeros de bper  - Dr. Nehemiah Massed: [phone]  - Dra. Moye: [phone]  - Dra. Nicole Kindred: [phone]  En caso de  inclemencias del [place], por favor llame a Johnsie Kindred principal al [phone] para una actualizacin sobre el B and E de cualquier retraso o cierre.  Consejos para la medicacin en dermatologa: Por favor, guarde las cajas en las que vienen los medicamentos de uso tpico para ayudarle a seguir las instrucciones sobre dnde y cmo usarlos. Las farmacias generalmente imprimen las instrucciones del medicamento slo en las cajas y no directamente en los tubos del [place].   Si su medicamento es muy caro, por favor, pngase en contacto con Zigmund Daniel llamando al [phone] y presione la opcin 4 o envenos un mensaje a travs de Pharmacist, community.   No podemos decirle cul ser su copago por los medicamentos por adelantado ya que esto es diferente dependiendo de la cobertura de su seguro. Sin embargo, es posible que podamos encontrar un medicamento sustituto a Electrical engineer un formulario para que el seguro cubra el medicamento que se considera necesario.   Si se requiere  una autorizacin previa para que su compaa de seguros [place] su medicamento, por favor permtanos de 1 a 2 das hbiles para completar este proceso.  Los precios de los medicamentos varan con frecuencia dependiendo del Environmental consultant de dnde se surte la receta y alguna farmacias pueden ofrecer precios ms baratos.  El sitio web www.goodrx.com tiene cupones para medicamentos de Airline pilot. Los precios aqu no tienen en cuenta lo que podra costar con la ayuda del seguro (puede ser ms barato con su seguro), pero el sitio web puede darle el precio si no utiliz Research scientist (physical sciences).  - Puede imprimir el cupn correspondiente y llevarlo con su receta a la farmacia.  - Tambin puede pasar por nuestra oficina durante el horario de atencin regular y Charity fundraiser una tarjeta de cupones de GoodRx.  - Si necesita que su receta se enve electrnicamente a una farmacia diferente, informe a nuestra oficina a travs de MyChart de [place] o  por telfono llamando al [phone] y presione la opcin 4.

## 2022-08-17 NOTE — Progress Notes (Signed)
[place]ina P[place],acting as a Education administrator for [employer], PA-C.,have documented all relevant documentation on the behalf of Mardene Speak, PA-C,as directed by  [employer], PA-C while in the presence of [employer], PA-C.     Established patient visit   Patient: [name]   DOB: [id number]   72 y.o. Female  MRN: [id number] Visit Date: 08/17/2022  Today's healthcare provider: Mardene Speak, PA-C  CC: hypothyroid and insomnia FU  Subjective    HPI  Insomnia Patient is a 72 year old female who presents with complaint of insomnia.  She wishes to get prescription for Ambien refilled.  Patient states she only takes half a pill /1/4 of a pill when she needs it.    Patient is also due to have her thyroid tested.  She will need to have labs.  Patient is leaving out of state for an unknown period of time and will need her medication refilled.  If possible she would like to get 1 more month of her current prescription without refills so she does not have to worry about getting the Rx filled in another state.  Medications: Outpatient Medications Prior to Visit  Medication Sig   allopurinol (ZYLOPRIM) 300 MG tablet TAKE 1 TABLET(300 MG) BY MOUTH DAILY   Ascorbic Acid (VITAMIN C) 500 MG CHEW Chew 2 tablets by mouth daily.   Cholecalciferol (VITAMIN D-1000 MAX ST) 25 MCG (1000 UT) tablet Take 1 tablet by mouth daily.   gabapentin (NEURONTIN) 100 MG capsule TAKE 1 CAPSULE(100 MG) BY MOUTH AT BEDTIME   hydroquinone 4 % cream Apply to dark spots twice a day until improved.   ipratropium (ATROVENT) 0.03 % nasal spray Place 2 sprays into both nostrils every 12 (twelve) hours.   Multiple Vitamins-Minerals (ZINC PO) Take by mouth.   Naproxen Sodium 220 MG CAPS Take 1 tablet by mouth at bedtime as needed.   omeprazole (PRILOSEC) 20 MG capsule Take 1 capsule (20 mg total) by mouth daily.   simvastatin (ZOCOR) 40 MG tablet TAKE 1 TABLET(40 MG) BY MOUTH DAILY   triamterene-hydrochlorothiazide  (DYAZIDE) 37.5-25 MG capsule TAKE 1 CAPSULE BY MOUTH EVERY DAY   [DISCONTINUED] levothyroxine (SYNTHROID) 75 MCG tablet Take 1 tablet daily. Please schedule office visit before any future refill.   celecoxib (CELEBREX) 200 MG capsule Take 200 mg by mouth daily. (Patient not taking: Reported on 08/17/2022)   meloxicam (MOBIC) 7.5 MG tablet Take 1 tablet (7.5 mg total) by mouth 2 (two) times daily as needed for pain. (Patient not taking: Reported on 08/17/2022)   No facility-administered medications prior to visit.    Review of Systems  Constitutional:  Negative for fatigue.  Endocrine: Negative for cold intolerance and heat intolerance.  Psychiatric/Behavioral:  Positive for sleep disturbance. Negative for agitation.        Objective    BP 112/83 (BP Location: Left Arm, Patient Position: Sitting, Cuff Size: Normal)   Pulse 68   Temp 97.9 F (36.6 C) (Oral)   Wt 166 lb (75.3 kg)   LMP  (LMP Unknown) Comment: 1980s  SpO2 99%   BMI 28.05 kg/m    Physical Exam Constitutional:      General: She is not in acute distress.    Appearance: Normal appearance.  HENT:     Head: Normocephalic and atraumatic.     Nose: Nose normal.  Eyes:     General: No scleral icterus.       Right eye: No discharge.  Left eye: No discharge.     Extraocular Movements: Extraocular movements intact.     Conjunctiva/sclera: Conjunctivae normal.     Pupils: Pupils are equal, round, and reactive to light.  Pulmonary:     Effort: Pulmonary effort is normal. No respiratory distress.  Musculoskeletal:        General: Normal range of motion.     Cervical back: Normal range of motion.  Neurological:     General: No focal deficit present.     Mental Status: She is alert and oriented to person, place, and time. Mental status is at baseline.  Psychiatric:        Behavior: Behavior normal.        Thought Content: Thought content normal.        Judgment: Judgment normal.      No results found for any  visits on 08/17/22.  Assessment & Plan      1. Hypothyroidism, unspecified type Chronic and previously controlled Tsh was wnl in 07/2022 Updated labs needed - TSH - levothyroxine (SYNTHROID) 75 MCG tablet; Take 1 tablet daily. Please schedule office visit before any future refill.  Dispense: 90 tablet; Refill: 0 Will reassess after receiving TSH results  2. Primary insomnia Chronic and unstable Has been taking ambien 5 mg , was prescirbed her ambien in 2023, used only first refill She has been using only 1/2-1/4 of a pill at bedtime as needed Agreed to prescribe 15 tablets per mo Counseled on side effects of using zolpidem long-term and advised to use this only on as needed base. 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 - zolpidem (AMBIEN) 5 MG tablet; Take 1 tablet (5 mg total) by mouth at bedtime as needed for sleep.  Dispense: 15 tablet; Refill: 2 Will reassess after her return from the trip Will need to fu after return  The patient was advised to call back or seek an in-person evaluation if the symptoms worsen or if the condition fails to improve as anticipated.  I discussed the assessment and treatment plan with the patient. The patient was provided an opportunity to ask questions and all were answered. The patient agreed with the plan and demonstrated an understanding of the instructions.  I, Mardene Speak, PA-C have reviewed all documentation for this visit. The documentation on  08/17/22 for the exam, diagnosis, procedures, and orders are all accurate and complete.  Mardene Speak, [facility], [place] [phone] (phone) [phone] (fax)   [place]

## 2022-08-18 DIAGNOSIS — E039 Hypothyroidism, unspecified: Secondary | ICD-10-CM | POA: Diagnosis not present

## 2022-08-19 LAB — TSH: TSH: 2.97 u[IU]/mL (ref 0.450–4.500)

## 2022-08-19 NOTE — Progress Notes (Signed)
Please, let pt know that her TSH labs was WNL, continue with her current medication 's dose.

## 2022-09-05 DIAGNOSIS — R059 Cough, unspecified: Secondary | ICD-10-CM | POA: Diagnosis not present

## 2022-09-05 DIAGNOSIS — R051 Acute cough: Secondary | ICD-10-CM | POA: Diagnosis not present

## 2022-09-07 ENCOUNTER — Ambulatory Visit: Payer: Medicare Other | Admitting: Dermatology

## 2022-09-15 ENCOUNTER — Telehealth: Payer: Self-pay | Admitting: Physician Assistant

## 2022-09-15 NOTE — Telephone Encounter (Signed)
[facility] pharmacy requesting prescription refill triamterene-hydrochlorothiazide (DYAZIDE) 37.5-25 MG capsule  Please advise

## 2022-09-16 ENCOUNTER — Other Ambulatory Visit: Payer: Self-pay

## 2022-09-16 DIAGNOSIS — I10 Essential (primary) hypertension: Secondary | ICD-10-CM

## 2022-09-16 MED ORDER — TRIAMTERENE-HCTZ 37.5-25 MG PO CAPS: ORAL_CAPSULE | ORAL | 1 refills | Status: DC

## 2022-09-20 ENCOUNTER — Other Ambulatory Visit: Payer: Self-pay

## 2022-09-20 ENCOUNTER — Telehealth: Payer: Self-pay | Admitting: Physician Assistant

## 2022-09-20 DIAGNOSIS — E7849 Other hyperlipidemia: Secondary | ICD-10-CM

## 2022-09-20 MED ORDER — SIMVASTATIN 40 MG PO TABS: ORAL_TABLET | ORAL | 3 refills | Status: DC

## 2022-09-20 NOTE — Telephone Encounter (Signed)
LOV 08/14/22 NOV none  Last refill of this was on 04/15/21

## 2022-09-20 NOTE — Telephone Encounter (Signed)
[facility] pharmacy requesting prescription refill simvastatin (ZOCOR) 40 MG tablet   Please advise

## 2022-09-27 ENCOUNTER — Telehealth: Payer: Self-pay | Admitting: Physician Assistant

## 2022-09-27 MED ORDER — ALLOPURINOL 300 MG PO TABS: ORAL_TABLET | ORAL | 1 refills | Status: DC

## 2022-09-27 NOTE — Telephone Encounter (Signed)
[facility] pharmacy faxed refill request for the following medications:   allopurinol (ZYLOPRIM) 300 MG tablet    Please advise  

## 2022-09-27 NOTE — Telephone Encounter (Signed)
Rx filled

## 2022-10-01 ENCOUNTER — Other Ambulatory Visit: Payer: Self-pay | Admitting: Family Medicine

## 2022-10-01 DIAGNOSIS — Z1231 Encounter for screening mammogram for malignant neoplasm of breast: Secondary | ICD-10-CM

## 2022-10-01 NOTE — Telephone Encounter (Signed)
This patient states she spoke to you when you saw her husband virtually about weight loss and said you would need to see her.  Does that mean you need it to be in person or is virtual ok

## 2022-10-05 ENCOUNTER — Ambulatory Visit (INDEPENDENT_AMBULATORY_CARE_PROVIDER_SITE_OTHER): Payer: Medicare Other | Admitting: Family Medicine

## 2022-10-05 ENCOUNTER — Encounter: Payer: Self-pay | Admitting: Family Medicine

## 2022-10-05 VITALS — BP 113/72 | HR 88 | Temp 98.5°F | Resp 16 | Ht 64.0 in | Wt 164.9 lb

## 2022-10-05 DIAGNOSIS — E663 Overweight: Secondary | ICD-10-CM | POA: Diagnosis not present

## 2022-10-05 DIAGNOSIS — E7849 Other hyperlipidemia: Secondary | ICD-10-CM

## 2022-10-05 DIAGNOSIS — Z6828 Body mass index (BMI) 28.0-28.9, adult: Secondary | ICD-10-CM | POA: Diagnosis not present

## 2022-10-05 MED ORDER — TIRZEPATIDE-WEIGHT MANAGEMENT 2.5 MG/0.5ML ~~LOC~~ SOAJ: 2.5000 mg | SUBCUTANEOUS | 0 refills | Status: DC

## 2022-10-05 NOTE — Patient Instructions (Signed)
Please use the printed script at a pharmacy that has the supply of Zepbound   We will collect labs to measure liver, kidney function and check for A1c. We will follow up with results of labs once they are available.   Please follow up 1 month after starting the medication

## 2022-10-05 NOTE — Progress Notes (Signed)
I,Joseline E Rosas,acting as a scribe for [employer], MD.,have documented all relevant documentation on the behalf of Ronnald Ramp, MD,as directed by  Ronnald Ramp, MD while in the presence of Ronnald Ramp, MD.   Established patient visit   Patient: [name]   DOB: 1950-09-28   72 y.o. Female  MRN: [id number] Visit Date: 10/05/2022  Today's healthcare provider: Ronnald Ramp, MD   Chief Complaint  Patient presents with   Weight Loss   Subjective    HPI  Weight Management  Patient presents today to discuss weight management medication  She states that her heaviest weight was 180 in 2015 and her current weight is 164.9 lbs  She would like to lose 25-30 pounds  Her motivation for weight loss is feeling better.  Her current efforts for weight loss include low carb diet and trying to walk. States she walks 25-30 minutes 3 times  a week. Depending on the weather.  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   celecoxib (CELEBREX) 200 MG capsule Take 200 mg by mouth daily.   Cholecalciferol (VITAMIN D-1000 MAX ST) 25 MCG (1000 UT) tablet Take 1 tablet by mouth daily.   gabapentin (NEURONTIN) 100 MG capsule TAKE 1 CAPSULE(100 MG) BY MOUTH AT BEDTIME   hydroquinone 4 % cream Apply to dark spots twice a day until improved.   levothyroxine  (SYNTHROID) 75 MCG tablet Take 1 tablet daily. Please schedule office visit before any future refill.   Multiple Vitamins-Minerals (ZINC PO) Take by mouth.   Naproxen Sodium 220 MG CAPS Take 1 tablet by mouth at bedtime as needed.   omeprazole (PRILOSEC) 20 MG capsule Take 1 capsule (20 mg total) by mouth daily.   simvastatin (ZOCOR) 40 MG tablet TAKE 1 TABLET(40 MG) BY MOUTH DAILY   triamterene-hydrochlorothiazide (DYAZIDE) 37.5-25 MG capsule TAKE 1 CAPSULE BY MOUTH EVERY DAY   zolpidem (AMBIEN) 5 MG tablet Take 1 tablet (5 mg total) by mouth at bedtime as needed for sleep.   meloxicam (MOBIC) 7.5 MG tablet Take 1 tablet (7.5 mg total) by mouth 2 (two) times daily as needed for pain. (Patient not taking: Reported on 08/17/2022)   [DISCONTINUED] ipratropium (ATROVENT) 0.03 % nasal spray Place 2 sprays into both nostrils every 12 (twelve) hours.   No facility-administered medications prior to visit.    Review of Systems     Objective    BP 113/72 (BP Location: Left Arm, Patient Position: Sitting, Cuff Size: Large)   Pulse 88   Temp 98.5 F (36.9 C) (Oral)   Resp 16   Ht  (1.626 m)   Wt 164 lb 14.4 oz (74.8 kg)   LMP  (LMP Unknown) Comment: 1980s  BMI 28.31 kg/m    Physical Exam Vitals reviewed.  Constitutional:      General: She is not in acute  distress.    Appearance: Normal appearance. She is not ill-appearing, toxic-appearing or diaphoretic.  Eyes:     Conjunctiva/sclera: Conjunctivae normal.  Cardiovascular:     Rate and Rhythm: Normal rate and regular rhythm.     Pulses: Normal pulses.     Heart sounds: Normal heart sounds. No murmur heard.    No friction rub. No gallop.  Pulmonary:     Effort: Pulmonary effort is normal. No respiratory distress.     Breath sounds: Normal breath sounds. No stridor. No wheezing, rhonchi or rales.  Abdominal:     General: Bowel sounds are normal. There is no distension.     Palpations: Abdomen is soft.     Tenderness: There is no  abdominal tenderness.  Musculoskeletal:     Right lower leg: No edema.     Left lower leg: No edema.  Skin:    Findings: No erythema or rash.  Neurological:     Mental Status: She is alert and oriented to person, place, and time.      No results found for any visits on 10/05/22.   Assessment & Plan     Problem List Items Addressed This Visit       Other   Other hyperlipidemia    Associated with BMI 28, categorized as overweight BMI  Continue simvastatin  daily       Relevant Medications   tirzepatide (ZEPBOUND) 2.5 MG/0.5ML Pen   Overweight with body mass index (BMI) of 28 to 28.9 in adult - Primary    BMI 28 Patient unable to lose 30 pounds with physical activity and diet change  Will start patient on zep bound 2.5mg  weekly and follow up in 1 month  Patient provided with printed       Relevant Medications   tirzepatide (ZEPBOUND) 2.5 MG/0.5ML Pen   Other Relevant Orders   Comprehensive metabolic panel   Hemoglobin A1c     Return in about 1 month (around 11/04/2022) for weight management.      The entirety of the information documented in the History of Present Illness, Review of Systems and Physical Exam were personally obtained by me. Portions of this information were initially documented by Hetty Ely, CMA . I, Ronnald Ramp, MD have reviewed the documentation above for thoroughness and accuracy.   Ronnald Ramp, MD  [facility] [phone] (phone) [phone] (fax)  [facility]

## 2022-10-05 NOTE — Assessment & Plan Note (Signed)
BMI 28 Patient unable to lose 30 pounds with physical activity and diet change  Will start patient on zep bound 2.5mg  weekly and follow up in 1 month  Patient provided with printed

## 2022-10-05 NOTE — Assessment & Plan Note (Addendum)
Associated with BMI 28, categorized as overweight BMI  Continue simvastatin  daily

## 2022-10-06 ENCOUNTER — Other Ambulatory Visit: Payer: Self-pay | Admitting: Family Medicine

## 2022-10-06 DIAGNOSIS — E876 Hypokalemia: Secondary | ICD-10-CM

## 2022-10-06 LAB — HEMOGLOBIN A1C
Est. average glucose Bld gHb Est-mCnc: 100 mg/dL
Hgb A1c MFr Bld: 5.1 % (ref 4.8–5.6)

## 2022-10-06 LAB — COMPREHENSIVE METABOLIC PANEL
ALT: 20 IU/L (ref 0–32)
AST: 21 IU/L (ref 0–40)
Albumin/Globulin Ratio: 1.8 (ref 1.2–2.2)
Albumin: 4.4 g/dL (ref 3.8–4.8)
Alkaline Phosphatase: 111 IU/L (ref 44–121)
BUN/Creatinine Ratio: 14 (ref 12–28)
BUN: 15 mg/dL (ref 8–27)
Bilirubin Total: 0.5 mg/dL (ref 0.0–1.2)
CO2: 23 mmol/L (ref 20–29)
Calcium: 9.6 mg/dL (ref 8.7–10.3)
Chloride: 103 mmol/L (ref 96–106)
Creatinine, Ser: 1.08 mg/dL — ABNORMAL HIGH (ref 0.57–1.00)
Globulin, Total: 2.4 g/dL (ref 1.5–4.5)
Glucose: 104 mg/dL — ABNORMAL HIGH (ref 70–99)
Potassium: 3.3 mmol/L — ABNORMAL LOW (ref 3.5–5.2)
Sodium: 142 mmol/L (ref 134–144)
Total Protein: 6.8 g/dL (ref 6.0–8.5)
eGFR: 55 mL/min/{1.73_m2} — ABNORMAL LOW (ref >59)

## 2022-10-06 MED ORDER — POTASSIUM CHLORIDE CRYS ER 10 MEQ PO TBCR: 20.0000 meq | EXTENDED_RELEASE_TABLET | Freq: Every day | ORAL | 0 refills | Status: DC

## 2022-10-12 ENCOUNTER — Encounter: Payer: Self-pay | Admitting: Family Medicine

## 2022-10-12 ENCOUNTER — Ambulatory Visit: Payer: Self-pay | Admitting: Dermatology

## 2022-10-26 ENCOUNTER — Ambulatory Visit
Admission: RE | Admit: 2022-10-26 | Discharge: 2022-10-26 | Disposition: A | Discharge status: Home / Self Care | Payer: Medicare (Managed Care) | Source: Ambulatory Visit | Attending: Family Medicine | Admitting: Family Medicine

## 2022-10-26 DIAGNOSIS — Z1231 Encounter for screening mammogram for malignant neoplasm of breast: Secondary | ICD-10-CM | POA: Insufficient documentation

## 2022-11-03 ENCOUNTER — Ambulatory Visit: Payer: Self-pay | Admitting: Dermatology

## 2022-11-18 ENCOUNTER — Other Ambulatory Visit: Payer: Self-pay | Admitting: Family Medicine

## 2022-11-18 DIAGNOSIS — E039 Hypothyroidism, unspecified: Secondary | ICD-10-CM

## 2022-11-18 NOTE — Telephone Encounter (Signed)
Requested Prescriptions  Pending Prescriptions Disp Refills   levothyroxine (SYNTHROID) 75 MCG tablet [Pharmacy Med Name: LEVOTHYROXINE 0.075MG  ( ) TABS] 90 tablet 1    Sig: TAKE 1 TABLET BY MOUTH DAILY     Endocrinology:  Hypothyroid Agents Passed - 11/18/2022  9:18 AM      Passed - TSH in normal range and within 360 days    TSH  Date Value Ref Range Status  08/18/2022 2.970 0.450 - 4.500 uIU/mL Final         Passed - Valid encounter within last 12 months    Recent Outpatient Visits           1 month ago Overweight with body mass index (BMI) of 28 to 28.9 in adult   [facility], [place], MD   3 months ago Hypothyroidism, unspecified type   [facility], [place], PA-C   6 months ago Upper respiratory symptom   [place] [facility] [employer], [place], PA-C   9 months ago Hypertension, unspecified type   [facility] Bosie Clos, MD   1 year ago Lumbosacral radiculopathy at S1   [facility] Bosie Clos, MD       Future Appointments             In 1 month Willeen Niece, MD [facility]

## 2022-12-08 ENCOUNTER — Ambulatory Visit (INDEPENDENT_AMBULATORY_CARE_PROVIDER_SITE_OTHER): Payer: Medicare (Managed Care)

## 2022-12-08 ENCOUNTER — Other Ambulatory Visit: Payer: Self-pay | Admitting: Family Medicine

## 2022-12-08 VITALS — BP 110/64 | Ht 64.0 in | Wt 164.7 lb

## 2022-12-08 DIAGNOSIS — Z0000 Encounter for general adult medical examination without abnormal findings: Secondary | ICD-10-CM | POA: Diagnosis not present

## 2022-12-08 DIAGNOSIS — Z13820 Encounter for screening for osteoporosis: Secondary | ICD-10-CM

## 2022-12-08 DIAGNOSIS — Z79899 Other long term (current) drug therapy: Secondary | ICD-10-CM

## 2022-12-08 DIAGNOSIS — F339 Major depressive disorder, recurrent, unspecified: Secondary | ICD-10-CM

## 2022-12-08 NOTE — Progress Notes (Signed)
Subjective:   [name] is a 72 y.o. female who presents for Medicare Annual (Subsequent) preventive examination.  Visit Complete: In person  Patient Medicare AWV questionnaire was completed by the patient on (not done); I have confirmed that all information answered by patient is correct and no changes since this date.  Review of Systems    Cardiac Risk Factors include: advanced age (>44men, >50 women);dyslipidemia    Objective:    Today's Vitals   12/08/22 0945  BP: 110/64  Weight: 164 lb 11.2 oz (74.7 kg)  Height: 5\' 4"  (1.626 m)   Body mass index is 28.27 kg/m.     12/08/2022   10:14 AM  Advanced Directives  Does Patient Have a Medical Advance Directive? Yes    Current Medications (verified) Outpatient Encounter Medications as of 12/08/2022  Medication Sig   allopurinol (ZYLOPRIM) 300 MG tablet TAKE 1 TABLET(300 MG) BY MOUTH DAILYTAKE 1 TABLET(300 MG) BY MOUTH DAILY   Ascorbic Acid (VITAMIN C) 500 MG CHEW Chew 2 tablets by mouth daily.   celecoxib (CELEBREX) 200 MG capsule Take 200 mg by mouth daily.   Cholecalciferol (VITAMIN D-1000 MAX ST) 25 MCG (1000 UT) tablet Take 1 tablet by mouth daily.   gabapentin (NEURONTIN) 100 MG capsule TAKE 1 CAPSULE(100 MG) BY MOUTH AT BEDTIME   hydroquinone 4 % cream Apply to dark spots twice a day until improved.   levothyroxine (SYNTHROID) 75 MCG tablet TAKE 1 TABLET BY MOUTH DAILY   Multiple Vitamins-Minerals (ZINC PO) Take by mouth.   Naproxen Sodium 220 MG CAPS Take 1 tablet by mouth at bedtime as needed.   omeprazole (PRILOSEC) 20 MG capsule Take 1 capsule (20 mg total) by mouth daily.   potassium chloride (KLOR-CON M) 10 MEQ tablet Take 2 tablets (20 mEq total) by mouth daily for 5 days.   simvastatin (ZOCOR) 40 MG tablet TAKE 1 TABLET(40 MG) BY MOUTH DAILY   tirzepatide (ZEPBOUND) 2.5 MG/0.5ML Pen Inject 2.5 mg into the skin once a week.   triamterene-hydrochlorothiazide (DYAZIDE) 37.5-25 MG capsule TAKE 1 CAPSULE  BY MOUTH EVERY DAY   zolpidem (AMBIEN) 5 MG tablet Take 1 tablet (5 mg total) by mouth at bedtime as needed for sleep.   No facility-administered encounter medications on file as of 12/08/2022.    Allergies (verified) Patient has no known allergies.   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 Excised 05/27/2020, margins free.   Past Surgical History:  Procedure Laterality Date   ABDOMINAL HYSTERECTOMY     BACK SURGERY     BREAST CYST ASPIRATION Left    Neg   COLONOSCOPY WITH PROPOFOL N/A 06/12/2019   Procedure: COLONOSCOPY WITH PROPOFOL;  Surgeon: Midge Minium, MD;  Location: [facility] ENDOSCOPY;  Service: Endoscopy;  Laterality: N/A;   EYE SURGERY     cataracts   FOOT SURGERY     MELANOMA EXCISION     Family History  Problem Relation Age of Onset   Breast cancer Mother 74   Social History   Socioeconomic History   Marital status: Married    Spouse name:  Not on file   Number of children: Not on file   Years of education: Not on file   Highest education level: Associate degree: occupational, Scientist, product/process development, or vocational program  Occupational History   Not on file  Tobacco Use   Smoking status: Never   Smokeless tobacco: Never  Vaping Use   Vaping Use: Never used  Substance and Sexual Activity   Alcohol use: Not on file   Drug use: Not on file   Sexual activity: Not on file  Other Topics Concern   Not on file  Social History Narrative   Not on file   Social Determinants of Health   Financial Resource Strain: Medium Risk (12/08/2022)   Overall Financial Resource Strain (CARDIA)    Difficulty of Paying Living  Expenses: Somewhat hard  Food Insecurity: Food Insecurity Present (12/08/2022)   Hunger Vital Sign    Worried About Running Out of Food in the Last Year: Sometimes true    Ran Out of Food in the Last Year: Sometimes true  Transportation Needs: No Transportation Needs (12/08/2022)   PRAPARE - Administrator, Civil Service (Medical): No    Lack of Transportation (Non-Medical): No  Physical Activity: Insufficiently Active (12/08/2022)   Exercise Vital Sign    Days of Exercise per Week: 4 days    Minutes of Exercise per Session: 30 min  Stress: No Stress Concern Present (12/08/2022)   [employer] of Occupational Health - Occupational Stress Questionnaire    Feeling of Stress : Only a little  Social Connections: Socially Integrated (12/08/2022)   Social Connection and Isolation Panel [NHANES]    Frequency of Communication with Friends and Family: More than three times a week    Frequency of Social Gatherings with Friends and Family: Once a week    Attends Religious Services: More than 4 times per year    Active Member of [employer] or Organizations: Yes    Attends Banker Meetings: 1 to 4 times per year    Marital Status: Married    Tobacco Counseling Counseling given: Not Answered   Clinical Intake:  Pre-visit preparation completed: Yes  Pain : No/denies pain Pain Type: Chronic pain Pain Location: Back (left hip) Pain Orientation: Lower Pain Descriptors / Indicators: Aching Pain Onset: More than a month ago Pain Frequency: Intermittent Pain Relieving Factors: aleie,IBU  Pain Relieving Factors: aleie,IBU  BMI - recorded: 28.27 Nutritional Status: BMI 25 -29 Overweight Nutritional Risks: None Diabetes: No  How often do you need to have someone help you when you read instructions, pamphlets, or other written materials from your doctor or pharmacy?: 1 - Never  Interpreter Needed?: No  Comments: lives with husband Information entered by ::  B.Hailei Besser,LPN   Activities of Daily Living    12/08/2022   10:15 AM 02/10/2022   10:59 AM  In your present state of health, do you have any difficulty performing the following activities:  Hearing? 0 0  Vision? 0 1  Difficulty concentrating or making decisions? 0 0  Walking or climbing stairs? 1 1  Dressing or bathing? 0 0  Doing errands, shopping? 0 0  Preparing Food and eating ? N   Using the Toilet? N   In the past six months, have you accidently leaked urine? Y   Do you have problems with loss of bowel control? N   Managing your Medications? N   Managing your Finances? N   Housekeeping or managing your Housekeeping? N  Patient Care Team: Ronnald Ramp, MD as PCP - General (Family Medicine)  Indicate any recent Medical Services you may have received from other than [facility] providers in the past year (date may be approximate).     Assessment:   This is a routine wellness examination for [name].  Hearing/Vision screen Hearing Screening - Comments:: Adequate hearing Vision Screening - Comments:: Adequate vision with glasses [facility]  Dietary issues and exercise activities discussed:     Goals Addressed   None    Depression Screen    12/08/2022   10:11 AM 10/05/2022    3:00 PM 02/10/2022   10:59 AM 10/02/2020    9:54 AM 03/10/2020   10:05 AM 10/01/2019   10:38 AM 03/06/2019    4:02 PM  PHQ 2/9 Scores  PHQ - 2 Score 2 2 0 1 0 2 0  PHQ- 9 Score 4 4 3 5 2 4      Fall Risk    12/08/2022   10:09 AM 10/05/2022    3:00 PM 02/10/2022   10:58 AM 10/02/2020    9:55 AM 03/10/2020   10:05 AM  Fall Risk   Falls in the past year? 0 0 0 0 0  Number falls in past yr: 0 0 0 0 0  Injury with Fall? 0 0 0 0 0  Risk for fall due to : No Fall Risks No Fall Risks     Follow up Falls prevention discussed;Education provided   Falls evaluation completed Falls evaluation completed    MEDICARE RISK AT HOME:  Medicare Risk at Home - 12/08/22 1009     Any stairs in or  around the home? No    If so, are there any without handrails? No    Home free of loose throw rugs in walkways, pet beds, electrical cords, etc? Yes    Adequate lighting in your home to reduce risk of falls? Yes    Life alert? No    Use of a cane, walker or w/c? No    Grab bars in the bathroom? No    Shower chair or bench in shower? Yes    Elevated toilet seat or a handicapped toilet? No             TIMED UP AND GO:  Was the test performed?  Yes  Length of time to ambulate 10 feet: 10 sec Gait steady and fast without use of assistive device    Cognitive Function:        12/08/2022   10:20 AM  6CIT Screen  What Year? 0 points  What month? 0 points  What time? 0 points  Count back from 20 0 points  Months in reverse 0 points  Repeat phrase 0 points  Total Score 0 points    Immunizations Immunization History  Administered Date(s) Administered   Fluad Quad(high Dose 65+) 03/10/2020   Influenza, High Dose Seasonal PF 07/18/2017, 08/02/2018, 03/06/2019   PFIZER(Purple Top)SARS-COV-2 Vaccination 07/15/2019, 08/06/2019   Pneumococcal Conjugate-13 07/15/2016   Pneumococcal Polysaccharide-23 07/18/2017   Tdap 08/02/2018    TDAP status: Up to date  Flu Vaccine status: Up to date  Pneumococcal vaccine status: Up to date  Covid-19 vaccine status: Completed vaccines  Qualifies for Shingles Vaccine? Yes   Zostavax completed Yes  #1 Shingrix Completed?: No.    Education has been provided regarding the importance of this vaccine. Patient has been advised to call insurance company to determine out of pocket expense if they have not yet  received this vaccine. Advised may also receive vaccine at local pharmacy or Health Dept. Verbalized acceptance and understanding.  Screening Tests Health Maintenance  Topic Date Due   Hepatitis C Screening  Never done   Zoster Vaccines- Shingrix (1 of 2) Never done   DEXA SCAN  Never done   COVID-19 Vaccine (3 - Pfizer risk series)  09/03/2019   INFLUENZA VACCINE  01/20/2023   Medicare Annual Wellness (AWV)  12/08/2023   MAMMOGRAM  10/25/2024   DTaP/Tdap/Td (2 - Td or Tdap) 08/02/2028   Colonoscopy  06/11/2029   Pneumonia Vaccine 27+ Years old  Completed   HPV VACCINES  Aged Out    Health Maintenance  Health Maintenance Due  Topic Date Due   Hepatitis C Screening  Never done   Zoster Vaccines- Shingrix (1 of 2) Never done   DEXA SCAN  Never done   COVID-19 Vaccine (3 - Pfizer risk series) 09/03/2019    Colorectal cancer screening: Type of screening: Colonoscopy. Completed yes. Repeat every 10 years  Mammogram status: Completed yes. Repeat every year  Bone Density status: Ordered yes. Pt provided with contact info and advised to call to schedule appt.  Lung Cancer Screening: (Low Dose CT Chest recommended if Age 39-80 years, 20 pack-year currently smoking OR have quit w/in 15years.) does not qualify.   Lung Cancer Screening Referral: no  Additional Screening:  Hepatitis C Screening: does qualify; Completed no  Vision Screening: Recommended annual ophthalmology exams for early detection of glaucoma and other disorders of the eye. Is the patient up to date with their annual eye exam?  Yes  Who is the provider or what is the name of the office in which the patient attends annual eye exams? [facility] If pt is not established with a provider, would they like to be referred to a provider to establish care? No .   Dental Screening: Recommended annual dental exams for proper oral hygiene  Diabetic Foot Exam: n/a  Community Resource Referral / Chronic Care Management: CRR required this visit?  No   CCM required this visit?  No     Plan:     I have personally reviewed and noted the following in the patient's chart:   Medical and social history Use of alcohol, tobacco or illicit drugs  Current medications and supplements including opioid prescriptions. Patient is not currently taking opioid  prescriptions. Functional ability and status Nutritional status Physical activity Advanced directives List of other physicians Hospitalizations, surgeries, and ER visits in previous 12 months Vitals Screenings to include cognitive, depression, and falls Referrals and appointments  In addition, I have reviewed and discussed with patient certain preventive protocols, quality metrics, and best practice recommendations. A written personalized care plan for preventive services as well as general preventive health recommendations were provided to patient.     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 She relays the stressors and needing reprieve.She declines referral for caregiver counseling at this time as she will be talking with the children for options.She will let us know if needed. Pt relays she is unable to get the Zepbound and desires other options.   *Bone Density ordered

## 2022-12-08 NOTE — Patient Instructions (Signed)
[name] , Thank you for taking time to come for your Medicare Wellness Visit. I appreciate your ongoing commitment to your health goals. Please review the following plan we discussed and let me know if I can assist you in the future.   These are the goals we discussed:  Goals   None     This is a list of the screening recommended for you and due dates:  Health Maintenance  Topic Date Due   Hepatitis C Screening  Never done   Zoster (Shingles) Vaccine (1 of 2) Never done   DEXA scan (bone density measurement)  Never done   COVID-19 Vaccine (3 - Pfizer risk series) 09/03/2019   Flu Shot  01/20/2023   Medicare Annual Wellness Visit  12/08/2023   Mammogram  10/25/2024   DTaP/Tdap/Td vaccine (2 - Td or Tdap) 08/02/2028   Colon Cancer Screening  06/11/2029   Pneumonia Vaccine  Completed   HPV Vaccine  Aged Out    Advanced directives: yes  Conditions/risks identified: low falls risk  Next appointment: Follow up in one year for your annual wellness visit 12/11/2023 @1pm  telephone   Preventive Care 65 Years and Older, Female Preventive care refers to lifestyle choices and visits with your health care provider that can promote health and wellness. What does preventive care include? A yearly physical exam. This is also called an annual well check. Dental exams once or twice a year. Routine eye exams. Ask your health care provider how often you should have your eyes checked. Personal lifestyle choices, including: Daily care of your teeth and gums. Regular physical activity. Eating a healthy diet. Avoiding tobacco and drug use. Limiting alcohol use. Practicing safe sex. Taking low-dose aspirin every day. Taking vitamin and mineral supplements as recommended by your health care provider. What happens during an annual well check? The services and screenings done by your health care provider during your annual well check will depend on your age, overall health, lifestyle  risk factors, and family history of disease. Counseling  Your health care provider may ask you questions about your: Alcohol use. Tobacco use. Drug use. Emotional well-being. Home and relationship well-being. Sexual activity. Eating habits. History of falls. Memory and ability to understand (cognition). Work and work Astronomer. Reproductive health. Screening  You may have the following tests or measurements: Height, weight, and BMI. Blood pressure. Lipid and cholesterol levels. These may be checked every 5 years, or more frequently if you are over 68 years old. Skin check. Lung cancer screening. You may have this screening every year starting at age 72 if you have a 30-pack-year history of smoking and currently smoke or have quit within the past 15 years. Fecal occult blood test (FOBT) of the stool. You may have this test every year starting at age 60. Flexible sigmoidoscopy or colonoscopy. You may have a sigmoidoscopy every 5 years or a colonoscopy every 10 years starting at age 72. Hepatitis C blood test. Hepatitis B blood test. Sexually transmitted disease (STD) testing. Diabetes screening. This is done by checking your blood sugar (glucose) after you have not eaten for a while (fasting). You may have this done every 1-3 years. Bone density scan. This is done to screen for osteoporosis. You may have this done starting at age 72. Mammogram. This may be done every 1-2 years. Talk to your health care provider about how often you should have regular mammograms. Talk with your health care provider about your test results, treatment options, and if necessary,  the need for more tests. Vaccines  Your health care provider may recommend certain vaccines, such as: Influenza vaccine. This is recommended every year. Tetanus, diphtheria, and acellular pertussis (Tdap, Td) vaccine. You may need a Td booster every 10 years. Zoster vaccine. You may need this after age 72. Pneumococcal  13-valent conjugate (PCV13) vaccine. One dose is recommended after age 72. Pneumococcal polysaccharide (PPSV23) vaccine. One dose is recommended after age 72. Talk to your health care provider about which screenings and vaccines you need and how often you need them. This information is not intended to replace advice given to you by your health care provider. Make sure you discuss any questions you have with your health care provider. Document Released: 07/04/2015 Document Revised: 02/25/2016 Document Reviewed: 04/08/2015 Elsevier Interactive Patient Education  2017 [place] Prevention in the Home Falls can cause injuries. They can happen to people of all ages. There are many things you can do to make your home safe and to help prevent falls. What can I do on the outside of my home? Regularly fix the edges of walkways and driveways and fix any cracks. Remove anything that might make you trip as you walk through a door, such as a raised step or threshold. Trim any bushes or trees on the path to your home. Use bright outdoor lighting. Clear any walking paths of anything that might make someone trip, such as rocks or tools. Regularly check to see if handrails are loose or broken. Make sure that both sides of any steps have handrails. Any raised decks and porches should have guardrails on the edges. Have any leaves, snow, or ice cleared regularly. Use sand or salt on walking paths during winter. Clean up any spills in your garage right away. This includes oil or grease spills. What can I do in the bathroom? Use night lights. Install grab bars by the toilet and in the tub and shower. Do not use towel bars as grab bars. Use non-skid mats or decals in the tub or shower. If you need to sit down in the shower, use a plastic, non-slip stool. Keep the floor dry. Clean up any water that spills on the floor as soon as it happens. Remove soap buildup in the tub or shower regularly. Attach  bath mats securely with double-sided non-slip rug tape. Do not have throw rugs and other things on the floor that can make you trip. What can I do in the bedroom? Use night lights. Make sure that you have a light by your bed that is easy to reach. Do not use any sheets or blankets that are too big for your bed. They should not hang down onto the floor. Have a firm chair that has side arms. You can use this for support while you get dressed. Do not have throw rugs and other things on the floor that can make you trip. What can I do in the kitchen? Clean up any spills right away. Avoid walking on wet floors. Keep items that you use a lot in easy-to-reach places. If you need to reach something above you, use a strong step stool that has a grab bar. Keep electrical cords out of the way. Do not use floor polish or wax that makes floors slippery. If you must use wax, use non-skid floor wax. Do not have throw rugs and other things on the floor that can make you trip. What can I do with my stairs? Do not leave any items on  the stairs. Make sure that there are handrails on both sides of the stairs and use them. Fix handrails that are broken or loose. Make sure that handrails are as long as the stairways. Check any carpeting to make sure that it is firmly attached to the stairs. Fix any carpet that is loose or worn. Avoid having throw rugs at the top or bottom of the stairs. If you do have throw rugs, attach them to the floor with carpet tape. Make sure that you have a light switch at the top of the stairs and the bottom of the stairs. If you do not have them, ask someone to add them for you. What else can I do to help prevent falls? Wear shoes that: Do not have high heels. Have rubber bottoms. Are comfortable and fit you well. Are closed at the toe. Do not wear sandals. If you use a stepladder: Make sure that it is fully opened. Do not climb a closed stepladder. Make sure that both sides of the  stepladder are locked into place. Ask someone to hold it for you, if possible. Clearly mark and make sure that you can see: Any grab bars or handrails. First and last steps. Where the edge of each step is. Use tools that help you move around (mobility aids) if they are needed. These include: Canes. Walkers. Scooters. Crutches. Turn on the lights when you go into a dark area. Replace any light bulbs as soon as they burn out. Set up your furniture so you have a clear path. Avoid moving your furniture around. If any of your floors are uneven, fix them. If there are any pets around you, be aware of where they are. Review your medicines with your doctor. Some medicines can make you feel dizzy. This can increase your chance of falling. Ask your doctor what other things that you can do to help prevent falls. This information is not intended to replace advice given to you by your health care provider. Make sure you discuss any questions you have with your health care provider. Document Released: 04/03/2009 Document Revised: 11/13/2015 Document Reviewed: 07/12/2014 Elsevier Interactive Patient Education  2017 [employer].

## 2022-12-09 ENCOUNTER — Telehealth: Payer: Self-pay | Admitting: *Deleted

## 2022-12-09 NOTE — Progress Notes (Signed)
  Care Coordination  Outreach Note  12/09/2022 Name: [name] MRN: [id number] DOB: 1950-09-16   Care Coordination Outreach Attempts: An unsuccessful telephone outreach was attempted today to offer the patient information about available care coordination services.  Follow Up Plan:  Additional outreach attempts will be made to offer the patient care coordination information and services.   Encounter Outcome:  No Answer  Burman Nieves, CCMA Care Coordination Care Guide Direct Dial: [phone]

## 2022-12-10 ENCOUNTER — Other Ambulatory Visit: Payer: Medicare (Managed Care) | Admitting: Pharmacist

## 2022-12-10 NOTE — Progress Notes (Signed)
  Care Coordination   Note   12/10/2022 Name: [name] MRN: [id number] DOB: [birth year]  [name] is a 72 y.o. year old female who sees Simmons-Robinson, Tawanna Cooler, MD for primary care. I reached out to Gloriann Riede by phone today to offer care coordination services.  Ms. Borrero was given information about Care Coordination services today including:   The Care Coordination services include support from the care team which includes your Nurse Coordinator, Clinical Social Worker, or Pharmacist.  The Care Coordination team is here to help remove barriers to the health concerns and goals most important to you. Care Coordination services are voluntary, and the patient may decline or stop services at any time by request to their care team member.   Care Coordination Consent Status: Patient agreed to services and verbal consent obtained.   Follow up plan:  Telephone appointment with care coordination team member scheduled for:  12/15/2022  Encounter Outcome:  Pt. Scheduled from referral   Burman Nieves, [facility] Care Coordination Care Guide Direct Dial: [phone]

## 2022-12-10 NOTE — Progress Notes (Signed)
12/10/2022 Name: Tashe Purdon MRN: [id number] DOB: Aug 12, 1950  Chief Complaint  Patient presents with   Medication Assistance    Oviya Ammar is a 72 y.o. year old female who presented for a telephone visit.   They were referred to the pharmacist by their PCP for assistance in managing medication access.   Subjective:  Care Team: Primary Care Provider: Ronnald Ramp, MD ; Next Scheduled Visit: 12/28/2022 Social Worker: Wenda [place], [place]; Next Scheduled Visit: 12/15/2022  Medication Access/Adherence  Current Pharmacy:  [id number] [place], [place] - [address] AT [facility] [address] [facility] [address] & [address] [place] [address] Phone: [phone] Fax: [phone]  Gerri Spore [facility] - [facility] [address] [place] [address] Phone: [phone] Fax: [phone]   Patient reports affordability concerns with their medications: Yes  Patient reports access/transportation concerns to their pharmacy: No  Patient reports adherence concerns with their medications:  No    Patient has been unable to obtain Zepbound as prescribed by PCP on 10/05/2022   Overweight, Complicated by Hyperlipidemia  Current medications: none  Weight Management treatments previously prescribed: Zepbound (cost)  Patient reports that she is interested in losing ~30 lbs  Patient has made dietary changes to reduced carbohydrate intake  Current physical activity: reports exercise is significantly limited by back and hip pain. Reports has a history of back surgery. Patient walks as able  Reports interested in trying Zepbound, [place] or Saxenda if affordable/covered through her insurance  Current medication access support: none   Objective:  Lab Results  Component Value Date   HGBA1C 5.1 10/05/2022    Lab Results  Component Value Date   CREATININE 1.08 (H) 10/05/2022   BUN 15 10/05/2022   NA 142  10/05/2022   K 3.3 (L) 10/05/2022   CL 103 10/05/2022   CO2 23 10/05/2022    Medications Reviewed Today     Reviewed by Ronnald Ramp, MD (Physician) on 10/05/22 at 1518  Med List Status: <None>   Medication Order Taking? Sig Documenting Provider Last Dose Status Informant  allopurinol (ZYLOPRIM) 300 MG tablet [id number] Yes TAKE 1 TABLET(300 MG) BY MOUTH DAILYTAKE 1 TABLET(300 MG) BY MOUTH DAILY Ostwalt, Janna, PA-C Taking Active   Ascorbic Acid (VITAMIN C) 500 MG CHEW [id number] Yes Chew 2 tablets by mouth daily. [provider] Taking Active   celecoxib (CELEBREX) 200 MG capsule [id number] Yes Take 200 mg by mouth daily. [provider] Taking Active   Cholecalciferol (VITAMIN D-1000 MAX ST) 25 MCG (1000 UT) tablet [id number] Yes Take 1 tablet by mouth daily. [provider] Taking Active   gabapentin (NEURONTIN) 100 MG capsule [id number] Yes TAKE 1 CAPSULE(100 MG) BY MOUTH AT BEDTIME Bosie Clos, MD Taking Active   hydroquinone 4 % cream [id number] Yes Apply to dark spots twice a day until improved. Willeen Niece, MD Taking Active   ipratropium (ATROVENT) 0.03 % nasal spray [id number]  Place 2 sprays into both nostrils every 12 (twelve) hours. Debera Lat, PA-C  Active   levothyroxine (SYNTHROID) 75 MCG tablet [id number] Yes Take 1 tablet daily. Please schedule office visit before any future refill. Debera Lat, PA-C Taking Active   meloxicam (MOBIC) 7.5 MG tablet [id number]  Take 1 tablet (7.5 mg total) by mouth 2 (two) times daily as needed for pain.  Patient not taking: Reported on 08/17/2022   Bosie Clos, MD  Active   Multiple Vitamins-Minerals (ZINC PO) [id number] Yes Take  by mouth. [provider] Taking Active   Naproxen Sodium 220 MG CAPS [id number] Yes Take 1 tablet by mouth at bedtime as needed. [provider] Taking Active   omeprazole (PRILOSEC) 20 MG capsule [id number] Yes Take 1 capsule (20 mg total) by mouth  daily. Simmons-Robinson, Tawanna Cooler, MD Taking Active   simvastatin (ZOCOR) 40 MG tablet [id number] Yes TAKE 1 TABLET(40 MG) BY MOUTH DAILY Ostwalt, Janna, PA-C Taking Active   triamterene-hydrochlorothiazide (DYAZIDE) 37.5-25 MG capsule [id number] Yes TAKE 1 CAPSULE BY MOUTH EVERY DAY Ostwalt, Janna, PA-C Taking Active   zolpidem (AMBIEN) 5 MG tablet [id number] Yes Take 1 tablet (5 mg total) by mouth at bedtime as needed for sleep. Debera Lat, PA-C Taking Active              Assessment/Plan:   Overweight, Complicated by Hyperlipidemia: - Currently unable to achieve goal weight loss of 5-10% through diet and lifestyle modifications alone - Based on review of formulary for patient's [facility] plan per [employer], [place] and Saxenda are not covered options through patient's health plan Attempt to complete Prior authorization for Zepbound through covermymeds for patient and reach notification that [facility] or Saxenda preferred for patient's plan over Zepbound and may be covered with prior authorization (PA) Submit PA request for [facility] via covermymeds today   Follow Up Plan: Will follow up for outcome of [place] prior authorization within the next 7 days.  Estelle Grumbles, PharmD, [facility] [phone]

## 2022-12-10 NOTE — Patient Instructions (Addendum)
I have submitted a prior authorization (PA) request to your insurance on your behalf. Will follow up with you regarding the outcome once we receive a response.  Please let me know if you have any further medication questions or concerns.  Thank you!  Estelle Grumbles, PharmD, [facility], [facility], [facility] [phone]

## 2022-12-14 NOTE — Progress Notes (Signed)
   12/14/2022  Patient ID: [name], female   DOB: 06/18/1951, 72 y.o.   MRN: [id number]  Follow up regarding PA for [facility] via Covermymeds. PA for [facility] has been denied. Patient does not meet criteria for approval. Per message, under Medicare Part D benefit not covered for weight loss, only covered "to reduce the risk of major adverse cardiovascular events (cardiovascular death, non-fatal myocardial infarction, or non-fatal stroke) in adults with established cardiovascular disease (as evidenced by a prior myocardial infarction, prior stroke, or peripheral arterial disease) and either obesity or overweight"  Patient notified.  Patient denies further medication questions or concerns Provide patient with contact information for clinic pharmacist to contact if needed in future for medication questions/concerns   Estelle Grumbles, PharmD, [facility] [phone]

## 2022-12-15 ENCOUNTER — Ambulatory Visit: Payer: Self-pay | Admitting: *Deleted

## 2022-12-15 NOTE — Patient Outreach (Signed)
  Care Coordination   Initial Visit Note   12/15/2022 Name: [name] MRN: [id number] DOB: May 05, 1951  [name] is a 72 y.o. year old female who sees Simmons-Robinson, Tawanna Cooler, MD for primary care. I spoke with  Shelba Flake by phone today.  What matters to the patients health and wellness today?  Patient contact to provide resources for respite care. Needs assessment completed, respite care resources  provided. Patient declined having any additional community resource needs at this time. This social worker's contact information provided for use in the future If needed.   Goals Addressed             This Visit's Progress    Caregiver support       Interventions Today    Flowsheet Row Most Recent Value  Chronic Disease   Chronic disease during today's visit Other  [caregiver stress]  General Interventions   General Interventions Discussed/Reviewed General Interventions Discussed, [facility] of caregiver stress discussed -options reviewed for respite care for spouse, both in home and community-natural supports confirmed- neighbors/family are postive supports but limited]  Mental Health Interventions   Mental Health Discussed/Reviewed Mental Health Discussed, Coping Strategies  [self care emphasized- discussed plan to go to the pool, participate in neighborhood activites, church 2x per week]  Nutrition Interventions   Nutrition Discussed/Reviewed Nutrition Discussed  Safety Interventions   Safety Discussed/Reviewed Safety Discussed  [patient states that she has cameras installed to increase safety for spouse]              SDOH assessments and interventions completed:  Yes  SDOH Interventions Today    Flowsheet Row Most Recent Value  SDOH Interventions   Food Insecurity Interventions Intervention Not Indicated  Housing Interventions Intervention Not Indicated  Transportation Interventions Intervention Not Indicated   Depression Interventions/Treatment  Patient refuses Treatment  [patient declined mental health resources at this time]        Care Coordination Interventions:  Yes, provided   Follow up plan: No further intervention required.   Encounter Outcome:  Pt. Visit Completed

## 2022-12-15 NOTE — Patient Instructions (Signed)
Visit Information  Thank you for taking time to visit with me today. Please don't hesitate to contact me if I can be of assistance to you.   Following are the goals we discussed today:  Please contact this social worker with any additional community resource needs that may arise   If you are experiencing a Mental Health or Behavioral Health Crisis or need someone to talk to, please call 911   Patient verbalizes understanding of instructions and care plan provided today and agrees to view in MyChart. Active MyChart status and patient understanding of how to access instructions and care plan via MyChart confirmed with patient.     No further follow up required: patient encouraged to contact this Child psychotherapist with any additional community resource needs  [employer], LCSW Clinical Social Worker  [facility] [phone]    `

## 2022-12-28 ENCOUNTER — Encounter: Payer: Medicare (Managed Care) | Admitting: Family Medicine

## 2023-01-04 ENCOUNTER — Encounter: Payer: Medicare (Managed Care) | Admitting: Family Medicine

## 2023-01-05 ENCOUNTER — Ambulatory Visit: Payer: Medicare (Managed Care) | Admitting: Dermatology

## 2023-01-05 VITALS — BP 103/67

## 2023-01-05 DIAGNOSIS — Z85828 Personal history of other malignant neoplasm of skin: Secondary | ICD-10-CM | POA: Diagnosis not present

## 2023-01-05 DIAGNOSIS — L905 Scar conditions and fibrosis of skin: Secondary | ICD-10-CM

## 2023-01-05 DIAGNOSIS — L821 Other seborrheic keratosis: Secondary | ICD-10-CM | POA: Diagnosis not present

## 2023-01-05 DIAGNOSIS — R238 Other skin changes: Secondary | ICD-10-CM

## 2023-01-05 DIAGNOSIS — L820 Inflamed seborrheic keratosis: Secondary | ICD-10-CM | POA: Diagnosis not present

## 2023-01-05 DIAGNOSIS — L309 Dermatitis, unspecified: Secondary | ICD-10-CM | POA: Diagnosis not present

## 2023-01-05 DIAGNOSIS — L814 Other melanin hyperpigmentation: Secondary | ICD-10-CM

## 2023-01-05 DIAGNOSIS — D239 Other benign neoplasm of skin, unspecified: Secondary | ICD-10-CM

## 2023-01-05 DIAGNOSIS — D2371 Other benign neoplasm of skin of right lower limb, including hip: Secondary | ICD-10-CM

## 2023-01-05 MED ORDER — TACROLIMUS 0.1 % EX OINT: TOPICAL_OINTMENT | Freq: Two times a day (BID) | CUTANEOUS | 1 refills | Status: DC

## 2023-01-05 NOTE — Progress Notes (Signed)
Follow-Up Visit   Subjective  [name] is a 72 y.o. female who presents for the following: Lentigo vs SK L foot dorsum recheck, uses Hydroquinone prn, Isk L  frontal scalp to check, dry spots bil lower legs 81m itchy prn, check spot L eye, dry and itchy, itchy spot on chest.  The patient has spots, moles and lesions to be evaluated, some may be new or changing and the patient may have concern these could be cancer.    The following portions of the chart were reviewed this encounter and updated as appropriate: medications, allergies, medical history  Review of Systems:  No other skin or systemic complaints except as noted in HPI or Assessment and Plan.  Objective  Well appearing patient in no apparent distress; mood and affect are within normal limits.   A focused examination was performed of the following areas: L foot, bil lower legs, scalp, face, trunk, arms  Relevant exam findings are noted in the Assessment and Plan.  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  Inflamed seborrheic keratosis (2) chest x 1; L frontal scalp  Patient declines treatment on scalp  .   Destruction of lesion - chest x 1  Destruction method: cryotherapy   Informed consent: discussed and consent obtained   Lesion destroyed using liquid nitrogen: Yes   Region frozen until ice ball extended beyond lesion: Yes   Outcome: patient tolerated procedure well with no complications   Post-procedure details: wound care instructions given   Additional details:  Prior to procedure, discussed risks of blister formation,  small wound, skin dyspigmentation, or rare scar following cryotherapy. Recommend Vaseline ointment to treated areas while healing.    HISTORY OF SQUAMOUS CELL CARCINOMA OF THE SKIN - No evidence of recurrence today - Recommend regular full body skin exams - Recommend daily broad spectrum sunscreen SPF 30+ to sun-exposed areas, reapply every 2 hours as needed.  - Call if any new or changing lesions are noted between office visits - L medial calf excised 05/27/20 with resulting scar  SCAR, irritated (with possible Psoriasis/Dermatitis- pt has h/o psoriasis)  L medial calf Exam: hyperpigmented patch with central pink scaly slightly indurated plaque, see photo  Chronic and persistent condition with duration or expected duration over one year. Condition is bothersome/symptomatic for patient. Currently flared.   Treatment:   Start TMC 0.1% cr bid for 2 wks then d/c, avoid f/g/a In 2 weeks start Tacrolimus 0.1% ointment qd/bid aa L medial calf  Start Amlactin cream qd   Topical steroids (such as triamcinolone, fluocinolone, fluocinonide, mometasone, clobetasol, halobetasol, betamethasone, hydrocortisone) can cause thinning and lightening of the skin if they are used for too long in the same area. Your physician has selected the right strength medicine for your problem and area affected on the body. Please use your medication only as directed by your physician to prevent side effects.    PERIOCULAR DERMATITIS L periocular Exam: pink scaly patch L upper eyelid and lat canthus   Treatment Plan: Start Tacrolimus 0.1% ointment qd/bid until clear, then prn flares  DERMATOFIBROMA Exam: Firm pink/brown papulenodule with dimple sign. Treatment Plan: A dermatofibroma is a benign growth possibly related to trauma, such as an insect bite, cut from shaving, or inflamed acne-type bump.  Treatment options to remove include shave or excision with resulting scar and risk of recurrence.  Since  benign-appearing and not bothersome, will observe for now.   - R ankle  SEBORRHEIC KERATOSIS - Stuck-on, waxy, tan-brown papules and/or plaques  - Benign-appearing - Discussed benign etiology and prognosis. 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 Condition is bothersome/symptomatic for patient. Currently flared.   Treatment Plan: Start TMC 0.1% cr bid for 2 wks then d/c In 2 weeks start Tacrolimus 0.1% ointment qd/bid until clear, then prn flares Start Amlactin cr qd  Recommend gentle skin care   Return in about 6 months (around 07/08/2023) for TBSE, Hx of Melanoma, Hx of BCC, Hx of SCC, Hx of Dysplastic nevi, Hx of AKs.  I, Ardis Rowan, RMA, am acting as scribe for Willeen Niece, MD .   Documentation: I have reviewed the above documentation for accuracy and completeness, and I agree with the above.  Willeen Niece, MD

## 2023-01-05 NOTE — Patient Instructions (Addendum)
    The CDC recommends two doses of Shingrix (the shingles vaccine) separated by 2 to 6 months for adults age 72 years and older. I recommend checking with your insurance plan regarding coverage for this vaccine.   

## 2023-01-05 NOTE — Patient Instructions (Addendum)
For scar on left calf and dry spots on lower legs Start Triamcinoline 0.1% cream 2 times a day  for 2 wks then stop, avoid face, groin, axilla In 2 weeks start Tacrolimus 0.1% ointment 1 to 2 times a day to  Left medial calf  Start Amlactin cream daily  For Dermatitis on eyelid Start Tacrolimus 0.1% ointment 1 -2 times a day until clear then as needed for flares   Gentle Skin Care Guide  1. Bathe no more than once a day.  2. Avoid bathing in hot water  3. Use a mild soap like Dove, Vanicream, Cetaphil, CeraVe. Can use Lever 2000 or Cetaphil antibacterial soap  4. Use soap only where you need it. On most days, use it under your arms, between your legs, and on your feet. Let the water rinse other areas unless visibly dirty.  5. When you get out of the bath/shower, use a towel to gently blot your skin dry, don't rub it.  6. While your skin is still a little damp, apply a moisturizing cream such as Vanicream, CeraVe, Cetaphil, Eucerin, Sarna lotion or plain Vaseline Jelly. For hands apply Neutrogena [place] Hand Cream or Excipial Hand Cream.  7. Reapply moisturizer any time you start to itch or feel dry.  8. Sometimes using free and clear laundry detergents can be helpful. Fabric softener sheets should be avoided. Downy Free & Gentle liquid, or any liquid fabric softener that is free of dyes and perfumes, it acceptable to use  9. If your doctor has given you prescription creams you may apply moisturizers over them   Cryotherapy Aftercare  Wash gently with soap and water everyday.   Apply Vaseline and Band-Aid daily until healed.   Due to recent changes in healthcare laws, you may see results of your pathology and/or laboratory studies on MyChart before the doctors have had a chance to review them. We understand that in some cases there may be results that are confusing or concerning to you. Please understand that not all results are received at the same time and often the doctors may  need to interpret multiple results in order to provide you with the best plan of care or course of treatment. Therefore, we ask that you please give [place] 2 business days to thoroughly review all your results before contacting the office for clarification. Should we see a critical lab result, you will be contacted sooner.   If You Need Anything After Your Visit  If you have any questions or concerns for your doctor, please call our main line at [phone] and press option 4 to reach your doctor's medical assistant. If no one answers, please leave a voicemail as directed and we will return your call as soon as possible. Messages left after 4 pm will be answered the following business day.   You may also send [place] a message via MyChart. We typically respond to MyChart messages within 1-2 business days.  For prescription refills, please ask your pharmacy to contact our office. Our fax number is [phone].  If you have an urgent issue when the clinic is closed that cannot wait until the next business day, you can page your doctor at the number below.    Please note that while we do our best to be available for urgent issues outside of office hours, we are not available 24/7.   If you have an urgent issue and are unable to reach [place], you may choose to seek medical care at your  doctor's office, retail clinic, urgent care center, or emergency room.  If you have a medical emergency, please immediately call 911 or go to the emergency department.  Pager Numbers  - Dr. Gwen Pounds: [phone]  - Dr. Neale Burly: [phone]  - Dr. Roseanne Reno: [phone]  In the event of inclement weather, please call our main line at [phone] for an update on the status of any delays or closures.  Dermatology Medication Tips: Please keep the boxes that topical medications come in in order to help keep track of the instructions about where and how to use these. Pharmacies typically print the medication instructions only  on the boxes and not directly on the medication tubes.   If your medication is too expensive, please contact our office at [phone] option 4 or send [place] a message through MyChart.   We are unable to tell what your co-pay for medications will be in advance as this is different depending on your insurance coverage. However, we may be able to find a substitute medication at lower cost or fill out paperwork to get insurance to cover a needed medication.   If a prior authorization is required to get your medication covered by your insurance company, please allow [place] 1-2 business days to complete this process.  Drug prices often vary depending on where the prescription is filled and some pharmacies may offer cheaper prices.  The website www.goodrx.com contains coupons for medications through different pharmacies. The prices here do not account for what the cost may be with help from insurance (it may be cheaper with your insurance), but the website can give you the price if you did not use any insurance.  - You can print the associated coupon and take it with your prescription to the pharmacy.  - You may also stop by our office during regular business hours and pick up a GoodRx coupon card.  - If you need your prescription sent electronically to a different pharmacy, notify our office through [facility] or by phone at [phone] option 4.     Si Usted Necesita Algo Despus de Su Visita  Tambin puede enviarnos un mensaje a travs de Clinical cytogeneticist. Por lo general respondemos a los mensajes de MyChart en el transcurso de 1 a 2 das hbiles.  Para renovar recetas, por favor pida a su farmacia que se ponga en contacto con nuestra oficina. Annie Sable de fax es [place] [phone].  Si tiene un asunto urgente cuando la clnica est cerrada y que no puede esperar hasta el siguiente da hbil, puede llamar/localizar a su doctor(a) al nmero que aparece a continuacin.   Por favor, tenga en cuenta  que aunque hacemos todo lo posible para estar disponibles para asuntos urgentes fuera del horario de [place], no estamos disponibles las 24 horas del da, los 7 [address]  [address] de la [place].   Si tiene un problema urgente y no puede comunicarse con nosotros, puede optar por buscar atencin mdica  en el consultorio de su doctor(a), en una clnica privada, en un centro de atencin urgente o en una sala de emergencias.  Si tiene Engineer, drilling, por favor llame inmediatamente al 911 o vaya a la sala de emergencias.  Nmeros de bper  - Dr. Gwen Pounds: [phone]  - Dra. Moye: [phone]  - Dra. Roseanne Reno: [phone]  En caso de inclemencias del [place], por favor llame a Lacy Duverney principal al [phone] para una actualizacin sobre el [place] de cualquier retraso o cierre.  Consejos para la medicacin en dermatologa: Por  favor, guarde las cajas en las que vienen los medicamentos de uso tpico para ayudarle a seguir las instrucciones sobre dnde y cmo usarlos. Las farmacias generalmente imprimen las instrucciones del medicamento slo en las cajas y no directamente en los tubos del [place].   Si su medicamento es muy caro, por favor, pngase en contacto con Rolm Gala llamando al [phone] y presione la opcin 4 o envenos un mensaje a travs de Clinical cytogeneticist.   No podemos decirle cul ser su copago por los medicamentos por adelantado ya que esto es diferente dependiendo de la cobertura de su seguro. Sin embargo, es posible que podamos encontrar un medicamento sustituto a Audiological scientist un formulario para que el seguro cubra el medicamento que se considera necesario.   Si se requiere una autorizacin previa para que su compaa de seguros [place] su medicamento, por favor permtanos de 1 a 2 das hbiles para completar [address]  Los precios de los medicamentos varan con frecuencia dependiendo del Environmental consultant de dnde se surte la receta y alguna farmacias pueden ofrecer precios ms  baratos.  El sitio web www.goodrx.com tiene cupones para medicamentos de Health and safety inspector. Los precios aqu no tienen en cuenta lo que podra costar con la ayuda del seguro (puede ser ms barato con su seguro), pero el sitio web puede darle el precio si no utiliz Tourist information centre manager.  - Puede imprimir el cupn correspondiente y llevarlo con su receta a la farmacia.  - Tambin puede pasar por nuestra oficina durante el horario de atencin regular y Education officer, museum una tarjeta de cupones de GoodRx.  - Si necesita que su receta se enve electrnicamente a una farmacia diferente, informe a nuestra oficina a travs de MyChart de  o por telfono llamando al [phone] y presione la opcin 4.

## 2023-01-05 NOTE — Progress Notes (Signed)
Complete physical exam   Patient: [name]   DOB: 1950-12-10   72 y.o. Female  MRN: [id number] Visit Date: 01/06/2023  Today's healthcare provider: Ronnald Ramp, MD   Chief Complaint  Patient presents with   Annual Exam    Patient reports consuming a general diet watching her carb intake when possible. She reports going walking all the time as exercise. Patient reports feeling well and sleeping fairly well. She reports no other concerns today.   Dexa scan scheduled for 02/15/23   Subjective    [name] is a 72 y.o. female who presents today for a complete physical exam.   Discussed the use of AI scribe software for clinical note transcription with the patient, who gave verbal consent to proceed.  History of Present Illness   The patient, with a history of shingles, presents for a routine physical. She reports a previous episode of shingles around 2015 or 2016, which was relatively mild and not as painful as expected. She recalls receiving the first dose of the shingles vaccine but missed the second dose. She is considering getting the vaccine again, despite potential flu-like side effects.  The patient also reports taking Ambien, a quarter of a pill, almost daily to aid sleep. She finds this dosage sufficient and rarely takes half or a full pill. The need for Ambien is primarily due to her partner's sleep apnea and nocturnal disturbances, which often wake her up. She also mentions neuropathy, which causes her feet to start burning at night. She is currently on gabapentin for this, which she believes helps, but taking 200mg  makes her dizzy, so she sticks to 100mg .  The patient also mentions a recent diagnosis of psoriasis and a history of cataract surgery with Toric lens implants. She has noticed some weight loss, which she attributes to stress from caring for her partner and her dog, who recently broke its jaw and is nearing the end of its life.  She expresses a need for a break and is considering options for respite care for her partner.       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 Excised 05/27/2020, margins free.   Past Surgical History:  Procedure Laterality Date   ABDOMINAL HYSTERECTOMY     BACK SURGERY     BREAST CYST ASPIRATION Left    Neg   COLONOSCOPY WITH PROPOFOL N/A 06/12/2019   Procedure: COLONOSCOPY WITH PROPOFOL;  Surgeon: Midge Minium, MD;  Location: [facility] ENDOSCOPY;  Service: Endoscopy;  Laterality: N/A;   EYE SURGERY     cataracts   FOOT SURGERY     FRACTURE SURGERY  1970   Broke nose   MELANOMA EXCISION     SPINE SURGERY  2008   Social History   Socioeconomic History   Marital status: Married    Spouse name: Not on file   Number of children: Not on file   Years of education: Not on file  Highest education level: Associate degree: occupational, Scientist, product/process development, or vocational program  Occupational History   Not on file  Tobacco Use   Smoking status: Never   Smokeless tobacco: Never  Vaping Use   Vaping status: Never Used  Substance and Sexual Activity   Alcohol use: Yes   Drug use: Not on file   Sexual activity: Not Currently  Other Topics Concern   Not on file  Social History Narrative   Not on file   Social Determinants of Health   Financial Resource Strain: Medium Risk (12/08/2022)   Overall Financial Resource Strain (CARDIA)    Difficulty of Paying Living Expenses: Somewhat hard  Food Insecurity: Food Insecurity Present (12/15/2022)   Hunger  Vital Sign    Worried About Running Out of Food in the Last Year: Sometimes true    Ran Out of Food in the Last Year: Sometimes true  Transportation Needs: No Transportation Needs (12/15/2022)   PRAPARE - Administrator, Civil Service (Medical): No    Lack of Transportation (Non-Medical): No  Physical Activity: Insufficiently Active (12/08/2022)   Exercise Vital Sign    Days of Exercise per Week: 4 days    Minutes of Exercise per Session: 30 min  Stress: No Stress Concern Present (12/08/2022)   [employer] of Occupational Health - Occupational Stress Questionnaire    Feeling of Stress : Only a little  Social Connections: Socially Integrated (12/08/2022)   Social Connection and Isolation Panel [NHANES]    Frequency of Communication with Friends and Family: More than three times a week    Frequency of Social Gatherings with Friends and Family: Once a week    Attends Religious Services: More than 4 times per year    Active Member of [employer] or Organizations: Yes    Attends Banker Meetings: 1 to 4 times per year    Marital Status: Married  Catering manager Violence: Not At Risk (12/08/2022)   Humiliation, Afraid, Rape, and Kick questionnaire    Fear of Current or Ex-Partner: No    Emotionally Abused: No    Physically Abused: No    Sexually Abused: No   Family Status  Relation Name Status   Mother Engineer, manufacturing systems (Not Specified)   Father Tressia Miners (Not Specified)   Sister Mother (Not Specified)  No partnership data on file   Family History  Problem Relation Age of Onset   Breast cancer Mother 93   Arthritis Mother    Early death Mother    Hearing loss Father    Cancer Sister    No Known Allergies   Medications: Outpatient Medications Prior to Visit  Medication Sig   allopurinol (ZYLOPRIM) 300 MG tablet TAKE 1 TABLET(300 MG) BY MOUTH DAILYTAKE 1 TABLET(300 MG) BY MOUTH DAILY   Ascorbic Acid (VITAMIN C) 500 MG CHEW Chew 2 tablets by mouth daily.    celecoxib (CELEBREX) 200 MG capsule Take 200 mg by mouth daily.   Cholecalciferol (VITAMIN D-1000 MAX ST) 25 MCG (1000 UT) tablet Take 1 tablet by mouth daily.   gabapentin (NEURONTIN) 100 MG capsule TAKE 1 CAPSULE(100 MG) BY MOUTH AT BEDTIME   hydroquinone 4 % cream Apply to dark spots twice a day until improved.   levothyroxine (SYNTHROID) 75 MCG tablet TAKE 1 TABLET BY MOUTH DAILY   Multiple Vitamins-Minerals (ZINC PO) Take by mouth.   Naproxen Sodium 220 MG CAPS Take 1 tablet by mouth at bedtime as needed.   omeprazole (PRILOSEC) 20 MG capsule Take  1 capsule (20 mg total) by mouth daily.   simvastatin (ZOCOR) 40 MG tablet TAKE 1 TABLET(40 MG) BY MOUTH DAILY   tacrolimus (PROTOPIC) 0.1 % ointment Apply topically 2 (two) times daily. Bid to scar on left calf and rash around left eye until clear, then prn flares   triamterene-hydrochlorothiazide (DYAZIDE) 37.5-25 MG capsule TAKE 1 CAPSULE BY MOUTH EVERY DAY   [DISCONTINUED] zolpidem (AMBIEN) 5 MG tablet Take 1 tablet (5 mg total) by mouth at bedtime as needed for sleep.   potassium chloride (KLOR-CON M) 10 MEQ tablet Take 2 tablets (20 mEq total) by mouth daily for 5 days.   tirzepatide (ZEPBOUND) 2.5 MG/0.5ML Pen Inject 2.5 mg into the skin once a week. (Patient not taking: Reported on 12/10/2022)   No facility-administered medications prior to visit.    Review of Systems      Objective    BP 104/70 (BP Location: Left Arm, Patient Position: Sitting, Cuff Size: Normal)   Pulse 87   Ht 5\' 4"  (1.626 m)   Wt 159 lb (72.1 kg)   LMP  (LMP Unknown) Comment: 1980s  BMI 27.29 kg/m       Physical Exam Vitals reviewed.  Constitutional:      General: She is not in acute distress.    Appearance: Normal appearance. She is not ill-appearing, toxic-appearing or diaphoretic.  HENT:     Head: Normocephalic and atraumatic.     Right Ear: Tympanic membrane and external ear normal. There is no impacted cerumen.     Left Ear: Tympanic  membrane and external ear normal. There is no impacted cerumen.     Nose: Nose normal.     Mouth/Throat:     Pharynx: Oropharynx is clear.  Eyes:     General: No scleral icterus.    Extraocular Movements: Extraocular movements intact.     Conjunctiva/sclera: Conjunctivae normal.     Pupils: Pupils are equal, round, and reactive to light.  Cardiovascular:     Rate and Rhythm: Normal rate and regular rhythm.     Pulses: Normal pulses.     Heart sounds: Normal heart sounds. No murmur heard.    No friction rub. No gallop.  Pulmonary:     Effort: Pulmonary effort is normal. No respiratory distress.     Breath sounds: Normal breath sounds. No wheezing, rhonchi or rales.  Abdominal:     General: Bowel sounds are normal. There is no distension.     Palpations: Abdomen is soft. There is no mass.     Tenderness: There is no abdominal tenderness. There is no guarding.  Musculoskeletal:        General: No deformity.     Cervical back: Normal range of motion and neck supple. No rigidity.     Right lower leg: No edema.     Left lower leg: No edema.     Comments: Decreased grip strength in bilateral hands   Lymphadenopathy:     Cervical: No cervical adenopathy.  Skin:    General: Skin is warm.     Capillary Refill: Capillary refill takes less than 2 seconds.     Findings: Erythema and rash present.  Neurological:     General: No focal deficit present.     Mental Status: She is alert and oriented to person, place, and time.     Motor: No weakness.     Gait: Gait normal.  Psychiatric:        Mood and Affect: Mood normal.  Behavior: Behavior normal.       Last depression screening scores    01/06/2023    8:45 AM 12/15/2022    3:21 PM 12/08/2022   10:11 AM  PHQ 2/9 Scores  PHQ - 2 Score 0 2 2  PHQ- 9 Score  4 4    Last fall risk screening    01/06/2023    8:45 AM  Fall Risk   Falls in the past year? 0  Injury with Fall? 0  Risk for fall due to : No Fall Risks  Follow up  Falls evaluation completed    Last Audit-C alcohol use screening    12/08/2022   10:11 AM  Alcohol Use Disorder Test (AUDIT)  1. How often do you have a drink containing alcohol? 0  2. How many drinks containing alcohol do you have on a typical day when you are drinking? 0  3. How often do you have six or more drinks on one occasion? 0  AUDIT-C Score 0   A score of 3 or more in women, and 4 or more in men indicates increased risk for alcohol abuse, EXCEPT if all of the points are from question 1   No results found for any visits on 01/06/23.  Assessment & Plan    Routine Health Maintenance and Physical Exam  Immunization History  Administered Date(s) Administered   Fluad Quad(high Dose 65+) 03/10/2020   Influenza, High Dose Seasonal PF 07/18/2017, 08/02/2018, 03/06/2019   Influenza-Unspecified 04/24/2021   PFIZER(Purple Top)SARS-COV-2 Vaccination 07/15/2019, 08/06/2019   Pneumococcal Conjugate-13 07/15/2016   Pneumococcal Polysaccharide-23 07/18/2017   Tdap 08/02/2018    Health Maintenance  Topic Date Due   Hepatitis C Screening  Never done   Zoster Vaccines- Shingrix (1 of 2) Never done   DEXA SCAN  Never done   COVID-19 Vaccine (3 - Pfizer risk series) 09/03/2019   INFLUENZA VACCINE  01/20/2023   Medicare Annual Wellness (AWV)  12/08/2023   MAMMOGRAM  10/25/2024   DTaP/Tdap/Td (2 - Td or Tdap) 08/02/2028   Colonoscopy  06/11/2029   Pneumonia Vaccine 61+ Years old  Completed   HPV VACCINES  Aged Out    Problem List Items Addressed This Visit     Annual physical exam - Primary    Chronic conditions are stable  Patient was counseled on benefits of regular physical activity with goal of 150 minutes of moderate to vigurous intensity 4 days per week  Patient was counseled to consume well balanced diet of fruits, vegetables, limited saturated fats and limited sugary foods and beverages with emphasis on consuming 6-8 glasses of water daily  Screening recommended today:  lipids,Hep C Vaccines recommended today: COVID,Shingrix      Gout, unspecified   Relevant Orders   Uric acid   Hypothyroidism, unspecified   Relevant Orders   TSH+T4F+T3Free   Neuropathy    Reports burning sensation in feet, especially at night. Currently on Gabapentin 100mg , which seems to help. Reports 200mg  causes dizziness. -Chronic, stable on gabapentin  -Continue Gabapentin 100mg  for neuropathy.      Other hyperlipidemia   Relevant Orders   Lipid panel   Overweight with body mass index (BMI) of 28 to 28.9 in adult    BMI 28, now 27.29  Patient has been able to increase physical activity and is down 5 pounds since last visit  Will send another prescription for zep bound 2.5mg  weekly as patient was unable to pick up original prescription  Relevant Orders   CMP14+EGFR   Primary insomnia    Chronic  Stable  Does well on 1/4 pill of 5mg  Ambien at bedtime  Refill sent to pharmacy       Relevant Medications   zolpidem (AMBIEN) 5 MG tablet   Other Visit Diagnoses     Screening for deficiency anemia       Relevant Orders   CBC   Screening for lipid disorders       Encounter for hepatitis C screening test for low risk patient       Relevant Orders   Hepatitis C antibody        General Health Maintenance: -Order lab work today. -Consider strategies for caregiver respite and self-care, given the patient's role in caring for her husband.        Return in about 6 months (around 07/09/2023) for CHRONIC F/U, Weight MGMT.       Ronnald Ramp, MD  [facility] [phone] (phone) [phone] (fax)  [facility]

## 2023-01-06 ENCOUNTER — Encounter: Payer: Self-pay | Admitting: Family Medicine

## 2023-01-06 ENCOUNTER — Ambulatory Visit (INDEPENDENT_AMBULATORY_CARE_PROVIDER_SITE_OTHER): Payer: Medicare (Managed Care) | Admitting: Family Medicine

## 2023-01-06 VITALS — BP 104/70 | HR 87 | Ht 64.0 in | Wt 159.0 lb

## 2023-01-06 DIAGNOSIS — Z6828 Body mass index (BMI) 28.0-28.9, adult: Secondary | ICD-10-CM

## 2023-01-06 DIAGNOSIS — M1A00X0 Idiopathic chronic gout, unspecified site, without tophus (tophi): Secondary | ICD-10-CM

## 2023-01-06 DIAGNOSIS — Z130 Encounter for screening for diseases of the blood and blood-forming organs and certain disorders involving the immune mechanism: Secondary | ICD-10-CM

## 2023-01-06 DIAGNOSIS — Z13220 Encounter for screening for lipoid disorders: Secondary | ICD-10-CM

## 2023-01-06 DIAGNOSIS — G629 Polyneuropathy, unspecified: Secondary | ICD-10-CM | POA: Diagnosis not present

## 2023-01-06 DIAGNOSIS — E038 Other specified hypothyroidism: Secondary | ICD-10-CM

## 2023-01-06 DIAGNOSIS — F5101 Primary insomnia: Secondary | ICD-10-CM | POA: Diagnosis not present

## 2023-01-06 DIAGNOSIS — E7849 Other hyperlipidemia: Secondary | ICD-10-CM | POA: Diagnosis not present

## 2023-01-06 DIAGNOSIS — Z1159 Encounter for screening for other viral diseases: Secondary | ICD-10-CM | POA: Diagnosis not present

## 2023-01-06 DIAGNOSIS — E663 Overweight: Secondary | ICD-10-CM | POA: Diagnosis not present

## 2023-01-06 DIAGNOSIS — Z0000 Encounter for general adult medical examination without abnormal findings: Secondary | ICD-10-CM | POA: Insufficient documentation

## 2023-01-06 MED ORDER — TIRZEPATIDE-WEIGHT MANAGEMENT 2.5 MG/0.5ML ~~LOC~~ SOAJ: 2.5000 mg | SUBCUTANEOUS | 0 refills | Status: DC

## 2023-01-06 MED ORDER — ZOLPIDEM TARTRATE 5 MG PO TABS: 5.0000 mg | ORAL_TABLET | Freq: Every evening | ORAL | 0 refills | Status: DC | PRN

## 2023-01-06 NOTE — Assessment & Plan Note (Signed)
BMI 28, now 27.29  Patient has been able to increase physical activity and is down 5 pounds since last visit  Will send another prescription for zep bound 2.5mg  weekly as patient was unable to pick up original prescription

## 2023-01-06 NOTE — Assessment & Plan Note (Signed)
Reports burning sensation in feet, especially at night. Currently on Gabapentin 100mg , which seems to help. Reports 200mg  causes dizziness. -Chronic, stable on gabapentin  -Continue Gabapentin 100mg  for neuropathy.

## 2023-01-06 NOTE — Assessment & Plan Note (Signed)
Chronic  Stable  Does well on 1/4 pill of 5mg  Ambien at bedtime  Refill sent to pharmacy

## 2023-01-06 NOTE — Assessment & Plan Note (Signed)
Chronic conditions are stable  Patient was counseled on benefits of regular physical activity with goal of 150 minutes of moderate to vigurous intensity 4 days per week  Patient was counseled to consume well balanced diet of fruits, vegetables, limited saturated fats and limited sugary foods and beverages with emphasis on consuming 6-8 glasses of water daily  Screening recommended today: lipids,Hep C Vaccines recommended today: COVID,Shingrix

## 2023-01-07 LAB — LIPID PANEL
Chol/HDL Ratio: 3.9 ratio (ref 0.0–4.4)
Cholesterol, Total: 168 mg/dL (ref 100–199)
HDL: 43 mg/dL (ref >39)
LDL Chol Calc (NIH): 85 mg/dL (ref 0–99)
Triglycerides: 242 mg/dL — ABNORMAL HIGH (ref 0–149)
VLDL Cholesterol Cal: 40 mg/dL (ref 5–40)

## 2023-01-07 LAB — CBC
Hematocrit: 38.6 % (ref 34.0–46.6)
Hemoglobin: 12.8 g/dL (ref 11.1–15.9)
MCH: 29.9 pg (ref 26.6–33.0)
MCHC: 33.2 g/dL (ref 31.5–35.7)
MCV: 90 fL (ref 79–97)
Platelets: 223 10*3/uL (ref 150–450)
RBC: 4.28 x10E6/uL (ref 3.77–5.28)
RDW: 12.8 % (ref 11.7–15.4)
WBC: 6.2 10*3/uL (ref 3.4–10.8)

## 2023-01-07 LAB — CMP14+EGFR
ALT: 20 IU/L (ref 0–32)
AST: 23 IU/L (ref 0–40)
Albumin: 4.4 g/dL (ref 3.8–4.8)
Alkaline Phosphatase: 98 IU/L (ref 44–121)
BUN/Creatinine Ratio: 22 (ref 12–28)
BUN: 24 mg/dL (ref 8–27)
Bilirubin Total: 0.5 mg/dL (ref 0.0–1.2)
CO2: 24 mmol/L (ref 20–29)
Calcium: 10.3 mg/dL (ref 8.7–10.3)
Chloride: 103 mmol/L (ref 96–106)
Creatinine, Ser: 1.08 mg/dL — ABNORMAL HIGH (ref 0.57–1.00)
Globulin, Total: 2.1 g/dL (ref 1.5–4.5)
Glucose: 87 mg/dL (ref 70–99)
Potassium: 3.4 mmol/L — ABNORMAL LOW (ref 3.5–5.2)
Sodium: 144 mmol/L (ref 134–144)
Total Protein: 6.5 g/dL (ref 6.0–8.5)
eGFR: 55 mL/min/{1.73_m2} — ABNORMAL LOW (ref >59)

## 2023-01-07 LAB — HEPATITIS C ANTIBODY: Hep C Virus Ab: NONREACTIVE

## 2023-01-07 LAB — URIC ACID: Uric Acid: 4.8 mg/dL (ref 3.1–7.9)

## 2023-01-07 LAB — TSH+T4F+T3FREE
Free T4: 1.52 ng/dL (ref 0.82–1.77)
T3, Free: 2.6 pg/mL (ref 2.0–4.4)
TSH: 0.984 u[IU]/mL (ref 0.450–4.500)

## 2023-01-08 ENCOUNTER — Other Ambulatory Visit: Payer: Self-pay | Admitting: Family Medicine

## 2023-01-08 MED ORDER — POTASSIUM CHLORIDE CRYS ER 10 MEQ PO TBCR: 10.0000 meq | EXTENDED_RELEASE_TABLET | Freq: Every day | ORAL | 0 refills | Status: DC

## 2023-01-09 ENCOUNTER — Other Ambulatory Visit: Payer: Self-pay | Admitting: Family Medicine

## 2023-01-10 ENCOUNTER — Telehealth: Payer: Self-pay | Admitting: Family Medicine

## 2023-01-10 NOTE — Telephone Encounter (Signed)
[facility] pharmacy is requesting prescription refill gabapentin (NEURONTIN) 100 MG capsule  Please advise

## 2023-01-11 ENCOUNTER — Encounter: Payer: Self-pay | Admitting: Physician Assistant

## 2023-01-11 NOTE — Telephone Encounter (Signed)
Requested medications are due for refill today.  no  Requested medications are on the active medications list.  yes  Last refill. 01/08/2023 #30   Future visit scheduled.   no  Notes to clinic.  Abnormal labs. Pt is requesting a 90 day supply.    Requested Prescriptions  Pending Prescriptions Disp Refills   potassium chloride (KLOR-CON M) 10 MEQ tablet [Pharmacy Med Name: POTASSIUM CL MICRO ER TABS] 90 tablet     Sig: TAKE 1 TABLET(10 MEQ) BY MOUTH DAILY     Endocrinology:  Minerals - Potassium Supplementation Failed - 01/09/2023  9:54 AM      Failed - K in normal range and within 360 days    Potassium  Date Value Ref Range Status  01/06/2023 3.4 (L) 3.5 - 5.2 mmol/L Final         Failed - Cr in normal range and within 360 days    Creatinine, Ser  Date Value Ref Range Status  01/06/2023 1.08 (H) 0.57 - 1.00 mg/dL Final         Passed - Valid encounter within last 12 months    Recent Outpatient Visits           5 days ago Annual physical exam   [facility] [facility] Simmons-Robinson, [place], [place]   3 months ago Overweight with body mass index (BMI) of 28 to 28.9 in adult   [facility] Simmons-Robinson, [place], MD   4 months ago Hypothyroidism, unspecified type   [facility] [place], [place], PA-C   8 months ago Upper respiratory symptom   [place] [facility] [place], [place], [place]   11 months ago Hypertension, unspecified type   [facility] Bosie Clos, MD       Future Appointments             In 5 months Willeen Niece, MD [facility]

## 2023-01-12 ENCOUNTER — Other Ambulatory Visit: Payer: Self-pay

## 2023-01-12 DIAGNOSIS — G629 Polyneuropathy, unspecified: Secondary | ICD-10-CM

## 2023-01-12 NOTE — Telephone Encounter (Signed)
LOV 01/06/23 NOV none LRF 12/19/21 90 x 1

## 2023-01-13 MED ORDER — GABAPENTIN 100 MG PO CAPS
100.0000 mg | ORAL_CAPSULE | Freq: Every day | ORAL | 0 refills | Status: AC
Start: 2023-01-13 — End: ?

## 2023-02-01 DIAGNOSIS — H43813 Vitreous degeneration, bilateral: Secondary | ICD-10-CM | POA: Diagnosis not present

## 2023-02-01 DIAGNOSIS — H0015 Chalazion left lower eyelid: Secondary | ICD-10-CM | POA: Diagnosis not present

## 2023-02-01 DIAGNOSIS — D3131 Benign neoplasm of right choroid: Secondary | ICD-10-CM | POA: Diagnosis not present

## 2023-02-01 DIAGNOSIS — Z961 Presence of intraocular lens: Secondary | ICD-10-CM | POA: Diagnosis not present

## 2023-02-15 ENCOUNTER — Ambulatory Visit
Admission: RE | Admit: 2023-02-15 | Discharge: 2023-02-15 | Disposition: A | Discharge status: Home / Self Care | Payer: Medicare (Managed Care) | Source: Ambulatory Visit | Attending: Family Medicine | Admitting: Family Medicine

## 2023-02-15 DIAGNOSIS — M8589 Other specified disorders of bone density and structure, multiple sites: Secondary | ICD-10-CM | POA: Diagnosis not present

## 2023-02-15 DIAGNOSIS — Z13820 Encounter for screening for osteoporosis: Secondary | ICD-10-CM | POA: Insufficient documentation

## 2023-02-15 DIAGNOSIS — Z780 Asymptomatic menopausal state: Secondary | ICD-10-CM | POA: Diagnosis not present

## 2023-02-16 ENCOUNTER — Other Ambulatory Visit: Payer: Self-pay | Admitting: Family Medicine

## 2023-02-16 ENCOUNTER — Other Ambulatory Visit: Payer: Self-pay | Admitting: Physician Assistant

## 2023-02-16 DIAGNOSIS — E039 Hypothyroidism, unspecified: Secondary | ICD-10-CM

## 2023-02-23 ENCOUNTER — Ambulatory Visit: Payer: Self-pay

## 2023-02-23 NOTE — Telephone Encounter (Signed)
    Chief Complaint: Had COVID vaccine yesterday. Today has back pain, headache."I feel bad." Asking for VV, appointment made. Reviewed home care Symptoms: Above Frequency: Today Pertinent Negatives: Patient denies fever Disposition: [] ED /[] Urgent Care (no appt availability in office) / [x] Appointment(In office/virtual)/ []  [place] [facility]/ [] Home Care/ [] Refused Recommended Disposition /[] [facility]/ []  Follow-up with PCP Additional Notes: Go to UC for worsening of symptoms.  Reason for Disposition  COVID-19 vaccine, systemic reactions (e.g., fatigue, fever, muscle aches), questions about  Answer Assessment - Initial Assessment Questions 1. MAIN CONCERN OR SYMPTOM:  "What is your main concern right now?" "What question do you have?" "What's the main symptom you're worried about?" (e.g., fever, pain, redness, swelling)   Left  Back pain, headache 2. VACCINE: "What vaccination did you receive?" (e.g., none; AstraZeneca, J&J, Moderna, Pfizer, other)      Unsure 3. SYMPTOM ONSET: "When did the  begin?" (e.g., not relevant; hours, days)      Today 4. SYMPTOM SEVERITY: "How bad is it?"      Severe 5. FEVER: "Is there a fever?" If Yes, ask: "What is it, how was it measured, and when did it start?"      No 6. PAST REACTIONS: "Have you reacted to immunizations before?" If Yes, ask: "What happened?"     No 7. OTHER SYMPTOMS: "Do you have any other symptoms?" (e.g., fatigue, headache, joint or muscle pain)     Above 8. PREGNANCY: "Is there any chance you are pregnant?" "When was your last menstrual period?"     No  Protocols used: Coronavirus (COVID-19) Vaccine Questions and Reactions-A-AH

## 2023-02-24 ENCOUNTER — Telehealth (INDEPENDENT_AMBULATORY_CARE_PROVIDER_SITE_OTHER): Payer: Medicare (Managed Care) | Admitting: Family Medicine

## 2023-02-24 DIAGNOSIS — M546 Pain in thoracic spine: Secondary | ICD-10-CM | POA: Diagnosis not present

## 2023-02-24 DIAGNOSIS — T50905A Adverse effect of unspecified drugs, medicaments and biological substances, initial encounter: Secondary | ICD-10-CM | POA: Insufficient documentation

## 2023-02-24 DIAGNOSIS — Z23 Encounter for immunization: Secondary | ICD-10-CM

## 2023-02-24 NOTE — Patient Instructions (Addendum)
Temporary VAERS E-Report Number:    [id number]  Take 500 mg tylenol every 4-6 hours for the next 1-3 days (the goal is to address the inflammation but you can stop if your pain resolves completely before the end of three days)

## 2023-02-24 NOTE — Assessment & Plan Note (Signed)
As noted above, discussed with patient that I do not believe her pain is a reaction to the COVID-vaccine.  However, given that it is impossible to say this with 100% certainty, I went ahead and submitted an adverse reaction report to the Vaccine Adverse Event Reporting System (VAERS).  - Temporary VAERS E-Report Number:   [id number]

## 2023-02-24 NOTE — Progress Notes (Signed)
MyChart Video Visit    Virtual Visit via Video Note   This format is felt to be most appropriate for this patient at this time. Physical exam was limited by quality of the video and audio technology used for the visit.   - Patient's video was limited by poor connection.  However, she was able to see me and where I was pointing on my body in order to communicate the location of her pain.  Patient location: Home Provider location: [facility]  I discussed the limitations of evaluation and management by telemedicine and the availability of in person appointments. The patient expressed understanding and agreed to proceed.  Patient: [name]   DOB: Jul 11, 1950   72 y.o. Female  MRN: [id number] Visit Date: 02/24/2023  Today's healthcare provider: Sherlyn Hay, DO    Subjective    HPI HPI   Patient reports she got a COVID vaccine Proofreader) on 02/22/23 at [facility]. She reports that yesterday she used a Ambulance person on her couch. And soon after felt like she could not take a deep breath. She reports pain in her left upper lung for a few hours. She did take 1000mg  of Tylenol and that did help with the pain. She is not having any problems with breathing today.  Last edited by Myles Lipps, CMA on 02/24/2023  9:17 AM.       Woke up about ten minutes ago Has some soreness in her back, which feels like in the lung area  So much worse yesterday she could hardly take a deep breath.  Vaccine received 02/22/2023 at [facility] and it was a [employer] shot (5th shot total)  - got it because she's getting on a plane on Tuesday and wanted a little protection Arm is still very sore Had been cleaning her couch yesterday as well   - took two 500 mg tylenol which helped  - still having some   - pain in upper middle back (medial to the scapula)  - right handed   Medications: Outpatient Medications Prior to Visit  Medication Sig   allopurinol (ZYLOPRIM) 300 MG  tablet TAKE 1 TABLET(300 MG) BY MOUTH DAILYTAKE 1 TABLET(300 MG) BY MOUTH DAILY   Ascorbic Acid (VITAMIN C) 500 MG CHEW Chew 2 tablets by mouth daily.   celecoxib (CELEBREX) 200 MG capsule Take 200 mg by mouth daily.   Cholecalciferol (VITAMIN D-1000 MAX ST) 25 MCG (1000 UT) tablet Take 1 tablet by mouth daily.   gabapentin (NEURONTIN) 100 MG capsule Take 1 capsule (100 mg total) by mouth at bedtime.   hydroquinone 4 % cream Apply to dark spots twice a day until improved.   levothyroxine (SYNTHROID) 75 MCG tablet TAKE 1 TABLET BY MOUTH DAILY   Multiple Vitamins-Minerals (ZINC PO) Take by mouth.   Naproxen Sodium 220 MG CAPS Take 1 tablet by mouth at bedtime as needed.   omeprazole (PRILOSEC) 20 MG capsule Take 1 capsule (20 mg total) by mouth daily.   potassium chloride (KLOR-CON M) 10 MEQ tablet TAKE 1 TABLET(10 MEQ) BY MOUTH DAILY   simvastatin (ZOCOR) 40 MG tablet TAKE 1 TABLET(40 MG) BY MOUTH DAILY   tacrolimus (PROTOPIC) 0.1 % ointment Apply topically 2 (two) times daily. Bid to scar on left calf and rash around left eye until clear, then prn flares   triamterene-hydrochlorothiazide (DYAZIDE) 37.5-25 MG capsule TAKE 1 CAPSULE BY MOUTH EVERY DAY   zolpidem (AMBIEN) 5 MG tablet Take 1 tablet (5 mg total)  by mouth at bedtime as needed for sleep.   [DISCONTINUED] tirzepatide (ZEPBOUND) 2.5 MG/0.5ML Pen Inject 2.5 mg into the skin once a week.   No facility-administered medications prior to visit.    Review of Systems  Constitutional:  Negative for appetite change, chills, fatigue and fever.  Respiratory:  Negative for cough, chest tightness and shortness of breath.        +difficulty taking a deep breath  Cardiovascular:  Negative for chest pain and palpitations.  Gastrointestinal:  Negative for abdominal pain, nausea and vomiting.  Musculoskeletal:  Positive for back pain (left, medial to mid-scapula).  Neurological:  Negative for dizziness and weakness.        Objective    LMP   (LMP Unknown) Comment: 1980s      Physical Exam  Unable to perform physical exam due to it being a virtual visit with connection issues. Patient was able to speak in complete sentences without needing to pause for breath. Patient was able to see me to indicate the location of her back pain relative to where I was pointing on my back.    Assessment & Plan    Acute left-sided thoracic back pain Assessment & Plan: Patient's pain started sometime after cleaning her couch yesterday, as well as in relation to receiving the COVID booster vaccination the day prior to that (02/22/2023).  Based on the relief she received from taking 1000 mg p.o. Tylenol, I suspect her pain is likely secondary to straining a muscle while cleaning her couch rather than a reaction to the COVID vaccine she received. - Advised her to take 500 mg p.o. Tylenol every 4-6 hours on a scheduled basis for the next 1 to 3 days, advising that, if her pain resolves completely on this time, she can stop prior to completing 3 days worth.    Reaction to shot, initial encounter Assessment & Plan: As noted above, discussed with patient that I do not believe her pain is a reaction to the COVID-vaccine.  However, given that it is impossible to say this with 100% certainty, I went ahead and submitted an adverse reaction report to the Vaccine Adverse Event Reporting System (VAERS).  - Temporary VAERS E-Report Number:   [id number]   Immunization due  Shingles - got original shingles vaccine in 2016 (got shingles in 2015); recommended she also get the Shingrix vaccine. Discussed prevnar-20 (last shot in 2019) Recommended flu vaccine   Return if symptoms worsen or fail to improve.     I discussed the assessment and treatment plan with the patient. The patient was provided an opportunity to ask questions and all were answered. The patient agreed with the plan and demonstrated an understanding of the instructions.   The patient was  advised to call back or seek an in-person evaluation if the symptoms worsen or if the condition fails to improve as anticipated.  I provided 34 minutes of non-face-to-face time during this encounter.   Sherlyn Hay, DO [facility] [phone] (phone) [phone] (fax)  [facility]

## 2023-02-24 NOTE — Assessment & Plan Note (Signed)
Patient's pain started sometime after cleaning her couch yesterday, as well as in relation to receiving the COVID booster vaccination the day prior to that (02/22/2023).  Based on the relief she received from taking 1000 mg p.o. Tylenol, I suspect her pain is likely secondary to straining a muscle while cleaning her couch rather than a reaction to the COVID vaccine she received. - Advised her to take 500 mg p.o. Tylenol every 4-6 hours on a scheduled basis for the next 1 to 3 days, advising that, if her pain resolves completely on this time, she can stop prior to completing 3 days worth.

## 2023-03-06 ENCOUNTER — Encounter: Payer: Self-pay | Admitting: Family Medicine

## 2023-03-18 ENCOUNTER — Other Ambulatory Visit: Payer: Self-pay | Admitting: Physician Assistant

## 2023-03-18 DIAGNOSIS — I10 Essential (primary) hypertension: Secondary | ICD-10-CM

## 2023-03-18 NOTE — Telephone Encounter (Signed)
Requested Prescriptions  Pending Prescriptions Disp Refills   triamterene-hydrochlorothiazide (DYAZIDE) 37.5-25 MG capsule [Pharmacy Med Name: TRIAMTERENE 37.5MG / HCTZ 25MG  CAPS] 90 capsule 2    Sig: TAKE 1 CAPSULE BY MOUTH EVERY DAY     Cardiovascular: Diuretic Combos Failed - 03/18/2023 10:23 AM      Failed - K in normal range and within 180 days    Potassium  Date Value Ref Range Status  01/06/2023 3.4 (L) 3.5 - 5.2 mmol/L Final         Failed - Cr in normal range and within 180 days    Creatinine, Ser  Date Value Ref Range Status  01/06/2023 1.08 (H) 0.57 - 1.00 mg/dL Final         Passed - Na in normal range and within 180 days    Sodium  Date Value Ref Range Status  01/06/2023 144 134 - 144 mmol/L Final         Passed - Last BP in normal range    BP Readings from Last 1 Encounters:  01/06/23 104/70         Passed - Valid encounter within last 6 months    Recent Outpatient Visits           3 weeks ago Acute left-sided thoracic back pain   [facility] [place], Monico Blitz, DO   2 months ago Annual physical exam   [facility] Simmons-Robinson, [place], MD   5 months ago Overweight with body mass index (BMI) of 28 to 28.9 in adult   [facility] Simmons-Robinson, [place], MD   7 months ago Hypothyroidism, unspecified type   [facility] [place], [place], PA-C   10 months ago Upper respiratory symptom   [place] [facility] [place], [place], PA-C       Future Appointments             In 3 months Willeen Niece, MD [facility]

## 2023-03-19 ENCOUNTER — Other Ambulatory Visit: Payer: Self-pay | Admitting: Family Medicine

## 2023-03-19 DIAGNOSIS — F5101 Primary insomnia: Secondary | ICD-10-CM

## 2023-03-21 NOTE — Telephone Encounter (Signed)
Requested medication (s) are due for refill today - yes  Requested medication (s) are on the active medication list -yes  Future visit scheduled -no  Last refill: 01/06/23 #21  Notes to clinic: non delegated Rx  Requested Prescriptions  Pending Prescriptions Disp Refills   zolpidem (AMBIEN) 5 MG tablet [Pharmacy Med Name: ZOLPIDEM 5MG  TABLETS] 21 tablet     Sig: TAKE 1 TABLET(5 MG) BY MOUTH AT BEDTIME AS NEEDED FOR SLEEP     Not Delegated - Psychiatry:  Anxiolytics/Hypnotics Failed - 03/19/2023 10:23 AM      Failed - This refill cannot be delegated      Failed - Urine Drug Screen completed in last 360 days      Passed - Valid encounter within last 6 months    Recent Outpatient Visits           3 weeks ago Acute left-sided thoracic back pain   [facility] [place], Monico Blitz, DO   2 months ago Annual physical exam   [facility] Simmons-Robinson, [place], MD   5 months ago Overweight with body mass index (BMI) of 28 to 28.9 in adult   [facility] Simmons-Robinson, [place], MD   7 months ago Hypothyroidism, unspecified type   [facility] [place], [place], PA-C   10 months ago Upper respiratory symptom   [facility] [place], [place], PA-C       Future Appointments             In 3 months Willeen Niece, MD [facility]               Requested Prescriptions  Pending Prescriptions Disp Refills   zolpidem (AMBIEN) 5 MG tablet [Pharmacy Med Name: ZOLPIDEM 5MG  TABLETS] 21 tablet     Sig: TAKE 1 TABLET(5 MG) BY MOUTH AT BEDTIME AS NEEDED FOR SLEEP     Not Delegated - Psychiatry:  Anxiolytics/Hypnotics Failed - 03/19/2023 10:23 AM      Failed - This refill cannot be delegated      Failed - Urine Drug Screen completed in last 360 days      Passed - Valid encounter within last 6 months    Recent Outpatient Visits            3 weeks ago Acute left-sided thoracic back pain   [facility] [place], Monico Blitz, DO   2 months ago Annual physical exam   [place] [facility] Simmons-Robinson, [place], MD   5 months ago Overweight with body mass index (BMI) of 28 to 28.9 in adult   [facility] Simmons-Robinson, [place], MD   7 months ago Hypothyroidism, unspecified type   [facility] [place], [place], PA-C   10 months ago Upper respiratory symptom   [place] [facility] [place], Jocelin [place], PA-C       Future Appointments             In 3 months Willeen Niece, MD [facility] [place] [facility]

## 2023-03-23 ENCOUNTER — Other Ambulatory Visit: Payer: Self-pay | Admitting: Family Medicine

## 2023-03-23 DIAGNOSIS — G629 Polyneuropathy, unspecified: Secondary | ICD-10-CM

## 2023-04-11 DIAGNOSIS — M7062 Trochanteric bursitis, left hip: Secondary | ICD-10-CM | POA: Diagnosis not present

## 2023-04-17 ENCOUNTER — Encounter: Payer: Self-pay | Admitting: Family Medicine

## 2023-04-19 ENCOUNTER — Encounter: Payer: Self-pay | Admitting: Physician Assistant

## 2023-04-19 MED ORDER — ALLOPURINOL 300 MG PO TABS: ORAL_TABLET | ORAL | 1 refills | Status: DC

## 2023-04-20 ENCOUNTER — Encounter: Payer: Self-pay | Admitting: Family Medicine

## 2023-04-21 ENCOUNTER — Ambulatory Visit (INDEPENDENT_AMBULATORY_CARE_PROVIDER_SITE_OTHER): Payer: Medicare (Managed Care) | Admitting: Family Medicine

## 2023-04-21 ENCOUNTER — Encounter: Payer: Self-pay | Admitting: Family Medicine

## 2023-04-21 ENCOUNTER — Ambulatory Visit: Payer: Self-pay

## 2023-04-21 VITALS — BP 135/81 | HR 69 | Temp 98.6°F | Ht 64.0 in | Wt 158.0 lb

## 2023-04-21 DIAGNOSIS — R2241 Localized swelling, mass and lump, right lower limb: Secondary | ICD-10-CM

## 2023-04-21 NOTE — Progress Notes (Signed)
Established patient visit   Patient: [name]   DOB: Apr 13, 1951   72 y.o. Female  MRN: [id number] Visit Date: 04/21/2023  Today's healthcare provider: Ronnald Ramp, MD   Chief Complaint  Patient presents with   Ankle Pain    Patient has right ankle swelling and pain.  No known trauma.  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 Patient also relates that she has recently had left hip bursitis and had it injected last week.  Patient states that this did not help her hip pain.  She called that provider yesterday to report the pain and was prescribed Tramadol.  She said she doesn't know if this will help her foot and hip pain.   Patient staes   Subjective     HPI     Ankle Pain    Additional comments: Patient has right ankle swelling and pain.  No known trauma.  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 Patient also relates that she has recently had left hip bursitis and had it injected last week.  Patient states that this did not help her hip pain.  She called that provider yesterday to report the pain and was prescribed Tramadol.  She said she doesn't know if this will help her foot and hip pain.   Patient staes      Last edited by Adline Peals, CMA on 04/21/2023  4:04 PM.       Discussed the use of AI scribe software for clinical note transcription with the patient, who gave verbal consent to proceed.  History of Present Illness   The patient presents with a chief complaint of right foot and ankle swelling, which has been progressively worsening since a trip to [place] in September. The patient reports no history of trauma or injury to the foot, and denies any pain in the calf. The swelling is localized to the right foot, with no similar symptoms in the left foot. The patient also notes a change in the arch of the right foot, which has seemingly "dropped" and is causing discomfort.  In addition to the  foot swelling, the patient has been experiencing pain in the left hip, diagnosed as trochanteric bursitis, for which she received a cortisone shot a week prior. The hip pain has been persistent and bothersome, contributing to the patient's overall discomfort.  The patient also reports a history of gout, managed with allopurinol, and notes occasional sharp pain in the foot and toe. She has a history of a broken toe, which was initially misdiagnosed as a bunion and surgically treated in the 1980s. The patient is unsure if there is a connection between the current foot swelling and the past surgical intervention.  The patient has been experiencing increased irritability and expresses concern about her overall health, fearing that something else might be wrong. She reports no changes in breathing or shortness of breath. The patient's blood pressure was noted to be higher than usual at 143/85, which she attributes to the pain from the foot and hip.  The patient is currently on a regimen of allopurinol and a potassium supplement, and has recently been prescribed tramadol for pain management. She expresses uncertainty about the new medication and its potential side effects. The patient is also on hydrochlorothiazide, which she suspects might be contributing to the foot swelling.  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 Excised 05/27/2020, margins free.    Medications: Outpatient Medications Prior to Visit  Medication Sig   allopurinol (ZYLOPRIM) 300 MG tablet TAKE 1 TABLET(300 MG) BY MOUTH DAILYTAKE 1 TABLET(300 MG) BY MOUTH DAILY   Ascorbic Acid (VITAMIN C) 500 MG CHEW Chew 2 tablets by mouth daily.   celecoxib (CELEBREX) 200 MG capsule Take 200 mg by mouth daily.   Cholecalciferol (VITAMIN D-1000 MAX ST) 25 MCG (1000 UT) tablet Take 1 tablet by mouth daily.   gabapentin (NEURONTIN) 100 MG capsule TAKE 1 CAPSULE(100 MG) BY MOUTH AT BEDTIME   hydroquinone 4 % cream Apply to dark spots twice a day until improved.   levothyroxine (SYNTHROID) 75 MCG tablet TAKE 1 TABLET BY MOUTH DAILY   Multiple Vitamins-Minerals (ZINC PO) Take by mouth.   Naproxen Sodium 220 MG CAPS Take 1 tablet by mouth at bedtime as needed.   omeprazole (PRILOSEC) 20 MG capsule Take 1 capsule (20 mg total) by mouth daily.   potassium chloride (KLOR-CON M) 10 MEQ tablet TAKE 1 TABLET(10 MEQ) BY MOUTH DAILY   simvastatin (ZOCOR) 40 MG tablet TAKE 1 TABLET(40 MG) BY MOUTH DAILY   tacrolimus (PROTOPIC) 0.1 % ointment Apply topically 2 (two) times daily. Bid to scar on left calf and rash around left eye until clear, then prn flares   triamterene-hydrochlorothiazide (DYAZIDE) 37.5-25 MG capsule TAKE 1 CAPSULE BY MOUTH EVERY DAY   zolpidem (AMBIEN) 5 MG tablet TAKE 1 TABLET(5 MG) BY MOUTH AT BEDTIME AS NEEDED FOR SLEEP   No facility-administered medications prior to visit.    Review of Systems  Last metabolic panel Lab Results  Component Value Date   GLUCOSE 87 01/06/2023   NA 144 01/06/2023   K 3.4 (L) 01/06/2023   CL 103 01/06/2023   CO2 24 01/06/2023   BUN 24 01/06/2023   CREATININE 1.08 (H) 01/06/2023   EGFR 55 (L) 01/06/2023   CALCIUM 10.3 01/06/2023   PROT 6.5 01/06/2023   ALBUMIN 4.4 01/06/2023   LABGLOB 2.1 01/06/2023   AGRATIO 1.8 10/05/2022   BILITOT 0.5 01/06/2023   ALKPHOS  98 01/06/2023   AST 23 01/06/2023   ALT 20 01/06/2023   Last lipids Lab Results  Component Value Date   CHOL 168 01/06/2023   HDL 43 01/06/2023   LDLCALC 85 01/06/2023   TRIG 242 (H) 01/06/2023   CHOLHDL 3.9 01/06/2023   Last hemoglobin A1c Lab Results  Component Value Date   HGBA1C 5.1 10/05/2022   Last thyroid functions Lab Results  Component Value Date   TSH 0.984 01/06/2023        Objective    BP 135/81 (BP Location: Right Arm, Patient Position: Sitting, Cuff Size: Normal)   Pulse 69   Temp 98.6 F (37 C) (Oral)  Ht 5\' 4"  (1.626 m)   Wt 158 lb (71.7 kg)   LMP  (LMP Unknown) Comment: 1980s  SpO2 99%   BMI 27.12 kg/m  BP Readings from Last 3 Encounters:  04/21/23 135/81  01/06/23 104/70  01/05/23 103/67   Wt Readings from Last 3 Encounters:  04/21/23 158 lb (71.7 kg)  01/06/23 159 lb (72.1 kg)  12/08/22 164 lb 11.2 oz (74.7 kg)       Physical Exam Musculoskeletal:     Right foot: Normal range of motion.  Feet:     Right foot:     Skin integrity: Skin integrity normal. No erythema or warmth.     Comments: Right pedal edema, right ankle edema       No results found for any visits on 04/21/23.  Assessment & Plan     Problem List Items Addressed This Visit   None Visit Diagnoses     Localized swelling of right foot    -  Primary   Relevant Orders   DG Foot Complete Right   Ambulatory referral to Podiatry          Right Foot Swelling New onset, unilateral swelling of the right foot with pain and loss of arch. No history of trauma or prolonged immobilization. No signs of systemic involvement such as shortness of breath or bilateral swelling. -Order X-ray of the right foot. -Refer to podiatry for further evaluation and management.  Left Hip Pain History of trochanteric bursitis with recent cortisone injection. Persistent pain since the injection. -Continue current pain management plan with Tramadol 50mg  as needed. - follow-up with Dr.  Odis Luster if pain persists or worsens.  General Health Maintenance -Continue Allopurinol for gout prevention. -Continue Hydrochlorothiazide and monitor blood pressure and triglyceride levels. -Continue prescribed Potassium supplement.  Follow-up -Return to clinic if new symptoms develop such as shortness of breath or if current symptoms worsen.         No follow-ups on file.         Ronnald Ramp, MD  [facility] [phone] (phone) [phone] (fax)  [facility]

## 2023-04-21 NOTE — Telephone Encounter (Signed)
     Chief Complaint: Rifht foot and ankle pain, swelling. Symptoms: Above Frequency: "It's been going on for awhile, but getting worse. It's getting harder to walk." Pertinent Negatives: Patient denies fever Disposition: [] ED /[] Urgent Care (no appt availability in office) / [x] Appointment(In office/virtual)/ []  [place] [facility]/ [] Home Care/ [] Refused Recommended Disposition /[]  Mobile Bus/ []  Follow-up with PCP Additional Notes: Pt. Agrees with appointment.  Reason for Disposition  [1] Swollen foot AND [2] no fever  (Exceptions: localized bump from bunions, calluses, insect bite, sting)  Answer Assessment - Initial Assessment Questions 1. ONSET: "When did the pain start?"      For awhile 2. LOCATION: "Where is the pain located?"      Right 3. PAIN: "How bad is the pain?"    (Scale 1-10; or mild, moderate, severe)  - MILD (1-3): doesn't interfere with normal activities.   - MODERATE (4-7): interferes with normal activities (e.g., work or school) or awakens from sleep, limping.   - SEVERE (8-10): excruciating pain, unable to do any normal activities, unable to walk.      8 4. WORK OR EXERCISE: "Has there been any recent work or exercise that involved this part of the body?"      No 5. CAUSE: "What do you think is causing the foot pain?"     Unsure 6. OTHER SYMPTOMS: "Do you have any other symptoms?" (e.g., leg pain, rash, fever, numbness)     Swelling 7. PREGNANCY: "Is there any chance you are pregnant?" "When was your last menstrual period?"     No  Protocols used: Foot Pain-A-AH

## 2023-04-22 ENCOUNTER — Ambulatory Visit
Admission: RE | Admit: 2023-04-22 | Discharge: 2023-04-22 | Disposition: A | Discharge status: Home / Self Care | Payer: Medicare (Managed Care) | Source: Ambulatory Visit | Attending: Family Medicine | Admitting: Family Medicine

## 2023-04-22 ENCOUNTER — Ambulatory Visit
Admission: RE | Admit: 2023-04-22 | Discharge: 2023-04-22 | Disposition: A | Discharge status: Home / Self Care | Payer: Medicare (Managed Care) | Attending: Family Medicine | Admitting: Family Medicine

## 2023-04-22 DIAGNOSIS — S93124A Dislocation of metatarsophalangeal joint of right lesser toe(s), initial encounter: Secondary | ICD-10-CM | POA: Diagnosis not present

## 2023-04-22 DIAGNOSIS — M7989 Other specified soft tissue disorders: Secondary | ICD-10-CM | POA: Diagnosis not present

## 2023-04-22 DIAGNOSIS — R2241 Localized swelling, mass and lump, right lower limb: Secondary | ICD-10-CM | POA: Diagnosis not present

## 2023-04-22 DIAGNOSIS — M129 Arthropathy, unspecified: Secondary | ICD-10-CM | POA: Diagnosis not present

## 2023-04-26 ENCOUNTER — Other Ambulatory Visit: Payer: Self-pay | Admitting: Family Medicine

## 2023-04-26 DIAGNOSIS — M1A0710 Idiopathic chronic gout, right ankle and foot, without tophus (tophi): Secondary | ICD-10-CM

## 2023-04-26 MED ORDER — COLCHICINE 0.6 MG PO TABS: ORAL_TABLET | ORAL | 1 refills | Status: DC

## 2023-05-02 ENCOUNTER — Other Ambulatory Visit: Payer: Self-pay | Admitting: Family Medicine

## 2023-05-02 MED ORDER — OMEPRAZOLE 20 MG PO CPDR: 20.0000 mg | DELAYED_RELEASE_CAPSULE | Freq: Every day | ORAL | 3 refills | Status: DC

## 2023-05-06 ENCOUNTER — Encounter: Payer: Self-pay | Admitting: Family Medicine

## 2023-05-10 ENCOUNTER — Encounter: Payer: Self-pay | Admitting: Podiatry

## 2023-05-10 ENCOUNTER — Ambulatory Visit: Payer: Medicare (Managed Care) | Admitting: Podiatry

## 2023-05-10 VITALS — Ht 64.0 in | Wt 158.0 lb

## 2023-05-10 DIAGNOSIS — G629 Polyneuropathy, unspecified: Secondary | ICD-10-CM

## 2023-05-10 DIAGNOSIS — M14671 Charcot's joint, right ankle and foot: Secondary | ICD-10-CM

## 2023-05-10 NOTE — Progress Notes (Signed)
Subjective:  Patient ID: [name], female    DOB: May 12, 1951,  MRN: [id number]  Chief Complaint  Patient presents with   Foot Pain    Pt is here due to right foot pain.    72 y.o. female presents with the above complaint.  Patient presents with complaint of right foot pain.  She states it just came out of nowhere.  She has had neuropathy for quite some time.  She states is getting red sometimes swollen.  She states it does not feel right she wanted to get it evaluated some pain noted.  Overall no wounds or open lesion noted.  She is not a diabetic.  Unsure where her neuropathy comes from.  Denies any other acute issues   Review of Systems: Negative except as noted in the HPI. Denies N/V/F/Ch.  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 Excised 05/27/2020, margins free.    Current Outpatient Medications:    allopurinol (ZYLOPRIM) 300 MG tablet, TAKE 1 TABLET(300 MG) BY MOUTH DAILYTAKE 1 TABLET(300 MG) BY MOUTH DAILY, Disp: 90 tablet, Rfl: 1   Ascorbic Acid (VITAMIN C) 500 MG CHEW, Chew 2 tablets by mouth daily., Disp: , Rfl:    celecoxib (CELEBREX) 200 MG capsule, Take 200 mg by mouth daily., Disp: , Rfl:    Cholecalciferol (VITAMIN D-1000 MAX ST) 25 MCG (1000 UT) tablet, Take 1 tablet by mouth daily., Disp: , Rfl:    ciclopirox (PENLAC) 8 % solution, Apply topically at bedtime. Apply over nail and surrounding skin. Apply daily over previous coat. After seven (7) days, may remove with alcohol and continue cycle., Disp: 6.6 mL, Rfl:  0   colchicine 0.6 MG tablet, Take 2 tablets (1.2mg ) by mouth on day one, take an additional 1 tablet one hour after first two tablets.  Take 1 tablet by mouth daily for 6 additional days, Disp: 30 tablet, Rfl: 1   gabapentin (NEURONTIN) 100 MG capsule, TAKE 1 CAPSULE(100 MG) BY MOUTH AT BEDTIME, Disp: 90 capsule, Rfl: 1   hydroquinone 4 % cream, Apply to dark spots twice a day until improved., Disp: 28.35 g, Rfl: 1   levothyroxine (SYNTHROID) 75 MCG tablet, TAKE 1 TABLET BY MOUTH DAILY, Disp: 90 tablet, Rfl: 1   Multiple Vitamins-Minerals (ZINC PO), Take by mouth., Disp: , Rfl:    Naproxen Sodium 220 MG CAPS, Take 1 tablet by mouth at bedtime as needed., Disp: , Rfl:    omeprazole (PRILOSEC) 20 MG capsule, Take 1 capsule (20 mg total) by mouth daily., Disp: 90 capsule, Rfl: 3   potassium chloride (KLOR-CON M) 10 MEQ tablet, TAKE 1 TABLET(10 MEQ) BY MOUTH DAILY, Disp: 90 tablet, Rfl: 0   simvastatin (ZOCOR) 40 MG tablet, TAKE 1 TABLET(40 MG) BY MOUTH DAILY, Disp: 90 tablet, Rfl: 3   tacrolimus (PROTOPIC) 0.1 % ointment, Apply topically 2 (two) times daily. Bid to scar on left calf and rash around left eye until clear, then prn flares, Disp: 60  g, Rfl: 1   triamterene-hydrochlorothiazide (DYAZIDE) 37.5-25 MG capsule, TAKE 1 CAPSULE BY MOUTH EVERY DAY, Disp: 90 capsule, Rfl: 2   zolpidem (AMBIEN) 5 MG tablet, TAKE 1 TABLET(5 MG) BY MOUTH AT BEDTIME AS NEEDED FOR SLEEP, Disp: 21 tablet, Rfl: 0   terbinafine (LAMISIL) 250 MG tablet, Take 1 tablet (250 mg total) by mouth daily., Disp: 90 tablet, Rfl: 0  Social History   Tobacco Use  Smoking Status Never  Smokeless Tobacco Never    No Known Allergies Objective:  There were no vitals filed for this visit. Body mass index is 27.12 kg/m. Constitutional Well developed. Well nourished.  Vascular Dorsalis pedis pulses palpable bilaterally. Posterior tibial pulses palpable bilaterally. Capillary refill normal to all digits.  No cyanosis or  clubbing noted. Pedal hair growth normal.  Neurologic Normal speech. Oriented to person, place, and time. Epicritic sensation to light touch grossly present bilaterally.  Dermatologic Nails well groomed and normal in appearance. No open wounds. No skin lesions.  Orthopedic: Charcot deformity/Charcot foot noted.  No open wounds or lesion noted.  Rocker-bottom deformity noted.  Positive Silfverskiold test with gastrocnemius equinus noted.  Severe flatfoot deformity noted.   Radiographs: 3 views of skeletally mature right foot: Foot Charcot notedHead with multiple bony fragmentation to the Lisfranc joints.  No gross dislocation noted no open wounds or lesion noted no gas infection noted Assessment:   1. Charcot joint of right foot   2. Neuropathy    Plan:  Patient was evaluated and treated and all questions answered.  Right foot Charcot deformity acute flare -All "signs and concerns were discussed with the patient extensive detail at this time I discussed with the patient in extensive detail that she would benefit from being in a cam walker nonweightbearing to allow the Sarahn Charcot process to heal in appropriate manner.  I also discussed with her she will benefit from [facility] walker which was also discussed as well.  Patient will likely need Crow walker to help ambulate especially during her Charcot flareup.  She states understanding. -She will be scheduled to see Trish for [facility] walker  No follow-ups on file.

## 2023-05-11 ENCOUNTER — Encounter: Payer: Self-pay | Admitting: Family Medicine

## 2023-05-11 ENCOUNTER — Telehealth: Payer: Self-pay | Admitting: Podiatry

## 2023-05-11 DIAGNOSIS — M14672 Charcot's joint, left ankle and foot: Secondary | ICD-10-CM

## 2023-05-11 DIAGNOSIS — M14671 Charcot's joint, right ankle and foot: Secondary | ICD-10-CM

## 2023-05-11 MED ORDER — TERBINAFINE HCL 250 MG PO TABS: 250.0000 mg | ORAL_TABLET | Freq: Every day | ORAL | 0 refills | Status: DC

## 2023-05-11 NOTE — Addendum Note (Signed)
Addended by: Nicholes Rough on: 05/11/2023 02:41 PM   Modules accepted: Orders

## 2023-05-11 NOTE — Telephone Encounter (Signed)
Patient called stating she was suppose to get a RX of liquid Lamisil sent over to pharmacy. Pt uses [facility]

## 2023-05-12 ENCOUNTER — Telehealth: Payer: Self-pay | Admitting: Podiatry

## 2023-05-12 NOTE — Telephone Encounter (Signed)
Patient was prescribed pills.  Spoke with you about drops not pills.  Can the drops be sent to her pharmacy?

## 2023-05-17 ENCOUNTER — Other Ambulatory Visit: Payer: Self-pay | Admitting: Podiatry

## 2023-05-17 ENCOUNTER — Telehealth: Payer: Self-pay | Admitting: Family Medicine

## 2023-05-17 ENCOUNTER — Telehealth: Payer: Self-pay | Admitting: Podiatry

## 2023-05-17 ENCOUNTER — Ambulatory Visit: Payer: Medicare (Managed Care) | Admitting: Podiatry

## 2023-05-17 MED ORDER — CICLOPIROX 8 % EX SOLN: Freq: Every day | CUTANEOUS | 0 refills | Status: DC

## 2023-05-17 NOTE — Telephone Encounter (Signed)
Patient called and wanted her x-rays from 11/01 and office visit notes from 11/19.  She is getting a 2nd opinion from a doctor in [place], [place],  [place] we can have them in [place] at the front desk tomorrow morning.  She will come by and pick them up.   >>>>>    both the 11/01 x-rays (6 on a disk) and the notes from 11/19 office visit were put at the front desk this afternoon.    Georgina Snell -- 05/17/2023

## 2023-05-17 NOTE — Telephone Encounter (Signed)
Patient has called about a referral that was sent over to [facility]. Patient states she needs any X-Rays and notes that Dr Roxan Hockey has sent over to [facility], Dr Joylene Igo, at fax # [phone]. Patient states they won't see her until receiving this information. Patient will contact her podiatrist for them to send any information they have as well.  Patients callback #  [phone]

## 2023-05-21 ENCOUNTER — Other Ambulatory Visit: Payer: Self-pay | Admitting: Family Medicine

## 2023-05-21 DIAGNOSIS — F5101 Primary insomnia: Secondary | ICD-10-CM

## 2023-05-24 ENCOUNTER — Encounter: Payer: Self-pay | Admitting: Family Medicine

## 2023-05-24 DIAGNOSIS — F5101 Primary insomnia: Secondary | ICD-10-CM

## 2023-05-24 NOTE — Telephone Encounter (Signed)
Requested medication (s) are due for refill today: yes  Requested medication (s) are on the active medication list: yes  Last refill:  potassium chloride: 02/17/23 #90              Ambien: 03/22/23 #21  Future visit scheduled: no  Notes to clinic:  KCL: lab outside normal range             Ambien: med not delegated to NT to RF   Requested Prescriptions  Pending Prescriptions Disp Refills   potassium chloride (KLOR-CON M) 10 MEQ tablet [Pharmacy Med Name: POTASSIUM CL MICRO ER TABS] 90 tablet 0    Sig: TAKE 1 TABLET(10 MEQ) BY MOUTH DAILY     Endocrinology:  Minerals - Potassium Supplementation Failed - 05/21/2023 12:13 PM      Failed - K in normal range and within 360 days    Potassium  Date Value Ref Range Status  01/06/2023 3.4 (L) 3.5 - 5.2 mmol/L Final         Failed - Cr in normal range and within 360 days    Creatinine, Ser  Date Value Ref Range Status  01/06/2023 1.08 (H) 0.57 - 1.00 mg/dL Final         Passed - Valid encounter within last 12 months    Recent Outpatient Visits           1 month ago Localized swelling of right foot   [facility] Simmons-Robinson, [place], MD   2 months ago Acute left-sided thoracic back pain   [facility] [place], Monico Blitz, DO   4 months ago Annual physical exam   [facility] Simmons-Robinson, [place], MD   7 months ago Overweight with body mass index (BMI) of 28 to 28.9 in adult   [facility] Simmons-Robinson, [place], MD   9 months ago Hypothyroidism, unspecified type   [facility] [place], [place], PA-C       Future Appointments             In 1 month Willeen Niece, MD [facility]             zolpidem (AMBIEN) 5 MG tablet [Pharmacy Med Name: ZOLPIDEM 5MG  TABLETS] 21 tablet     Sig: TAKE 1 TABLET(5 MG) BY MOUTH AT BEDTIME AS NEEDED FOR SLEEP     Not Delegated -  Psychiatry:  Anxiolytics/Hypnotics Failed - 05/21/2023 12:13 PM      Failed - This refill cannot be delegated      Failed - Urine Drug Screen completed in last 360 days      Passed - Valid encounter within last 6 months    Recent Outpatient Visits           1 month ago Localized swelling of right foot   [facility] [place], [place], MD   2 months ago Acute left-sided thoracic back pain   [facility] Pardue, Monico Blitz, DO   4 months ago Annual physical exam   [facility] Simmons-Robinson, [place], MD   7 months ago Overweight with body mass index (BMI) of 28 to 28.9 in adult   [facility] Simmons-Robinson, [place], MD   9 months ago Hypothyroidism, unspecified type   [facility] [place], Edmon Crape, [place]       Future Appointments  In 1 month Willeen Niece, MD [facility]

## 2023-05-25 MED ORDER — POTASSIUM CHLORIDE CRYS ER 10 MEQ PO TBCR: 10.0000 meq | EXTENDED_RELEASE_TABLET | Freq: Every day | ORAL | 0 refills | Status: DC

## 2023-05-25 MED ORDER — ZOLPIDEM TARTRATE 5 MG PO TABS: 5.0000 mg | ORAL_TABLET | Freq: Every evening | ORAL | 0 refills | Status: DC | PRN

## 2023-05-31 ENCOUNTER — Telehealth: Payer: Self-pay | Admitting: Podiatry

## 2023-05-31 NOTE — Telephone Encounter (Signed)
(  See 11/26 notes) ....  Patient called today to say that she has been ill and has not been able to come and pick up the doctors notes and disc that were requested 11/26 ....   Advised the envelope is still there (checked front deck this morning) .Marland Kitchen...    Patient will come by tomorrow and get the envelope .Marland Kitchen...      J. Abbott -- 05/31/2023

## 2023-06-17 ENCOUNTER — Other Ambulatory Visit: Payer: Medicare (Managed Care)

## 2023-06-30 ENCOUNTER — Ambulatory Visit: Payer: Medicare (Managed Care) | Admitting: Podiatry

## 2023-06-30 ENCOUNTER — Encounter: Payer: Self-pay | Admitting: Podiatry

## 2023-06-30 DIAGNOSIS — M14671 Charcot's joint, right ankle and foot: Secondary | ICD-10-CM

## 2023-06-30 DIAGNOSIS — Q666 Other congenital valgus deformities of feet: Secondary | ICD-10-CM

## 2023-06-30 NOTE — Progress Notes (Signed)
 Subjective:  Patient ID: [name], female    DOB: 1950-07-16,  MRN: [id number]  Chief Complaint  Patient presents with   Foot Pain    It's not good.  It hurts, it hurt last night bad.  I want him to check the other foot to make sure it's not doing it.    73 y.o. female presents with the above complaint.  Patient presents for follow-up of right Charcot deformity.  She states is doing better.  The swelling and redness is gone down considerably.  She wants to discuss next treatment plan.  She denies any other acute complaints   Review of Systems: Negative except as noted in the HPI. Denies N/V/F/Ch.  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 Excised 05/27/2020, margins free.    Current Outpatient Medications:    allopurinol  (ZYLOPRIM ) 300 MG tablet, TAKE 1 TABLET(300 MG) BY MOUTH DAILYTAKE 1 TABLET(300 MG) BY MOUTH DAILY, Disp: 90 tablet, Rfl: 1   Ascorbic Acid (VITAMIN C) 500 MG CHEW, Chew 2 tablets by mouth daily., Disp: , Rfl:    celecoxib (CELEBREX) 200 MG capsule, Take 200 mg by mouth daily., Disp: , Rfl:    Cholecalciferol (VITAMIN D-1000 MAX ST) 25 MCG (1000 UT) tablet, Take 1 tablet by mouth daily., Disp: , Rfl:    ciclopirox  (PENLAC ) 8 % solution, Apply topically at bedtime. Apply over nail and surrounding skin. Apply daily over previous coat. After seven (7) days, may remove with alcohol and continue cycle., Disp: 6.6 mL, Rfl: 0   colchicine  0.6 MG tablet, Take 2 tablets (1.2mg ) by mouth on day one, take an additional 1  tablet one hour after first two tablets.  Take 1 tablet by mouth daily for 6 additional days, Disp: 30 tablet, Rfl: 1   gabapentin  (NEURONTIN ) 100 MG capsule, TAKE 1 CAPSULE(100 MG) BY MOUTH AT BEDTIME, Disp: 90 capsule, Rfl: 1   hydroquinone  4 % cream, Apply to dark spots twice a day until improved., Disp: 28.35 g, Rfl: 1   levothyroxine  (SYNTHROID ) 75 MCG tablet, TAKE 1 TABLET BY MOUTH DAILY, Disp: 90 tablet, Rfl: 1   Multiple Vitamins-Minerals (ZINC PO), Take by mouth., Disp: , Rfl:    Naproxen Sodium 220 MG CAPS, Take 1 tablet by mouth at bedtime as needed., Disp: , Rfl:    omeprazole  (PRILOSEC) 20 MG capsule, Take 1 capsule (20 mg total) by mouth daily., Disp: 90 capsule, Rfl: 3   potassium chloride  (KLOR-CON  M) 10 MEQ tablet, Take 1 tablet (10 mEq total) by mouth daily., Disp: 90 tablet, Rfl: 0   simvastatin  (ZOCOR ) 40 MG tablet, TAKE 1 TABLET(40 MG) BY MOUTH DAILY, Disp: 90 tablet, Rfl: 3   tacrolimus  (PROTOPIC ) 0.1 % ointment, Apply topically 2 (two) times daily. Bid to scar on left calf and rash around left eye until clear, then prn flares, Disp: 60 g, Rfl: 1   terbinafine  (LAMISIL ) 250 MG tablet, Take 1 tablet (250 mg total) by mouth daily.,  Disp: 90 tablet, Rfl: 0   triamterene -hydrochlorothiazide (DYAZIDE) 37.5-25 MG capsule, TAKE 1 CAPSULE BY MOUTH EVERY DAY, Disp: 90 capsule, Rfl: 2   zolpidem  (AMBIEN ) 5 MG tablet, Take 1 tablet (5 mg total) by mouth at bedtime as needed for sleep., Disp: 21 tablet, Rfl: 0   ipratropium (ATROVENT ) 0.03 % nasal spray, Place into the nose., Disp: , Rfl:   Social History   Tobacco Use  Smoking Status Never  Smokeless Tobacco Never    No Known Allergies Objective:  There were no vitals filed for this visit. There is no height or weight on file to calculate BMI. Constitutional Well developed. Well nourished.  Vascular Dorsalis pedis pulses palpable bilaterally. Posterior tibial pulses palpable bilaterally. Capillary refill normal to all digits.   No cyanosis or clubbing noted. Pedal hair growth normal.  Neurologic Normal speech. Oriented to person, place, and time. Epicritic sensation to light touch grossly present bilaterally.  Dermatologic Nails well groomed and normal in appearance. No open wounds. No skin lesions.  Orthopedic: Charcot deformity/Charcot foot noted.  No open wounds or lesion noted.  Rocker-bottom deformity noted.  Positive Silfverskiold test with gastrocnemius equinus noted.  Severe flatfoot deformity noted.   Radiographs: 3 views of skeletally mature right foot: Foot Charcot notedHead with multiple bony fragmentation to the Lisfranc joints.  No gross dislocation noted no open wounds or lesion noted no gas infection noted Assessment:   1. Charcot joint of right foot     Plan:  Patient was evaluated and treated and all questions answered.  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  I discussed with her shoe gear modification and management.  She can discontinue the boot.  I encouraged her to place herself back in the boot if she starts having flareup.  She is still awaiting Crow walker. -Patient is awaiting Crow walker appointment to be casted. -She could also benefit from diabetic insoles as well.  Given the nature of the deformity.  She will discuss this with Trish No follow-ups on file.

## 2023-07-05 ENCOUNTER — Ambulatory Visit: Payer: Medicare (Managed Care) | Admitting: Dermatology

## 2023-07-05 ENCOUNTER — Encounter: Payer: Self-pay | Admitting: Dermatology

## 2023-07-05 DIAGNOSIS — Z85828 Personal history of other malignant neoplasm of skin: Secondary | ICD-10-CM

## 2023-07-05 DIAGNOSIS — L814 Other melanin hyperpigmentation: Secondary | ICD-10-CM

## 2023-07-05 DIAGNOSIS — L910 Hypertrophic scar: Secondary | ICD-10-CM | POA: Diagnosis not present

## 2023-07-05 DIAGNOSIS — L578 Other skin changes due to chronic exposure to nonionizing radiation: Secondary | ICD-10-CM | POA: Diagnosis not present

## 2023-07-05 DIAGNOSIS — D2362 Other benign neoplasm of skin of left upper limb, including shoulder: Secondary | ICD-10-CM

## 2023-07-05 DIAGNOSIS — L821 Other seborrheic keratosis: Secondary | ICD-10-CM | POA: Diagnosis not present

## 2023-07-05 DIAGNOSIS — D239 Other benign neoplasm of skin, unspecified: Secondary | ICD-10-CM

## 2023-07-05 DIAGNOSIS — Z85820 Personal history of malignant melanoma of skin: Secondary | ICD-10-CM

## 2023-07-05 DIAGNOSIS — L905 Scar conditions and fibrosis of skin: Secondary | ICD-10-CM

## 2023-07-05 DIAGNOSIS — Z1283 Encounter for screening for malignant neoplasm of skin: Secondary | ICD-10-CM

## 2023-07-05 DIAGNOSIS — L309 Dermatitis, unspecified: Secondary | ICD-10-CM

## 2023-07-05 DIAGNOSIS — D492 Neoplasm of unspecified behavior of bone, soft tissue, and skin: Secondary | ICD-10-CM | POA: Diagnosis not present

## 2023-07-05 DIAGNOSIS — Z7189 Other specified counseling: Secondary | ICD-10-CM

## 2023-07-05 DIAGNOSIS — Z86018 Personal history of other benign neoplasm: Secondary | ICD-10-CM

## 2023-07-05 DIAGNOSIS — L570 Actinic keratosis: Secondary | ICD-10-CM

## 2023-07-05 DIAGNOSIS — D1801 Hemangioma of skin and subcutaneous tissue: Secondary | ICD-10-CM

## 2023-07-05 DIAGNOSIS — D229 Melanocytic nevi, unspecified: Secondary | ICD-10-CM

## 2023-07-05 DIAGNOSIS — W908XXA Exposure to other nonionizing radiation, initial encounter: Secondary | ICD-10-CM

## 2023-07-05 NOTE — Patient Instructions (Addendum)
 Cryotherapy Aftercare  Wash gently with soap and water everyday.   Apply Vaseline and Band-Aid daily until healed.     Area on left medial calf: Use Amlactin cream every day to help with peeling.  Can use Aquaphor to area at least twice daily.     Wound Care Instructions  Cleanse wound gently with soap and water once a day then pat dry with clean gauze. Apply a thin coat of Petrolatum (petroleum jelly, Vaseline) over the wound (unless you have an allergy to this). We recommend that you use a new, sterile tube of Vaseline. Do not pick or remove scabs. Do not remove the yellow or white healing tissue from the base of the wound.  Cover the wound with fresh, clean, nonstick gauze and secure with paper tape. You may use Band-Aids in place of gauze and tape if the wound is small enough, but would recommend trimming much of the tape off as there is often too much. Sometimes Band-Aids can irritate the skin.  You should call the office for your biopsy report after 1 week if you have not already been contacted.  If you experience any problems, such as abnormal amounts of bleeding, swelling, significant bruising, significant pain, or evidence of infection, please call the office immediately.  FOR ADULT SURGERY PATIENTS: If you need something for pain relief you may take 1 extra strength Tylenol (acetaminophen) AND 2 Ibuprofen (200mg  each) together every 4 hours as needed for pain. (do not take these if you are allergic to them or if you have a reason you should not take them.) Typically, you may only need pain medication for 1 to 3 days.       Recommend daily broad spectrum sunscreen SPF 30+ to sun-exposed areas, reapply every 2 hours as needed. Call for new or changing lesions.  Staying in the shade or wearing long sleeves, sun glasses (UVA+UVB protection) and wide brim hats (4-inch brim around the entire circumference of the hat) are also recommended for sun protection.     Melanoma  ABCDEs  Melanoma is the most dangerous type of skin cancer, and is the leading cause of death from skin disease.  You are more likely to develop melanoma if you: Have light-colored skin, light-colored eyes, or red or blond hair Spend a lot of time in the sun Tan regularly, either outdoors or in a tanning bed Have had blistering sunburns, especially during childhood Have a close family member who has had a melanoma Have atypical moles or large birthmarks  Early detection of melanoma is key since treatment is typically straightforward and cure rates are extremely high if we catch it early.   The first sign of melanoma is often a change in a mole or a new dark spot.  The ABCDE system is a way of remembering the signs of melanoma.  A for asymmetry:  The two halves do not match. B for border:  The edges of the growth are irregular. C for color:  A mixture of colors are present instead of an even brown color. D for diameter:  Melanomas are usually (but not always) greater than 6mm - the size of a pencil eraser. E for evolution:  The spot keeps changing in size, shape, and color.  Please check your skin once per month between visits. You can use a small mirror in front and a large mirror behind you to keep an eye on the back side or your body.   If you see any new  or changing lesions before your next follow-up, please call to schedule a visit.  Please continue daily skin protection including broad spectrum sunscreen SPF 30+ to sun-exposed areas, reapplying every 2 hours as needed when you're outdoors.   Staying in the shade or wearing long sleeves, sun glasses (UVA+UVB protection) and wide brim hats (4-inch brim around the entire circumference of the hat) are also recommended for sun protection.      Due to recent changes in healthcare laws, you may see results of your pathology and/or laboratory studies on MyChart before the doctors have had a chance to review them. We understand that in some  cases there may be results that are confusing or concerning to you. Please understand that not all results are received at the same time and often the doctors may need to interpret multiple results in order to provide you with the best plan of care or course of treatment. Therefore, we ask that you please give us  2 business days to thoroughly review all your results before contacting the office for clarification. Should we see a critical lab result, you will be contacted sooner.   If You Need Anything After Your Visit  If you have any questions or concerns for your doctor, please call our main line at [phone] and press option 4 to reach your doctor's medical assistant. If no one answers, please leave a voicemail as directed and we will return your call as soon as possible. Messages left after 4 pm will be answered the following business day.   You may also send us  a message via MyChart. We typically respond to MyChart messages within 1-2 business days.  For prescription refills, please ask your pharmacy to contact our office. Our fax number is [phone].  If you have an urgent issue when the clinic is closed that cannot wait until the next business day, you can page your doctor at the number below.    Please note that while we do our best to be available for urgent issues outside of office hours, we are not available 24/7.   If you have an urgent issue and are unable to reach us , you may choose to seek medical care at your doctor's office, retail clinic, urgent care center, or emergency room.  If you have a medical emergency, please immediately call 911 or go to the emergency department.  Pager Numbers  - Dr. Hester: [phone]  - Dr. Jackquline: [phone]  - Dr. Claudene: [phone]   In the event of inclement weather, please call our main line at [phone] for an update on the status of any delays or closures.  Dermatology Medication Tips: Please keep the boxes that  topical medications come in in order to help keep track of the instructions about where and how to use these. Pharmacies typically print the medication instructions only on the boxes and not directly on the medication tubes.   If your medication is too expensive, please contact our office at [phone] option 4 or send us  a message through MyChart.   We are unable to tell what your co-pay for medications will be in advance as this is different depending on your insurance coverage. However, we may be able to find a substitute medication at lower cost or fill out paperwork to get insurance to cover a needed medication.   If a prior authorization is required to get your medication covered by your insurance company, please allow us  1-2 business days to complete this process.  Drug prices  often vary depending on where the prescription is filled and some pharmacies may offer cheaper prices.  The website www.goodrx.com contains coupons for medications through different pharmacies. The prices here do not account for what the cost may be with help from insurance (it may be cheaper with your insurance), but the website can give you the price if you did not use any insurance.  - You can print the associated coupon and take it with your prescription to the pharmacy.  - You may also stop by our office during regular business hours and pick up a GoodRx coupon card.  - If you need your prescription sent electronically to a different pharmacy, notify our office through [facility] or by phone at [phone] option 4.     Si Usted Necesita Algo Despus de Su Visita  Tambin puede enviarnos un mensaje a travs de Clinical Cytogeneticist. Por lo general respondemos a los mensajes de MyChart en el transcurso de 1 a 2 das hbiles.  Para renovar recetas, por favor pida a su farmacia que se ponga en contacto con nuestra oficina. Randi lakes de fax es [place] [phone].  Si tiene un asunto urgente cuando la clnica  est cerrada y que no puede esperar hasta el siguiente da hbil, puede llamar/localizar a su doctor(a) al nmero que aparece a continuacin.   Por favor, tenga en cuenta que aunque hacemos todo lo posible para estar disponibles para asuntos urgentes fuera del horario de [place], no estamos disponibles las 24 horas del da, los 7 [address]  [phone] de la [place].   Si tiene un problema urgente y no puede comunicarse con nosotros, puede optar por buscar atencin mdica  en el consultorio de su doctor(a), en una clnica privada, en un centro de atencin urgente o en una sala de emergencias.  Si tiene engineer, drilling, por favor llame inmediatamente al 911 o vaya a la sala de emergencias.  Nmeros de bper  - Dr. Hester: [phone]  - Dra. Jackquline: [phone]  - Dr. Claudene: [phone]   En caso de inclemencias del tiempo, por favor llame a landry capes principal al [phone] para una actualizacin sobre el [place] de cualquier retraso o cierre.  Consejos para la medicacin en dermatologa: Por favor, guarde las cajas en las que vienen los medicamentos de uso tpico para ayudarle a seguir las instrucciones sobre dnde y cmo usarlos. Las farmacias generalmente imprimen las instrucciones del medicamento slo en las cajas y no directamente en los tubos del [place].   Si su medicamento es muy caro, por favor, pngase en contacto con landry rieger llamando al [phone] y presione la opcin 4 o envenos un mensaje a travs de Clinical Cytogeneticist.   No podemos decirle cul ser su copago por los medicamentos por adelantado ya que esto es diferente dependiendo de la cobertura de su seguro. Sin embargo, es posible que podamos encontrar un medicamento sustituto a audiological scientist un formulario para que el seguro cubra el medicamento que se considera necesario.   Si se requiere una autorizacin previa para que su compaa de seguros cubra su medicamento, por favor permtanos de 1 a 2 das hbiles para  completar este proceso.  Los precios de los medicamentos varan con frecuencia dependiendo del environmental consultant de dnde se surte la receta y alguna farmacias pueden ofrecer precios ms baratos.  El sitio web www.goodrx.com tiene cupones para medicamentos de health and safety inspector. Los precios aqu no tienen en cuenta lo que podra costar con la ayuda del seguro (puede ser ms barato  con su seguro), pero el sitio web puede darle el precio si no visual merchandiser.  - Puede imprimir el cupn correspondiente y llevarlo con su receta a la farmacia.  - Tambin puede pasar por nuestra oficina durante el horario de atencin regular y education officer, museum una tarjeta de cupones de GoodRx.  - Si necesita que su receta se enve electrnicamente a una farmacia diferente, informe a nuestra oficina a travs de MyChart de [place] o por telfono llamando al [phone] y presione la opcin 4.

## 2023-07-05 NOTE — Progress Notes (Signed)
 Follow-Up Visit   Subjective  [name] is a 73 y.o. female who presents for the following: Skin Cancer Screening and Full Body Skin Exam. Hx of MM. Hx of SCC. Hx of BCC. Hx of AKs. Hx of dysplastic nevus.  C/O dry pink spots on hands.   Recheck lesion on left dorsal foot. Has been using hydroquinone  cream. Hasn't helped a great deal but has faded some.   The patient presents for Total-Body Skin Exam (TBSE) for skin cancer screening and mole check. The patient has spots, moles and lesions to be evaluated, some may be new or changing and the patient may have concern these could be cancer.    The following portions of the chart were reviewed this encounter and updated as appropriate: medications, allergies, medical history  Review of Systems:  No other skin or systemic complaints except as noted in HPI or Assessment and Plan.  Objective  Well appearing patient in no apparent distress; mood and affect are within normal limits.  A full examination was performed including scalp, head, eyes, ears, nose, lips, neck, chest, axillae, abdomen, back, buttocks, bilateral upper extremities, bilateral lower extremities, hands, feet, fingers, toes, fingernails, and toenails. All findings within normal limits unless otherwise noted below.   Relevant physical exam findings are noted in the Assessment and Plan.           Right Hand - Dorsum x6, left hand dorsum x5 (11) Erythematous thin papules/macules with gritty scale.  Left Foot - Dorsum 2.5 cm x 1.5 cm tan patch  Spinal Mid Back 6 x 4 mm pink smooth, firm papule   Assessment & Plan   HISTORY OF MELANOMA. Chest. 2016. - No evidence of recurrence today - Recommend regular full body skin exams - Recommend daily broad spectrum sunscreen SPF 30+ to sun-exposed areas, reapply every 2 hours as needed.  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 Excised 05/27/2020. - No evidence of recurrence today - Recommend regular full body skin exams - Recommend daily broad spectrum sunscreen SPF 30+ to sun-exposed areas, reapply every 2 hours as needed.  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 05/31/2019. - No evidence of recurrence today - Recommend regular full body skin exams - Recommend daily broad spectrum sunscreen SPF 30+ to sun-exposed areas, reapply every 2 hours as needed.  - Call if any new or changing lesions are noted between office visits  HISTORY OF DYSPLASTIC NEVUS. Upper mid back, moderate. 01/21/2021. No evidence of recurrence today Recommend regular full body skin exams Recommend daily broad spectrum sunscreen SPF 30+ to sun-exposed areas, reapply every 2 hours as needed.  Call if any new or changing lesions are noted between office visits    SKIN CANCER SCREENING PERFORMED TODAY.  ACTINIC DAMAGE - Chronic condition, secondary to cumulative UV/sun exposure - diffuse scaly erythematous macules with underlying dyspigmentation - Recommend daily broad spectrum sunscreen SPF 30+ to sun-exposed areas, reapply every 2 hours as needed.  - Staying in the shade or wearing long sleeves, sun glasses (UVA+UVB protection) and wide brim hats (4-inch brim around the entire circumference of the hat) are also recommended for sun protection.  - Call for new or changing lesions.  LENTIGINES, SEBORRHEIC KERATOSES, HEMANGIOMAS - Benign normal skin lesions - Benign-appearing - Call for  any changes  MELANOCYTIC NEVI - Tan-brown and/or pink-flesh-colored symmetric macules and papules - Benign appearing on exam today - Observation - Call clinic for new or changing moles - Recommend daily use of broad spectrum spf 30+ sunscreen to sun-exposed areas.   SEBORRHEIC KERATOSIS. B/L dorsal hands. - Stuck-on, waxy, tan-brown  macules - Benign-appearing - Discussed benign etiology and prognosis. - Observe - Call for any changes Counseling for BBL / IPL / Laser and Coordination of Care Discussed the treatment option of Broad Band Light (BBL) /Intense Pulsed Light (IPL)/ Laser for skin discoloration, including brown spots and redness.  Typically we recommend at least 1-3 treatment sessions about 5-8 weeks apart for best results.  Cannot have tanned skin when BBL performed, and regular use of sunscreen/photoprotection is advised after the procedure to help maintain results. The patient's condition may also require maintenance treatments in the future.  The fee for BBL / laser treatments is $350 per treatment session for the whole face.  A fee can be quoted for other parts of the body.  Insurance typically does not pay for BBL/laser treatments and therefore the fee is an out-of-pocket cost. Recommend prophylactic valtrex treatment. Once scheduled for procedure, will send Rx in prior to patient's appointment.     SCAR, irritated (with possible Psoriasis/Dermatitis- pt has h/o psoriasis) at [facility] site L medial calf Exam: hyperpigmented patch with central pink scaly slightly indurated plaque, see photo   Chronic and persistent condition with duration or expected duration over one year. Condition is bothersome/symptomatic for patient. Currently flared.    Treatment:   Use Amlactin cream every day Can use Aquaphor to scaly areas as needed.    DERMATITIS secondary to irritation from boot, VS PSORISIS   Exam: light pink scaly patches lower right anterior leg   Chronic and persistent condition with duration or expected duration over one year. Condition is bothersome/symptomatic for patient. Currently flared.    Treatment Plan: Use TMC 0.1% cr bid for 2 wks then d/c In 2 weeks start Tacrolimus  0.1% ointment qd/bid until clear, then prn flares Start Amlactin cr qd   Recommend gentle skin care    DERMATOFIBROMA Exam:  Firm pink/brown papulenodule with dimple sign at left posterior shoulder. Treatment Plan: A dermatofibroma is a benign growth possibly related to trauma, such as an insect bite, cut from shaving, or inflamed acne-type bump.  Treatment options to remove include shave or excision with resulting scar and risk of recurrence.  Since benign-appearing and not bothersome, will observe for now.    AK (ACTINIC KERATOSIS) (11) Right Hand - Dorsum x6, left hand dorsum x5 (11) Actinic keratoses are precancerous spots that appear secondary to cumulative UV radiation exposure/sun exposure over time. They are chronic with expected duration over 1 year. A portion of actinic keratoses will progress to squamous cell carcinoma of the skin. It is not possible to reliably predict which spots will progress to skin cancer and so treatment is recommended to prevent development of skin cancer.  Recommend daily broad spectrum sunscreen SPF 30+ to sun-exposed areas, reapply every 2 hours as needed.  Recommend staying in the shade or wearing long sleeves, sun glasses (UVA+UVB protection) and wide brim hats (4-inch brim around the entire circumference of the hat). Call for new or changing lesions. Destruction of lesion - Right Hand - Dorsum x6, left hand dorsum x5 (11)  Destruction method: cryotherapy   Informed consent: discussed and consent obtained   Lesion destroyed using liquid nitrogen: Yes   Region  frozen until ice ball extended beyond lesion: Yes   Outcome: patient tolerated procedure well with no complications   Post-procedure details: wound care instructions given   Additional details:  Prior to procedure, discussed risks of blister formation, small wound, skin dyspigmentation, or rare scar following cryotherapy. Recommend Vaseline ointment to treated areas while healing.  NEOPLASM OF SKIN (2) Left Foot - Dorsum Skin / nail biopsy Type of biopsy: tangential   Informed consent: discussed and consent obtained    Anesthesia: the lesion was anesthetized in a standard fashion   Anesthesia comment:  Area prepped with alcohol Anesthetic:  1% lidocaine w/ epinephrine 1-100,000 buffered w/ 8.4% NaHCO3 Instrument used: flexible razor blade   Hemostasis achieved with: pressure, aluminum chloride and electrodesiccation   Outcome: patient tolerated procedure well   Post-procedure details: wound care instructions given   Post-procedure details comment:  Ointment and small bandage applied Specimen 1 - Surgical pathology Differential Diagnosis: Lentigo vs post inflammatory hyperpigmentation, R/O atypia  Check Margins: NO 2 pieces in bottle   Patient has H/O MM Spinal Mid Back Skin / nail biopsy Type of biopsy: tangential   Informed consent: discussed and consent obtained   Anesthesia: the lesion was anesthetized in a standard fashion   Anesthesia comment:  Area prepped with alcohol Anesthetic:  1% lidocaine w/ epinephrine 1-100,000 buffered w/ 8.4% NaHCO3 Instrument used: flexible razor blade   Hemostasis achieved with: pressure, aluminum chloride and electrodesiccation   Outcome: patient tolerated procedure well   Post-procedure details: wound care instructions given   Post-procedure details comment:  Ointment and small bandage applied Specimen 2 - Surgical pathology Differential Diagnosis: dermatofibroma vs irritated nevus, R/O atypia  Check Margins: No  Patient has H/O MM Dr. Claudene examined patient and agreed with biopsy today.    Return in about 6 months (around 01/02/2024) for TBSE, HxMM, HxSCC, HxBCC, HxDN.  I, Jill Parcell, CMA, am acting as scribe for Rexene Rattler, MD.   Documentation: I have reviewed the above documentation for accuracy and completeness, and I agree with the above.  Rexene Rattler, MD

## 2023-07-08 LAB — SURGICAL PATHOLOGY

## 2023-07-11 ENCOUNTER — Telehealth: Payer: Self-pay

## 2023-07-11 NOTE — Telephone Encounter (Signed)
-----   Message from Willeen Niece sent at 07/11/2023  1:43 PM EST ----- 1. Skin, left foot - dorsum :       PIGMENTED SEBORRHEIC KERATOSIS   2. Skin, spinal mid back :       KELOID    1. Benign SK, no further treatment needed 2. Benign thickened scar tissue, no further treatment needed - please call patient

## 2023-07-11 NOTE — Telephone Encounter (Signed)
Advised patient both biopsy results were benign and no further treatment needed.

## 2023-07-15 ENCOUNTER — Ambulatory Visit: Payer: Medicare (Managed Care)

## 2023-07-27 DIAGNOSIS — M14671 Charcot's joint, right ankle and foot: Secondary | ICD-10-CM | POA: Diagnosis not present

## 2023-07-31 ENCOUNTER — Other Ambulatory Visit: Payer: Self-pay | Admitting: Family Medicine

## 2023-08-01 ENCOUNTER — Other Ambulatory Visit: Payer: Self-pay | Admitting: Family Medicine

## 2023-08-01 ENCOUNTER — Ambulatory Visit: Payer: Medicare (Managed Care) | Admitting: Family Medicine

## 2023-08-01 VITALS — BP 111/66 | HR 82 | Temp 99.7°F | Resp 16 | Wt 162.9 lb

## 2023-08-01 DIAGNOSIS — R6889 Other general symptoms and signs: Secondary | ICD-10-CM | POA: Diagnosis not present

## 2023-08-01 DIAGNOSIS — F5101 Primary insomnia: Secondary | ICD-10-CM

## 2023-08-01 LAB — POC COVID19/FLU A&B COMBO
Covid Antigen, POC: NEGATIVE
Influenza A Antigen, POC: POSITIVE — AB
Influenza B Antigen, POC: NEGATIVE

## 2023-08-01 MED ORDER — POTASSIUM CHLORIDE CRYS ER 10 MEQ PO TBCR: 10.0000 meq | EXTENDED_RELEASE_TABLET | Freq: Every day | ORAL | 0 refills | Status: DC

## 2023-08-01 MED ORDER — OSELTAMIVIR PHOSPHATE 75 MG PO CAPS: 75.0000 mg | ORAL_CAPSULE | Freq: Two times a day (BID) | ORAL | 0 refills | Status: AC

## 2023-08-01 MED ORDER — ZOLPIDEM TARTRATE 5 MG PO TABS: 5.0000 mg | ORAL_TABLET | Freq: Every evening | ORAL | 0 refills | Status: DC | PRN

## 2023-08-01 NOTE — Progress Notes (Signed)
 Established patient visit   Patient: [name]   DOB: 1950/10/18   73 y.o. Female  MRN: [id number] Visit Date: 08/01/2023  Today's healthcare provider: Jeralene Mom, MD   Chief Complaint  Patient presents with   URI    Symptoms started on the weekend. Patient reports that she was feeling weak and called the ambulance on Saturday.  Patient has been running a temperature highest 101.6    Subjective    Discussed the use of AI scribe software for clinical note transcription with the patient, who gave verbal consent to proceed.  History of Present Illness   [name] "[name]" is a 73 year old female who presents with flu-like symptoms.  She has been experiencing flu-like symptoms for the last 3 days, beginning with an irritating cough that worsened by Saturday. By Saturday night, she experienced severe weakness and increased coughing, prompting her to call an ambulance. She has had a fever with temperatures reaching 101.51F and 101.37F on subsequent nights. Her symptoms include a persistent cough producing clear phlegm, ear discomfort, headaches, and body aches. No shortness of breath or sore throat is noted.  She has been taking Tylenol approximately every four hours and Mucinex DM to manage her symptoms. A COVID test was performed and returned negative.  Her husband is immunocompromised due to cancer and recently had an infusion       Medications: Outpatient Medications Prior to Visit  Medication Sig   allopurinol  (ZYLOPRIM ) 300 MG tablet TAKE 1 TABLET(300 MG) BY MOUTH DAILYTAKE 1 TABLET(300 MG) BY MOUTH DAILY   Ascorbic Acid (VITAMIN C) 500 MG CHEW Chew 2 tablets by mouth daily.   celecoxib (CELEBREX) 200 MG capsule Take 200 mg by mouth daily.   Cholecalciferol (VITAMIN D-1000 MAX ST) 25 MCG (1000 UT) tablet Take 1 tablet by mouth daily.   ciclopirox  (PENLAC ) 8 % solution Apply topically at bedtime. Apply over nail and surrounding skin. Apply daily over  previous coat. After seven (7) days, may remove with alcohol and continue cycle.   colchicine  0.6 MG tablet Take 2 tablets (1.2mg ) by mouth on day one, take an additional 1 tablet one hour after first two tablets.  Take 1 tablet by mouth daily for 6 additional days   gabapentin  (NEURONTIN ) 100 MG capsule TAKE 1 CAPSULE(100 MG) BY MOUTH AT BEDTIME   hydroquinone  4 % cream Apply to dark spots twice a day until improved.   ipratropium (ATROVENT ) 0.03 % nasal spray Place into the nose.   levothyroxine  (SYNTHROID ) 75 MCG tablet TAKE 1 TABLET BY MOUTH DAILY   Multiple Vitamins-Minerals (ZINC PO) Take by mouth.   Naproxen Sodium 220 MG CAPS Take 1 tablet by mouth at bedtime as needed.   omeprazole  (PRILOSEC) 20 MG capsule Take 1 capsule (20 mg total) by mouth daily.   potassium chloride  (KLOR-CON  M) 10 MEQ tablet Take 1 tablet (10 mEq total) by mouth daily.   simvastatin  (ZOCOR ) 40 MG tablet TAKE 1 TABLET(40 MG) BY MOUTH DAILY   tacrolimus  (PROTOPIC ) 0.1 % ointment Apply topically 2 (two) times daily. Bid to scar on left calf and rash around left eye until clear, then prn flares   terbinafine  (LAMISIL ) 250 MG tablet Take 1 tablet (250 mg total) by mouth daily.   triamterene -hydrochlorothiazide (DYAZIDE) 37.5-25 MG capsule TAKE 1 CAPSULE BY MOUTH EVERY DAY   zolpidem  (AMBIEN ) 5 MG tablet Take 1 tablet (5 mg total) by mouth at bedtime as needed for sleep.   No facility-administered  medications prior to visit.   Review of Systems  Constitutional:  Positive for chills, fatigue and fever. Negative for appetite change.  Respiratory:  Positive for cough and wheezing. Negative for chest tightness and shortness of breath.   Cardiovascular:  Negative for chest pain and palpitations.  Gastrointestinal:  Negative for abdominal pain, nausea and vomiting.  Neurological:  Negative for dizziness and weakness.       Objective    BP 111/66 (BP Location: Left Arm, Patient Position: Sitting, Cuff Size: Normal)    Pulse 82   Temp 99.7 F (37.6 C) (Oral)   Resp 16   Wt 162 lb 14.4 oz (73.9 kg)   LMP  (LMP Unknown) Comment: 1980s  SpO2 98%   BMI 27.96 kg/m    Physical Exam    General: Appearance:    Well developed, well nourished female in no acute distress  Eyes:    PERRL, conjunctiva/corneas clear, EOM's intact       Lungs:     Clear to auscultation bilaterally, respirations unlabored  Heart:    Normal heart rate. Normal rhythm. No murmurs, rubs, or gallops.    MS:   All extremities are intact.    Neurologic:   Awake, alert, oriented x 3. No apparent focal neurological defect.           Results for orders placed or performed in visit on 08/01/23  POC Covid19/Flu A&B Antigen  Result Value Ref Range   Influenza A Antigen, POC Positive (A) Negative   Influenza B Antigen, POC Negative Negative   Covid Antigen, POC Negative Negative    Assessment & Plan        Influenza Acute onset of cough, fever, and body aches since Friday. Clear sputum production. No shortness of breath. Lungs clear on auscultation. Negative COVID test. -Start Tamiflu  twice daily. -Continue symptomatic treatment with Tylenol and Mucinex as needed.  Preventive care for spouse Spouse is immunocompromised due to cancer treatment and at high risk for complications if infected with influenza. -Prescribe Tamiflu  for spouse as prophylaxis, to be taken once daily. Advised flu vaccine would not protect from current exposure to his wife, but would be effective at reducing risk from exposures after 1-2 weeks.    No follow-ups on file.      Jeralene Mom, MD  [facility] [phone] (phone) [phone] (fax)  [facility]

## 2023-08-02 ENCOUNTER — Other Ambulatory Visit: Payer: Medicare (Managed Care)

## 2023-08-09 ENCOUNTER — Other Ambulatory Visit: Payer: Medicare (Managed Care)

## 2023-08-26 ENCOUNTER — Ambulatory Visit: Payer: Medicare (Managed Care)

## 2023-08-27 IMAGING — MR MR LUMBAR SPINE W/O CM
4 of 5 series · 32 of 48 positions shown · non-contrast
Comparison: X-ray lumbar 12/31/2019.

CLINICAL DATA: Patient reports back spasms since [place]. Patient
complains of pulling, tugging and sharp pain down both legs to the
feet with numbness, but worse on right side.

EXAM:
MRI LUMBAR SPINE WITHOUT CONTRAST
TECHNIQUE: Multiplanar, multisequence MR imaging of the lumbar spine was
performed. No intravenous contrast was administered.

[Series 5: T2 · sagittal · 4.0mm · 0.81mm/px · 7 of 17 slices shown (1 of 2)]
[im 1/17]
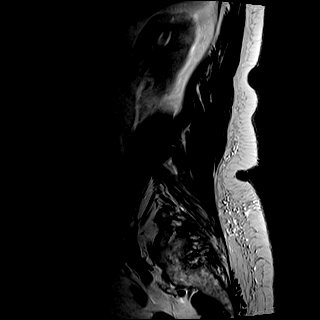
[im 3/17]
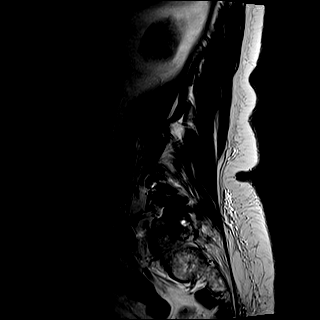
[im 6/17]
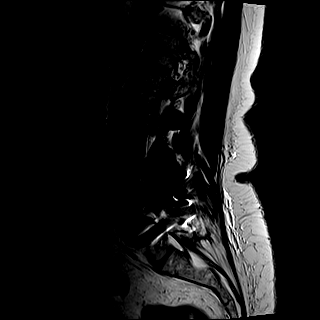
[im 9/17]
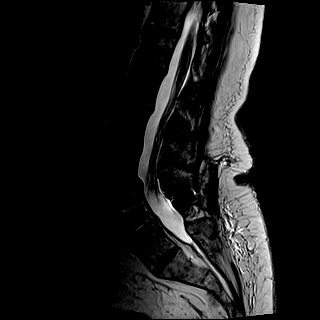
[im 11/17]
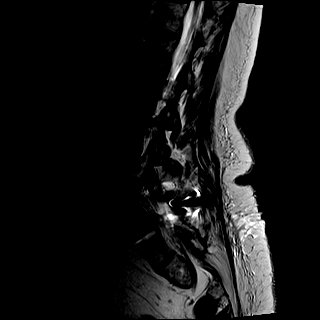
[im 14/17]
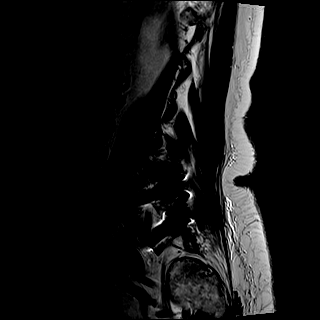
[im 17/17]
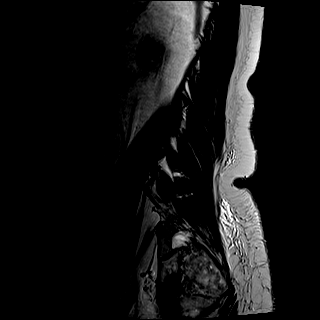

[Series 6: T1 · sagittal · 4.0mm · 0.81mm/px · 7 of 17 slices shown (1 of 2)]
[im 1/17]
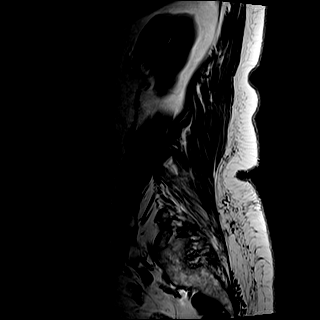
[im 3/17]
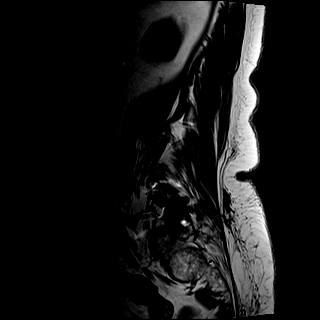
[im 6/17]
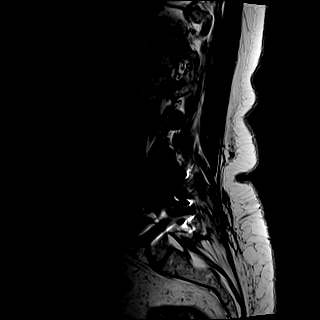
[im 9/17]
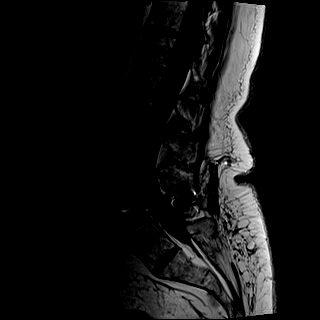
[im 11/17]
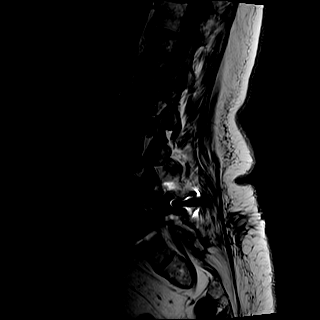
[im 14/17]
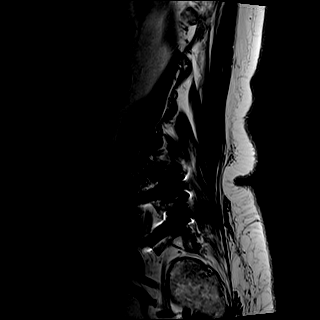
[im 17/17]
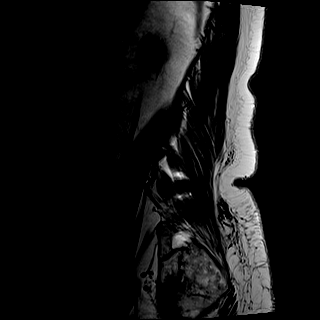

[Series 8: T2 · axial · 4.0mm · 0.78mm/px · z∈[-94,+92]mm · 9 of 28 slices shown (2 of 2)]
[im 1/28]
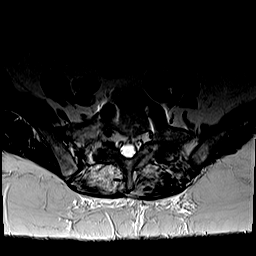
[im 5/28]
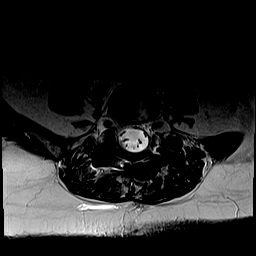
[im 10/28]
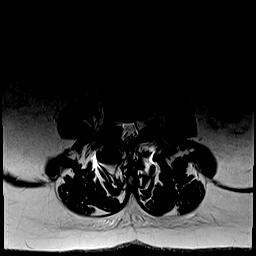
[im 12/28]
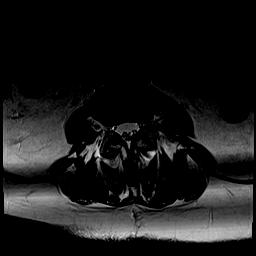
[im 14/28]
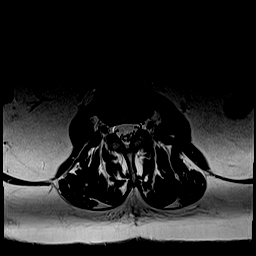
[im 16/28]
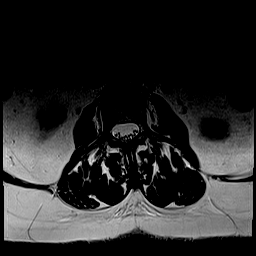
[im 19/28]
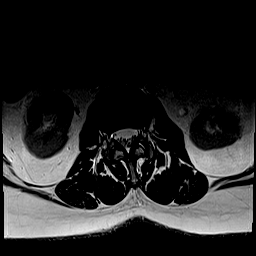
[im 23/28]
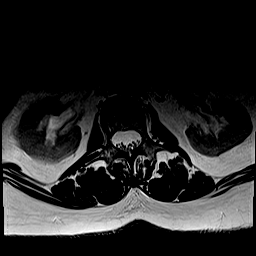
[im 28/28]
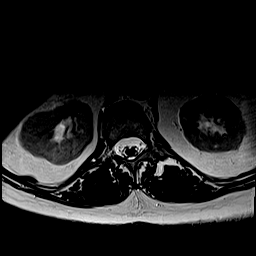

[Series 9: T1 · axial · 4.0mm · 0.39mm/px · z∈[-94,+92]mm · 9 of 28 slices shown (2 of 2)]
[im 1/28]
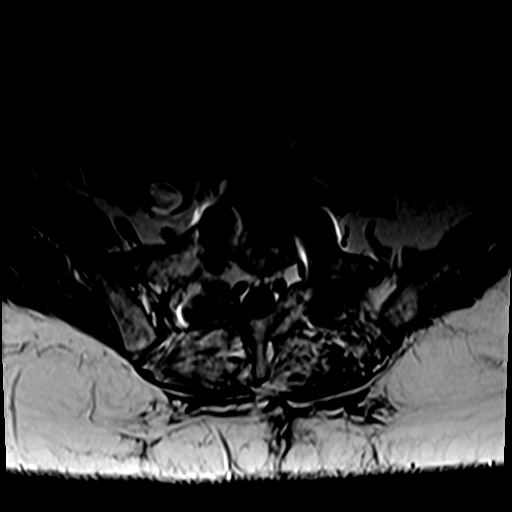
[im 5/28]
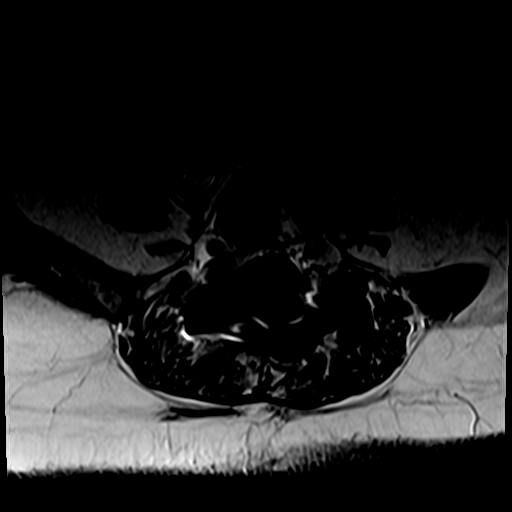
[im 10/28]
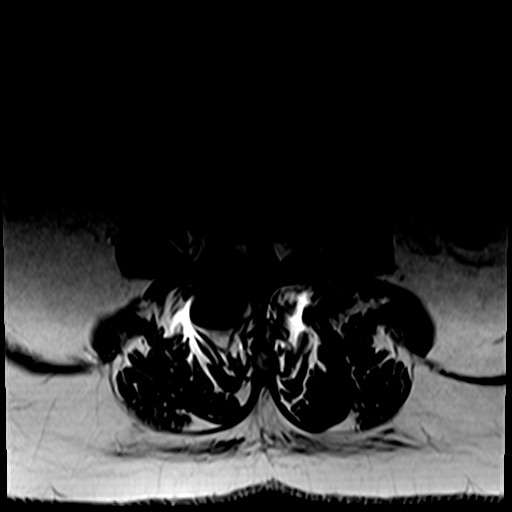
[im 12/28]
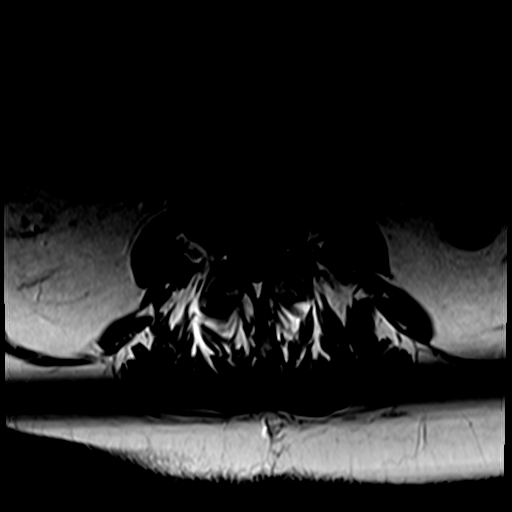
[im 14/28]
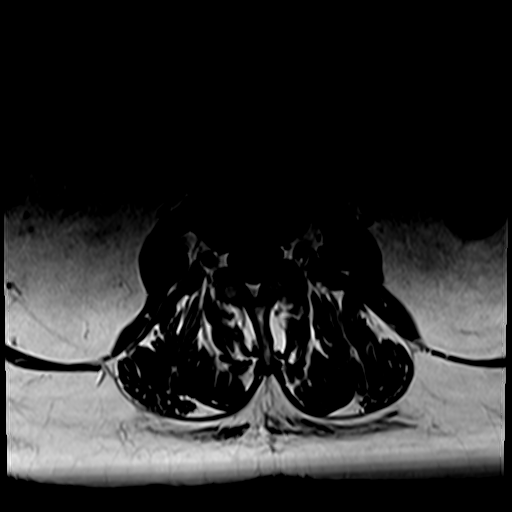
[im 16/28]
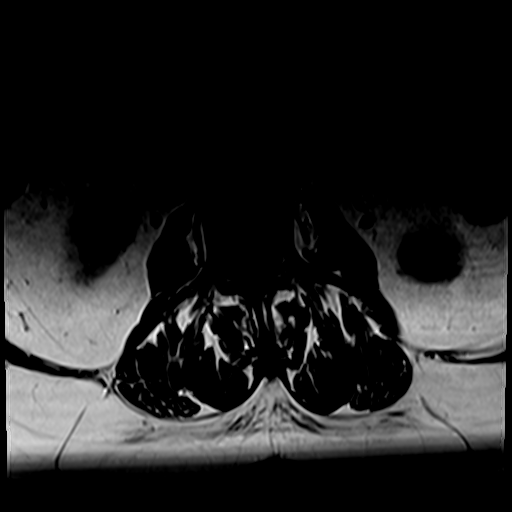
[im 19/28]
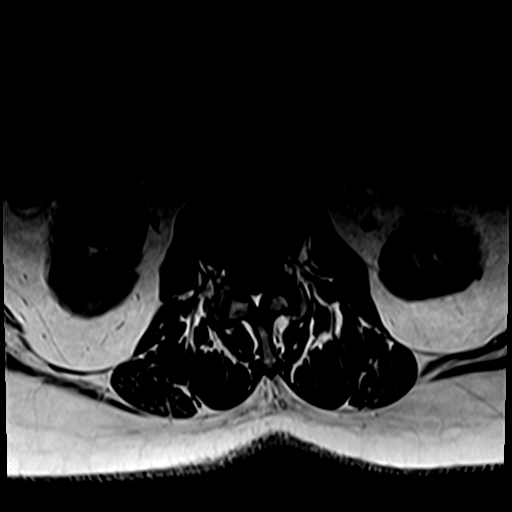
[im 23/28]
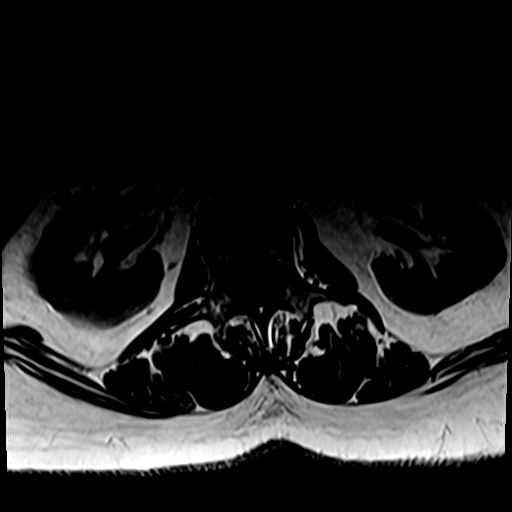
[im 28/28]
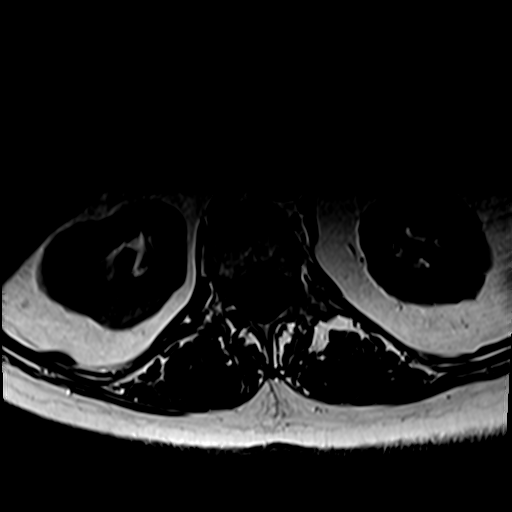

[32 of 48 positions shown; findings below may reference images not displayed]

FINDINGS: Segmentation:  Standard.

Alignment: Minimal grade 1 anterolisthesis of L3 on L4 secondary to
facet disease.

Vertebrae: No acute fracture, evidence of discitis, or aggressive
bone lesion.

Conus medullaris and cauda equina: Conus extends to the T12-L1
level. Conus and cauda equina appear normal.

Paraspinal and other soft tissues: No acute paraspinal abnormality.

Disc levels:

Disc spaces: Posterior lumbar interbody fusion and decompression at
L4-5. Severe adjacent segment degenerative disease with disc height
loss at L3-4.

T12-L1: No significant disc bulge. No neural foraminal stenosis. No
central canal stenosis.

L1-L2: No significant disc bulge. No neural foraminal stenosis. No
central canal stenosis.

L2-L3: No significant disc bulge. No neural foraminal stenosis. No
central canal stenosis. Minimal bilateral facet arthropathy.

L3-L4: Broad-based disc bulge. Severe bilateral facet arthropathy.
Moderate-severe spinal stenosis. Bilateral subarticular recess
stenosis. Mild bilateral foraminal stenosis.

L4-L5: Interbody fusion and laminectomy. No foraminal or central
canal stenosis.

L5-S1: No significant disc bulge. No neural foraminal stenosis. No
central canal stenosis.
IMPRESSION: 1. At L3-4 there is a broad-based disc bulge. Severe bilateral facet
arthropathy. Moderate-severe spinal stenosis. Bilateral subarticular
recess stenosis. Mild bilateral foraminal stenosis.
2. Posterior lumbar interbody fusion and decompression at L4-5
without foraminal or central canal stenosis.

## 2023-09-07 ENCOUNTER — Other Ambulatory Visit: Payer: Self-pay | Admitting: Family Medicine

## 2023-09-08 NOTE — Telephone Encounter (Signed)
 Requested Prescriptions  Pending Prescriptions Disp Refills   allopurinol (ZYLOPRIM) 300 MG tablet [Pharmacy Med Name: ALLOPURINOL 300MG  TABLETS] 90 tablet 0    Sig: TAKE 1 TABLET BY MOUTH DAILY     Endocrinology:  Gout Agents - allopurinol Failed - 09/08/2023 11:01 AM      Failed - Cr in normal range and within 360 days    Creatinine, Ser  Date Value Ref Range Status  01/06/2023 1.08 (H) 0.57 - 1.00 mg/dL Final         Failed - CBC within normal limits and completed in the last 12 months    WBC  Date Value Ref Range Status  01/06/2023 6.2 3.4 - 10.8 x10E3/uL Final   RBC  Date Value Ref Range Status  01/06/2023 4.28 3.77 - 5.28 x10E6/uL Final   Hemoglobin  Date Value Ref Range Status  01/06/2023 12.8 11.1 - 15.9 g/dL Final   Hematocrit  Date Value Ref Range Status  01/06/2023 38.6 34.0 - 46.6 % Final   MCHC  Date Value Ref Range Status  01/06/2023 33.2 31.5 - 35.7 g/dL Final   [facility]  Date Value Ref Range Status  01/06/2023 29.9 26.6 - 33.0 pg Final   MCV  Date Value Ref Range Status  01/06/2023 90 79 - 97 fL Final   No results found for: "PLTCOUNTKUC", "LABPLAT", "POCPLA" RDW  Date Value Ref Range Status  01/06/2023 12.8 11.7 - 15.4 % Final         Passed - Uric Acid in normal range and within 360 days    Uric Acid  Date Value Ref Range Status  01/06/2023 4.8 3.1 - 7.9 mg/dL Final    Comment:               Therapeutic target for gout patients: <6.0         Passed - Valid encounter within last 12 months    Recent Outpatient Visits           4 months ago Localized swelling of right foot   [facility] [place], [place], MD   6 months ago Acute left-sided thoracic back pain   [facility] [place], Monico Blitz, DO   8 months ago Annual physical exam    [facility] Simmons-Robinson, [place], MD   11 months ago Overweight with body mass index (BMI) of 28 to 28.9 in adult   [facility] Simmons-Robinson, Tawanna Cooler, MD   1 year ago Hypothyroidism, unspecified type   [facility] [place], Edmon Crape, PA-C       Future Appointments             In 4 months Willeen Niece, MD [facility]

## 2023-09-12 ENCOUNTER — Other Ambulatory Visit: Payer: Self-pay | Admitting: Family Medicine

## 2023-09-12 DIAGNOSIS — Z1231 Encounter for screening mammogram for malignant neoplasm of breast: Secondary | ICD-10-CM

## 2023-09-13 ENCOUNTER — Other Ambulatory Visit: Payer: Self-pay | Admitting: Physician Assistant

## 2023-09-13 ENCOUNTER — Other Ambulatory Visit: Payer: Self-pay | Admitting: Family Medicine

## 2023-09-13 DIAGNOSIS — E7849 Other hyperlipidemia: Secondary | ICD-10-CM

## 2023-09-13 DIAGNOSIS — G629 Polyneuropathy, unspecified: Secondary | ICD-10-CM

## 2023-09-14 NOTE — Telephone Encounter (Signed)
 Requested Prescriptions  Pending Prescriptions Disp Refills   simvastatin (ZOCOR) 40 MG tablet [Pharmacy Med Name: SIMVASTATIN 40MG  TABLETS] 90 tablet 0    Sig: TAKE 1 TABLET(40 MG) BY MOUTH DAILY     Cardiovascular:  Antilipid - Statins Failed - 09/14/2023 12:48 PM      Failed - Lipid Panel in normal range within the last 12 months    Cholesterol, Total  Date Value Ref Range Status  01/06/2023 168 100 - 199 mg/dL Final   LDL Chol Calc (NIH)  Date Value Ref Range Status  01/06/2023 85 0 - 99 mg/dL Final   HDL  Date Value Ref Range Status  01/06/2023 43 >39 mg/dL Final   Triglycerides  Date Value Ref Range Status  01/06/2023 242 (H) 0 - 149 mg/dL Final         Passed - Patient is not pregnant      Passed - Valid encounter within last 12 months    Recent Outpatient Visits           4 months ago Localized swelling of right foot   [place] [place], [place], MD   6 months ago Acute left-sided thoracic back pain   [facility] [place], Monico Blitz, DO   8 months ago Annual physical exam   [facility] Simmons-Robinson, [place], MD   11 months ago Overweight with body mass index (BMI) of 28 to 28.9 in adult   [facility] Simmons-Robinson, [place], MD   1 year ago Hypothyroidism, unspecified type   [facility] [place], Edmon Crape, PA-C       Future Appointments             In 2 weeks Willeen Niece, MD [facility]   In 3 months Willeen Niece, MD [facility]

## 2023-09-19 NOTE — Progress Notes (Signed)
 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

## 2023-09-23 DIAGNOSIS — M7062 Trochanteric bursitis, left hip: Secondary | ICD-10-CM | POA: Diagnosis not present

## 2023-09-26 ENCOUNTER — Ambulatory Visit: Payer: Medicare (Managed Care) | Admitting: Dermatology

## 2023-09-26 DIAGNOSIS — W908XXA Exposure to other nonionizing radiation, initial encounter: Secondary | ICD-10-CM

## 2023-09-26 DIAGNOSIS — L820 Inflamed seborrheic keratosis: Secondary | ICD-10-CM | POA: Diagnosis not present

## 2023-09-26 DIAGNOSIS — L821 Other seborrheic keratosis: Secondary | ICD-10-CM

## 2023-09-26 DIAGNOSIS — L578 Other skin changes due to chronic exposure to nonionizing radiation: Secondary | ICD-10-CM

## 2023-09-26 NOTE — Progress Notes (Signed)
   Follow-Up Visit   Subjective  [name] is a 73 y.o. female who presents for the following: spot at left forearm she noticed about a month ago. Painful, was bigger, seems to improving a bit.  The patient has spots, moles and lesions to be evaluated, some may be new or changing and the patient may have concern these could be cancer.   The following portions of the chart were reviewed this encounter and updated as appropriate: medications, allergies, medical history  Review of Systems:  No other skin or systemic complaints except as noted in HPI or Assessment and Plan.  Objective  Well appearing patient in no apparent distress; mood and affect are within normal limits.   A focused examination was performed of the following areas: Left arm   Relevant exam findings are noted in the Assessment and Plan.  left forearm x 1 Erythematous stuck-on, waxy papule  Assessment & Plan   SEBORRHEIC KERATOSIS - Stuck-on, waxy, tan-brown papules and/or plaques  - Benign-appearing - Discussed benign etiology and prognosis. - Observe - Call for any changes   ACTINIC DAMAGE - chronic, secondary to cumulative UV radiation exposure/sun exposure over time - diffuse scaly erythematous macules with underlying dyspigmentation - Recommend daily broad spectrum sunscreen SPF 30+ to sun-exposed areas, reapply every 2 hours as needed.  - Recommend staying in the shade or wearing long sleeves, sun glasses (UVA+UVB protection) and wide brim hats (4-inch brim around the entire circumference of the hat). - Call for new or changing lesions.   INFLAMED SEBORRHEIC KERATOSIS left forearm x 1 Symptomatic, irritating, patient would like treated.  If still present in June at follow up will consider bx  Destruction of lesion - left forearm x 1  Destruction method: cryotherapy   Informed consent: discussed and consent obtained   Lesion destroyed using liquid nitrogen: Yes   Region frozen until  ice ball extended beyond lesion: Yes   Outcome: patient tolerated procedure well with no complications   Post-procedure details: wound care instructions given   Additional details:  Prior to procedure, discussed risks of blister formation, small wound, skin dyspigmentation, or rare scar following cryotherapy. Recommend Vaseline ointment to treated areas while healing.   Return for keep follow up as scheduled .  I, Asher Muir, CMA, am acting as scribe for Willeen Niece, MD.   Documentation: I have reviewed the above documentation for accuracy and completeness, and I agree with the above.  Willeen Niece, MD

## 2023-09-26 NOTE — Patient Instructions (Addendum)

## 2023-09-27 ENCOUNTER — Other Ambulatory Visit: Payer: Self-pay | Admitting: Family Medicine

## 2023-09-27 DIAGNOSIS — F5101 Primary insomnia: Secondary | ICD-10-CM

## 2023-09-28 ENCOUNTER — Ambulatory Visit: Payer: Medicare (Managed Care) | Admitting: Dermatology

## 2023-09-28 DIAGNOSIS — M14671 Charcot's joint, right ankle and foot: Secondary | ICD-10-CM | POA: Diagnosis not present

## 2023-09-28 DIAGNOSIS — M84674A Pathological fracture in other disease, right foot, initial encounter for fracture: Secondary | ICD-10-CM | POA: Diagnosis not present

## 2023-09-28 DIAGNOSIS — M89571 Osteolysis, right ankle and foot: Secondary | ICD-10-CM | POA: Diagnosis not present

## 2023-09-28 DIAGNOSIS — M21271 Flexion deformity, right ankle and toes: Secondary | ICD-10-CM | POA: Diagnosis not present

## 2023-09-29 ENCOUNTER — Other Ambulatory Visit: Payer: Self-pay | Admitting: Family Medicine

## 2023-09-29 DIAGNOSIS — F5101 Primary insomnia: Secondary | ICD-10-CM

## 2023-09-29 MED ORDER — ZOLPIDEM TARTRATE 5 MG PO TABS: 5.0000 mg | ORAL_TABLET | Freq: Every evening | ORAL | 0 refills | Status: DC | PRN

## 2023-10-11 ENCOUNTER — Ambulatory Visit: Payer: Medicare (Managed Care) | Admitting: Podiatry

## 2023-10-18 DIAGNOSIS — M14671 Charcot's joint, right ankle and foot: Secondary | ICD-10-CM | POA: Diagnosis not present

## 2023-10-21 ENCOUNTER — Ambulatory Visit: Payer: Medicare (Managed Care)

## 2023-10-21 NOTE — Progress Notes (Signed)
 Patient was fit today fit was satisfactory helping to stabilize right foot ankle and charcot  Patient is very upset stating she went to another Dr. Veronica Gordon is brittle and Charcot is not stable and that she should have been in this boot a long time ago  I casted her on 3/7 I ordered on 3/31 as I had many orders ahead of hers as previous charting stated she could discontinue her walking boot and use it for flare ups PRN.  Manufacture started order on 4/2 I received it on 4/29- 3 days ago. I explained also that manufactures can get backed up as well. I apologized and told her I feel awful she feels this way and is so upset. Patient was given wear care and break in instructions and will call for adj if needed   Britton Cane Cped, CFo, CFm

## 2023-10-25 ENCOUNTER — Other Ambulatory Visit: Payer: Self-pay | Admitting: Family Medicine

## 2023-10-26 ENCOUNTER — Ambulatory Visit: Payer: Medicare (Managed Care)

## 2023-10-27 ENCOUNTER — Ambulatory Visit
Admission: RE | Admit: 2023-10-27 | Discharge: 2023-10-27 | Disposition: A | Discharge status: Home / Self Care | Payer: Medicare (Managed Care) | Source: Ambulatory Visit | Attending: Family Medicine | Admitting: Family Medicine

## 2023-10-27 DIAGNOSIS — Z1231 Encounter for screening mammogram for malignant neoplasm of breast: Secondary | ICD-10-CM | POA: Insufficient documentation

## 2023-10-27 NOTE — Progress Notes (Signed)
 Calf area of Right crow boot too tight we heated flared the calf and took one layer of plastizote off boot fits better we lined it with felt as it was sticky feeling after removing the one layer of plastizote I will take new zote and glue to [facility] on 5/30 patient is coming in for me to add when there  I am out of glue and also remaining layer will have time to compress by appt date  Britton Cane

## 2023-10-31 ENCOUNTER — Other Ambulatory Visit: Payer: Self-pay | Admitting: Family Medicine

## 2023-10-31 ENCOUNTER — Encounter: Payer: Self-pay | Admitting: Family Medicine

## 2023-10-31 DIAGNOSIS — F5101 Primary insomnia: Secondary | ICD-10-CM

## 2023-10-31 MED ORDER — ZOLPIDEM TARTRATE 5 MG PO TABS: 5.0000 mg | ORAL_TABLET | Freq: Every evening | ORAL | 0 refills | Status: DC | PRN

## 2023-10-31 NOTE — Telephone Encounter (Signed)
 Refills have been requested for the following medications:       zolpidem  (AMBIEN ) 5 MG tablet [[place] Simmons-Robinson]   Preferred pharmacy: [id number] - [place], [place] - [address] Delivery method: Pickup

## 2023-11-18 ENCOUNTER — Ambulatory Visit: Payer: Medicare (Managed Care)

## 2023-11-22 ENCOUNTER — Other Ambulatory Visit: Payer: Self-pay | Admitting: Physician Assistant

## 2023-11-22 DIAGNOSIS — E039 Hypothyroidism, unspecified: Secondary | ICD-10-CM

## 2023-11-22 NOTE — Telephone Encounter (Signed)
 Requested Prescriptions  Pending Prescriptions Disp Refills   levothyroxine  (SYNTHROID ) 75 MCG tablet [Pharmacy Med Name: LEVOTHYROXINE  0.075MG  ( ) TABS] 90 tablet 0    Sig: TAKE 1 TABLET BY MOUTH DAILY     Endocrinology:  Hypothyroid Agents Passed - 11/22/2023  4:35 PM      Passed - TSH in normal range and within 360 days    TSH  Date Value Ref Range Status  01/06/2023 0.984 0.450 - 4.500 uIU/mL Final         Passed - Valid encounter within last 12 months    Recent Outpatient Visits           3 months ago Flu-like symptoms   [facility] Lamon Pillow, MD       Future Appointments             In 6 days Harris Liming, MD [facility]

## 2023-11-25 ENCOUNTER — Other Ambulatory Visit: Payer: Self-pay | Admitting: Family Medicine

## 2023-11-28 ENCOUNTER — Encounter: Payer: Self-pay | Admitting: Dermatology

## 2023-11-28 ENCOUNTER — Ambulatory Visit: Payer: Medicare (Managed Care) | Admitting: Dermatology

## 2023-11-28 DIAGNOSIS — D1801 Hemangioma of skin and subcutaneous tissue: Secondary | ICD-10-CM

## 2023-11-28 DIAGNOSIS — Z85828 Personal history of other malignant neoplasm of skin: Secondary | ICD-10-CM | POA: Diagnosis not present

## 2023-11-28 DIAGNOSIS — L821 Other seborrheic keratosis: Secondary | ICD-10-CM | POA: Diagnosis not present

## 2023-11-28 DIAGNOSIS — L570 Actinic keratosis: Secondary | ICD-10-CM | POA: Diagnosis not present

## 2023-11-28 DIAGNOSIS — Z1283 Encounter for screening for malignant neoplasm of skin: Secondary | ICD-10-CM

## 2023-11-28 DIAGNOSIS — L578 Other skin changes due to chronic exposure to nonionizing radiation: Secondary | ICD-10-CM

## 2023-11-28 DIAGNOSIS — Z86018 Personal history of other benign neoplasm: Secondary | ICD-10-CM | POA: Diagnosis not present

## 2023-11-28 DIAGNOSIS — D492 Neoplasm of unspecified behavior of bone, soft tissue, and skin: Secondary | ICD-10-CM | POA: Diagnosis not present

## 2023-11-28 DIAGNOSIS — W908XXA Exposure to other nonionizing radiation, initial encounter: Secondary | ICD-10-CM

## 2023-11-28 DIAGNOSIS — L814 Other melanin hyperpigmentation: Secondary | ICD-10-CM

## 2023-11-28 DIAGNOSIS — Z85820 Personal history of malignant melanoma of skin: Secondary | ICD-10-CM

## 2023-11-28 NOTE — Patient Instructions (Addendum)

## 2023-11-28 NOTE — Progress Notes (Signed)
 Follow-Up Visit   Subjective  [name] is a 73 y.o. female who presents for the following: Skin Cancer Screening and Full Body Skin Exam. Patient with hx of SCC, BCC, dysplastic nevus, and melanoma.   The patient presents for Total-Body Skin Exam (TBSE) for skin cancer screening and mole check. The patient has spots, moles and lesions to be evaluated, some may be new or changing and the patient may have concern these could be cancer.   The following portions of the chart were reviewed this encounter and updated as appropriate: medications, allergies, medical history  Review of Systems:  No other skin or systemic complaints except as noted in HPI or Assessment and Plan.  Objective  Well appearing patient in no apparent distress; mood and affect are within normal limits.  A full examination was performed including scalp, head, eyes, ears, nose, lips, neck, chest, axillae, abdomen, back, buttocks, bilateral upper extremities, bilateral lower extremities, hands, feet, fingers, toes, fingernails, and toenails. All findings within normal limits unless otherwise noted below.   Relevant physical exam findings are noted in the Assessment and Plan.  Scalp at hair part x1 Pink scaly macules Right lower leg Pink scaly papule  Assessment & Plan   SKIN CANCER SCREENING PERFORMED TODAY.  ACTINIC DAMAGE - Chronic condition, secondary to cumulative UV/sun exposure - diffuse scaly erythematous macules with underlying dyspigmentation - Recommend daily broad spectrum sunscreen SPF 30+ to sun-exposed areas, reapply every 2 hours as needed.  - Staying in the shade or wearing long sleeves, sun glasses (UVA+UVB protection) and wide brim hats (4-inch brim around the entire circumference of the hat) are also recommended for sun protection.  - Call for new or changing lesions.  LENTIGINES, SEBORRHEIC KERATOSES, HEMANGIOMAS - Benign normal skin lesions - Benign-appearing - Call for any  changes  MELANOCYTIC NEVI - Tan-brown and/or pink-flesh-colored symmetric macules and papules - Benign appearing on exam today - Observation - Call clinic for new or changing moles - Recommend daily use of broad spectrum spf 30+ sunscreen to sun-exposed areas.   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  - Call if any new or changing lesions are noted between office visits  HISTORY OF MELANOMA Chest- 2016 - No evidence of recurrence today - No lymphadenopathy - Recommend regular full body skin exams - Recommend daily broad spectrum sunscreen SPF 30+ to sun-exposed areas, reapply every 2 hours as needed.  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  Call if any new or changing lesions are noted between office visits SCAR Exam: Dyspigmented smooth macule or patch. Benign-appearing.  Observation.  Call clinic for new or changing lesions. Recommend daily broad spectrum sunscreen SPF 30+, reapply every  2 hours as needed. Treatment: Recommend Serica moisturizing scar formula cream every night or [facility] brand or Mederma silicone scar sheet every night for the first  year after a scar appears to help with scar remodeling if desired. Scars remodel on their own for a full year and will gradually improve in appearance over time.   AK (ACTINIC KERATOSIS) Scalp at hair part x1 Actinic keratoses are precancerous spots that appear secondary to cumulative UV radiation exposure/sun exposure over time. They are chronic with expected duration over 1 year. A portion of actinic keratoses will progress to squamous cell carcinoma of the skin. It is not possible to reliably predict which spots will progress to skin cancer and so treatment is recommended to prevent development of skin cancer.  Recommend daily broad spectrum sunscreen SPF 30+ to sun-exposed areas, reapply every 2 hours as needed.  Recommend staying in the shade or wearing long sleeves, sun glasses (UVA+UVB protection) and wide brim hats (4-inch brim around the entire circumference of the hat). Call for new or changing lesions. Destruction of lesion - Scalp at hair part x1 Complexity: simple   Destruction method: cryotherapy   Informed consent: discussed and consent obtained   Timeout:  patient name, date of birth, surgical site, and procedure verified Lesion destroyed using liquid nitrogen: Yes   Region frozen until ice ball extended beyond lesion: Yes   Cryo cycles: 1 or 2. Outcome: patient tolerated procedure well with no complications   Post-procedure details: wound care instructions given   NEOPLASM OF SKIN Right lower leg Would recommend biopsy to rule out [facility], patient would prefer to wait since she does have an upcoming surgery for her right foot and wants to prevent any possible risk of infection.  Return in 1 year (on 11/27/2024) for w/ Dr. Annette Barters, HxSCC, HxBCC, HxDysplastic Nevi, TBSE, recheck place at R lower leg.  I, Jacquelynn V. Grier Leber, CMA, am acting as scribe for Harris Liming, MD .   Documentation: I have reviewed the above documentation for accuracy and completeness, and I agree  with the above.  Harris Liming, MD

## 2023-11-28 NOTE — Telephone Encounter (Signed)
 Requested medications are due for refill today.  yes  Requested medications are on the active medications list.  yes  Last refill. 10/28/2023 #30 0 rf  Future visit scheduled.   yes  Notes to clinic.  Abnormal labs are to be redone 2 weeks after 01/06/2023 - Labs were not done. Please advise.     Requested Prescriptions  Pending Prescriptions Disp Refills   potassium chloride  (KLOR-CON  M) 10 MEQ tablet [Pharmacy Med Name: POTASSIUM CL MICRO ER TABS] 30 tablet 0    Sig: TAKE 1 TABLET(10 MEQ) BY MOUTH DAILY     Endocrinology:  Minerals - Potassium Supplementation Failed - 11/28/2023 12:27 PM      Failed - K in normal range and within 360 days    Potassium  Date Value Ref Range Status  01/06/2023 3.4 (L) 3.5 - 5.2 mmol/L Final         Failed - Cr in normal range and within 360 days    Creatinine, Ser  Date Value Ref Range Status  01/06/2023 1.08 (H) 0.57 - 1.00 mg/dL Final         Passed - Valid encounter within last 12 months    Recent Outpatient Visits           3 months ago Flu-like symptoms    [facility] Lamon Pillow, MD       Future Appointments             Today Harris Liming, MD [facility]

## 2023-11-30 ENCOUNTER — Telehealth: Payer: Self-pay | Admitting: Podiatry

## 2023-11-30 NOTE — Telephone Encounter (Signed)
 Stephanie from [place]  [facility] called in regards to a crow brace for patient. The brace that was sent back to them is not the brace needed- states if she were to remake it it would be the exact thing we sent them in-and they are trying to locate it in our office. They are requesting us  to see if we can find it and if so send it back. Can find return mailing label on [place]  [facility]'s website under support. If you have any questions you can contact Stephanie at [phone].

## 2023-12-06 ENCOUNTER — Other Ambulatory Visit: Payer: Self-pay | Admitting: Family Medicine

## 2023-12-06 DIAGNOSIS — E039 Hypothyroidism, unspecified: Secondary | ICD-10-CM

## 2023-12-07 ENCOUNTER — Other Ambulatory Visit: Payer: Self-pay | Admitting: Family Medicine

## 2023-12-07 DIAGNOSIS — I10 Essential (primary) hypertension: Secondary | ICD-10-CM

## 2023-12-07 DIAGNOSIS — E7849 Other hyperlipidemia: Secondary | ICD-10-CM

## 2023-12-09 NOTE — Telephone Encounter (Signed)
 Requested Prescriptions  Pending Prescriptions Disp Refills   triamterene -hydrochlorothiazide (DYAZIDE) 37.5-25 MG capsule [Pharmacy Med Name: TRIAMTERENE  37.5MG / HCTZ 25MG  CAPS] 90 capsule 0    Sig: TAKE 1 CAPSULE BY MOUTH EVERY DAY     Cardiovascular: Diuretic Combos Failed - 12/09/2023 10:57 AM      Failed - K in normal range and within 180 days    Potassium  Date Value Ref Range Status  01/06/2023 3.4 (L) 3.5 - 5.2 mmol/L Final         Failed - Na in normal range and within 180 days    Sodium  Date Value Ref Range Status  01/06/2023 144 134 - 144 mmol/L Final         Failed - Cr in normal range and within 180 days    Creatinine, Ser  Date Value Ref Range Status  01/06/2023 1.08 (H) 0.57 - 1.00 mg/dL Final         Passed - Last BP in normal range    BP Readings from Last 1 Encounters:  08/01/23 111/66         Passed - Valid encounter within last 6 months    Recent Outpatient Visits           4 months ago Flu-like symptoms   [facility] Lamon Pillow, MD               allopurinol  (ZYLOPRIM ) 300 MG tablet [Pharmacy Med Name: ALLOPURINOL  300MG  TABLETS] 90 tablet 0    Sig: TAKE 1 TABLET BY MOUTH DAILY     Endocrinology:  Gout Agents - allopurinol  Failed - 12/09/2023 10:57 AM      Failed - Cr in normal range and within 360 days    Creatinine, Ser  Date Value Ref Range Status  01/06/2023 1.08 (H) 0.57 - 1.00 mg/dL Final         Failed - CBC within normal limits and completed in the last 12 months    WBC  Date Value Ref Range Status  01/06/2023 6.2 3.4 - 10.8 x10E3/uL Final   RBC  Date Value Ref Range Status  01/06/2023 4.28 3.77 - 5.28 x10E6/uL Final   Hemoglobin  Date Value Ref Range Status  01/06/2023 12.8 11.1 - 15.9 g/dL Final   Hematocrit  Date Value Ref Range Status  01/06/2023 38.6 34.0 - 46.6 % Final   MCHC  Date Value Ref Range Status  01/06/2023 33.2 31.5 - 35.7 g/dL Final   [facility]  Date Value Ref Range Status   01/06/2023 29.9 26.6 - 33.0 pg Final   MCV  Date Value Ref Range Status  01/06/2023 90 79 - 97 fL Final   No results found for: PLTCOUNTKUC, LABPLAT, POCPLA RDW  Date Value Ref Range Status  01/06/2023 12.8 11.7 - 15.4 % Final         Passed - Uric Acid in normal range and within 360 days    Uric Acid  Date Value Ref Range Status  01/06/2023 4.8 3.1 - 7.9 mg/dL Final    Comment:               Therapeutic target for gout patients: <6.0         Passed - Valid encounter within last 12 months    Recent Outpatient Visits           4 months ago Flu-like symptoms   [facility] Lamon Pillow, MD  simvastatin  (ZOCOR ) 40 MG tablet [Pharmacy Med Name: SIMVASTATIN  40MG  TABLETS] 90 tablet 0    Sig: TAKE 1 TABLET(40 MG) BY MOUTH DAILY     Cardiovascular:  Antilipid - Statins Failed - 12/09/2023 10:57 AM      Failed - Lipid Panel in normal range within the last 12 months    Cholesterol, Total  Date Value Ref Range Status  01/06/2023 168 100 - 199 mg/dL Final   LDL Chol Calc (NIH)  Date Value Ref Range Status  01/06/2023 85 0 - 99 mg/dL Final   HDL  Date Value Ref Range Status  01/06/2023 43 >39 mg/dL Final   Triglycerides  Date Value Ref Range Status  01/06/2023 242 (H) 0 - 149 mg/dL Final         Passed - Patient is not pregnant      Passed - Valid encounter within last 12 months    Recent Outpatient Visits           4 months ago Flu-like symptoms   [facility] Lamon Pillow, MD

## 2023-12-13 ENCOUNTER — Ambulatory Visit (INDEPENDENT_AMBULATORY_CARE_PROVIDER_SITE_OTHER): Payer: Medicare (Managed Care)

## 2023-12-13 VITALS — BP 120/72 | Ht 64.0 in | Wt 161.0 lb

## 2023-12-13 DIAGNOSIS — E039 Hypothyroidism, unspecified: Secondary | ICD-10-CM

## 2023-12-13 DIAGNOSIS — Z0001 Encounter for general adult medical examination with abnormal findings: Secondary | ICD-10-CM

## 2023-12-13 DIAGNOSIS — Z0000 Encounter for general adult medical examination without abnormal findings: Secondary | ICD-10-CM

## 2023-12-13 NOTE — Patient Instructions (Addendum)
 Ms. [name] , Thank you for taking time out of your busy schedule to complete your Annual Wellness Visit with me. I enjoyed our conversation and look forward to speaking with you again next year. I, as well as your care team,  appreciate your ongoing commitment to your health goals. Please review the following plan we discussed and let me know if I can assist you in the future.   Follow up Visits: Next Medicare AWV with our clinical staff:   12/18/24 @ 1:10 PM IN PERSON Have you seen your provider in the last 6 months (3 months if uncontrolled diabetes)? Yes   Clinician Recommendations:  Aim for 30 minutes of exercise or brisk walking, 6-8 glasses of water, and 5 servings of fruits and vegetables each day. TAKE CARE!      This is a list of the screening recommended for you and due dates:  Health Maintenance  Topic Date Due   Zoster (Shingles) Vaccine (1 of 2) Never done   COVID-19 Vaccine (4 - Pfizer risk 2024-25 season) 08/22/2023   Flu Shot  01/20/2024   Mammogram  10/26/2024   Medicare Annual Wellness Visit  12/12/2024   DEXA scan (bone density measurement)  02/15/2028   DTaP/Tdap/Td vaccine (2 - Td or Tdap) 08/02/2028   Colon Cancer Screening  06/11/2029   Pneumococcal Vaccine for age over 89  Completed   Hepatitis C Screening  Completed   Hepatitis B Vaccine  Aged Out   HPV Vaccine  Aged Out   Meningitis B Vaccine  Aged Out    Advanced directives: (ACP Link)Information on Advanced Care Planning can be found at [place]  Print production planner Health Care Directives Advance Health Care Directives. [email or web address]  Advance Care Planning is important because it:  [x]  Makes sure you receive the medical care that is consistent with your values, goals, and preferences  [x]  It provides guidance to your family and loved ones and reduces their decisional burden about whether or not they are making the right decisions based on your wishes.  Follow the link provided in your  after visit summary or read over the paperwork we have mailed to you to help you started getting your Advance Directives in place. If you need assistance in completing these, please reach out to us  so that we can help you!

## 2023-12-13 NOTE — Progress Notes (Signed)
 Subjective:   Aryn Safran is a 73 y.o. who presents for a Medicare Wellness preventive visit.  As a reminder, Annual Wellness Visits don't include a physical exam, and some assessments may be limited, especially if this visit is performed virtually. We may recommend an in-person follow-up visit with your provider if needed.  Visit Complete: In person   Persons Participating in Visit: Patient.  AWV Questionnaire: No: Patient Medicare AWV questionnaire was not completed prior to this visit.  Cardiac Risk Factors include: advanced age (>30men, >65 women);dyslipidemia;sedentary lifestyle     Objective:    Today's Vitals   12/13/23 1142 12/13/23 1144  BP: 120/72   Weight: 161 lb (73 kg)   Height: 5' 4 (1.626 m)   PainSc:  5    Body mass index is 27.64 kg/m.     12/13/2023   12:02 PM 12/08/2022   10:14 AM  Advanced Directives  Does Patient Have a Medical Advance Directive? No Yes  Would patient like information on creating a medical advance directive? No - Patient declined     Current Medications (verified) Outpatient Encounter Medications as of 12/13/2023  Medication Sig   Ascorbic Acid (VITAMIN C) 500 MG CHEW Chew 2 tablets by mouth daily.   celecoxib (CELEBREX) 200 MG capsule Take 200 mg by mouth daily.   Cholecalciferol (VITAMIN D-1000 MAX ST) 25 MCG (1000 UT) tablet Take 1 tablet by mouth daily.   gabapentin  (NEURONTIN ) 100 MG capsule TAKE 1 CAPSULE(100 MG) BY MOUTH AT BEDTIME   hydroquinone  4 % cream Apply to dark spots twice a day until improved.   levothyroxine  (SYNTHROID ) 75 MCG tablet TAKE 1 TABLET BY MOUTH DAILY   Multiple Vitamins-Minerals (ZINC PO) Take by mouth.   Naproxen Sodium 220 MG CAPS Take 1 tablet by mouth at bedtime as needed. (Patient taking differently: Take 1 tablet by mouth at bedtime as needed. TAKES ONE EVERY OTHER DAY)   omeprazole  (PRILOSEC) 20 MG capsule Take 1 capsule (20 mg total) by mouth daily.   potassium chloride  (KLOR-CON  M)  10 MEQ tablet TAKE 1 TABLET(10 MEQ) BY MOUTH DAILY   simvastatin  (ZOCOR ) 40 MG tablet TAKE 1 TABLET(40 MG) BY MOUTH DAILY   triamterene -hydrochlorothiazide (DYAZIDE) 37.5-25 MG capsule TAKE 1 CAPSULE BY MOUTH EVERY DAY   zolpidem  (AMBIEN ) 5 MG tablet Take 1 tablet (5 mg total) by mouth at bedtime as needed for sleep.   allopurinol  (ZYLOPRIM ) 300 MG tablet TAKE 1 TABLET BY MOUTH DAILY (Patient not taking: Reported on 12/13/2023)   ciclopirox  (PENLAC ) 8 % solution Apply topically at bedtime. Apply over nail and surrounding skin. Apply daily over previous coat. After seven (7) days, may remove with alcohol and continue cycle.   colchicine  0.6 MG tablet Take 2 tablets (1.2mg ) by mouth on day one, take an additional 1 tablet one hour after first two tablets.  Take 1 tablet by mouth daily for 6 additional days   tacrolimus  (PROTOPIC ) 0.1 % ointment Apply topically 2 (two) times daily. Bid to scar on left calf and rash around left eye until clear, then prn flares   terbinafine  (LAMISIL ) 250 MG tablet Take 1 tablet (250 mg total) by mouth daily.   [DISCONTINUED] allopurinol  (ZYLOPRIM ) 300 MG tablet TAKE 1 TABLET BY MOUTH DAILY   [DISCONTINUED] simvastatin  (ZOCOR ) 40 MG tablet TAKE 1 TABLET(40 MG) BY MOUTH DAILY   [DISCONTINUED] triamterene -hydrochlorothiazide (DYAZIDE) 37.5-25 MG capsule TAKE 1 CAPSULE BY MOUTH EVERY DAY   No facility-administered encounter medications on file as of 12/13/2023.  Allergies (verified) Patient has no known allergies.   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 Excised 05/27/2020, margins free.   Past Surgical History:  Procedure Laterality Date   ABDOMINAL HYSTERECTOMY     BACK SURGERY     BREAST CYST ASPIRATION Left    Neg   COLONOSCOPY WITH PROPOFOL  N/A 06/12/2019   Procedure: COLONOSCOPY WITH PROPOFOL ;  Surgeon: Jinny Carmine, MD;  Location: [facility] ENDOSCOPY;  Service: Endoscopy;  Laterality: N/A;   EYE SURGERY     cataracts   FOOT SURGERY     FRACTURE SURGERY  1970   Broke nose   MELANOMA EXCISION     SPINE SURGERY  2008   Family History  Problem Relation Age of Onset   Breast cancer Mother 38   Arthritis Mother    Early death Mother    Hearing loss Father    Cancer Sister    Social History   Socioeconomic History   Marital status: Married    Spouse name: Not on file   Number of children: Not on file   Years of education: Not on file   Highest education level: Some college, no degree  Occupational History   Not on file  Tobacco Use   Smoking status: Never   Smokeless tobacco: Never  Vaping Use   Vaping status: Never Used  Substance and Sexual Activity   Alcohol use: Yes    Comment: occasional   Drug use: Never   Sexual activity: Not Currently  Other Topics Concern   Not on file  Social History Narrative   Not on file   Social Drivers of Health   Financial Resource Strain: Low Risk  (12/13/2023)   Overall Financial Resource Strain (CARDIA)    Difficulty of Paying Living Expenses: Not hard at all  Food Insecurity: No Food Insecurity (12/13/2023)   Hunger Vital Sign    Worried About Running Out of Food in the Last Year: Never true    Ran Out of Food in the Last Year: Never true  Recent Concern: Food Insecurity - Food Insecurity Present (12/09/2023)   Hunger Vital Sign    Worried About Running Out of Food in the Last Year: Never true    Ran Out of Food in the Last Year: Sometimes true  Transportation Needs: No Transportation Needs (12/13/2023)   PRAPARE - Therapist, art (Medical): No    Lack of Transportation (Non-Medical): No  Physical Activity: Inactive (12/13/2023)   Exercise Vital Sign    Days of Exercise per Week: 0 days    Minutes of Exercise per Session: 0 min  Stress: Stress Concern Present (12/13/2023)   [employer] of Occupational Health - Occupational Stress Questionnaire    Feeling of Stress: To some extent  Social Connections: Socially Integrated (12/13/2023)   Social Connection and Isolation Panel    Frequency of Communication with Friends and Family: More than three times a week  Frequency of Social Gatherings with Friends and Family: Once a week    Attends Religious Services: More than 4 times per year    Active Member of [employer] or Organizations: Yes    Attends Banker Meetings: 1 to 4 times per year    Marital Status: Married    Tobacco Counseling Counseling given: Not Answered    Clinical Intake:  Pre-visit preparation completed: Yes  Pain : 0-10 Pain Score: 5  Pain Type: Chronic pain Pain Location: Foot Pain Orientation: Right Pain Radiating Towards: been a boot since November- having surgery in January Pain Descriptors / Indicators: Aching, Discomfort, Constant Pain Onset: More than a month ago Pain Frequency: Constant Pain Relieving Factors: laying down  Pain Relieving Factors: laying down  BMI - recorded: 27.64 Nutritional Status: BMI 25 -29 Overweight Nutritional Risks: None Diabetes: No  Lab Results  Component Value Date   HGBA1C 5.1 10/05/2022     How often do you need to have someone help you when you read instructions, pamphlets, or other written materials from your doctor or pharmacy?: 1 - Never  Interpreter Needed?: No  Information entered by :: JHONNIE DAS, LPN   Activities of Daily Living     12/13/2023   12:04 PM 12/09/2023    9:14 AM  In your present state of health, do you have any difficulty performing the following activities:  Hearing? 0 0   Vision? 0 0  Difficulty concentrating or making decisions? 0 0  Walking or climbing stairs? 1 1  Dressing or bathing? 0 0  Doing errands, shopping? 0 0  Preparing Food and eating ? N N  Using the Toilet? N N  In the past six months, have you accidently leaked urine? Y Y  Do you have problems with loss of bowel control? Y Y  Managing your Medications? N N  Managing your Finances? N N  Housekeeping or managing your Housekeeping? CINDERELLA CINDERELLA    Patient Care Team: Sharma Coyer, MD as PCP - General (Family Medicine) [facility], [facility] (Optometry)  I have updated your Care Teams any recent Medical Services you may have received from other providers in the past year.     Assessment:   This is a routine wellness examination for Missey.  Hearing/Vision screen Hearing Screening - Comments:: NO AIDS Vision Screening - Comments:: WEARS GLASSES ALL DAY- [facility]   Goals Addressed             This Visit's Progress    DIET - EAT MORE FRUITS AND VEGETABLES         Depression Screen     12/13/2023   11:59 AM 08/01/2023    2:07 PM 01/06/2023    8:45 AM 12/15/2022    3:21 PM 12/08/2022   10:11 AM 10/05/2022    3:00 PM 02/10/2022   10:59 AM  PHQ 2/9 Scores  PHQ - 2 Score 1 3 0 2 2 2  0  PHQ- 9 Score 2 6  4 4 4 3     Fall Risk     12/09/2023    9:14 AM 01/06/2023    8:45 AM 12/15/2022    3:15 PM 12/08/2022   10:09 AM 10/05/2022    3:00 PM  Fall Risk   Falls in the past year? 1 0 0 0 0  Number falls in past yr: 0  0 0 0  Injury with Fall? 0 0 0 0 0  Risk for fall due to : Impaired mobility;Orthopedic  patient No Fall Risks  No Fall Risks No Fall Risks  Follow up Falls evaluation completed;Falls prevention discussed Falls evaluation completed  Falls prevention discussed;Education provided     MEDICARE RISK AT HOME:  Medicare Risk at Home Any stairs in or around the home?: No If so, are there any without handrails?: No Home free of loose throw rugs in walkways, pet  beds, electrical cords, etc?: No Adequate lighting in your home to reduce risk of falls?: Yes Life alert?: No Use of a cane, walker or w/c?: No Grab bars in the bathroom?: No Shower chair or bench in shower?: Yes Elevated toilet seat or a handicapped toilet?: Yes  TIMED UP AND GO:  Was the test performed?  Yes  Length of time to ambulate 10 feet: 5 sec Gait slow and steady without use of assistive device  Cognitive Function: 6CIT completed        12/13/2023   12:07 PM 12/08/2022   10:20 AM  6CIT Screen  What Year? 0 points 0 points  What month? 0 points 0 points  What time? 0 points 0 points  Count back from 20 0 points 0 points  Months in reverse 0 points 0 points  Repeat phrase 0 points 0 points  Total Score 0 points 0 points    Immunizations Immunization History  Administered Date(s) Administered   Fluad Quad(high Dose 65+) 03/10/2020   Influenza, High Dose Seasonal PF 07/18/2017, 08/02/2018, 03/06/2019   Influenza-Unspecified 04/24/2021   PFIZER(Purple Top)SARS-COV-2 Vaccination 07/15/2019, 08/06/2019   Pfizer(Comirnaty)Fall Seasonal Vaccine 12 years and older 02/22/2023   Pneumococcal Conjugate-13 07/15/2016   Pneumococcal Polysaccharide-23 07/18/2017   Tdap 08/02/2018    Screening Tests Health Maintenance  Topic Date Due   Zoster Vaccines- Shingrix (1 of 2) Never done   COVID-19 Vaccine (4 - Pfizer risk 2024-25 season) 08/22/2023   INFLUENZA VACCINE  01/20/2024   MAMMOGRAM  10/26/2024   Medicare Annual Wellness (AWV)  12/12/2024   DEXA SCAN  02/15/2028   DTaP/Tdap/Td (2 - Td or Tdap) 08/02/2028   Colonoscopy  06/11/2029   Pneumococcal Vaccine: 50+ Years  Completed   Hepatitis C Screening  Completed   Hepatitis B Vaccines  Aged Out   HPV VACCINES  Aged Out   Meningococcal B Vaccine  Aged Out    Health Maintenance  Health Maintenance Due  Topic Date Due   Zoster Vaccines- Shingrix (1 of 2) Never done   COVID-19 Vaccine (4 - Pfizer risk 2024-25  season) 08/22/2023   Health Maintenance Items Addressed: UP TO DATE ON MAMMOGRAM & COLONOSCOPY & BDS; UP TO DATE ON TDAP; WANTS NO MORE COVID SHOTS; DECLINES PNA  Additional Screening:  Vision Screening: Recommended annual ophthalmology exams for early detection of glaucoma and other disorders of the eye. Would you like a referral to an eye doctor? No    Dental Screening: Recommended annual dental exams for proper oral hygiene  Community Resource Referral / Chronic Care Management: CRR required this visit?  No   CCM required this visit?  No   Plan:    I have personally reviewed and noted the following in the patient's chart:   Medical and social history Use of alcohol, tobacco or illicit drugs  Current medications and supplements including opioid prescriptions. Patient is not currently taking opioid prescriptions. Functional ability and status Nutritional status Physical activity Advanced directives List of other physicians Hospitalizations, surgeries, and ER visits in previous 12 months Vitals Screenings to include cognitive, depression, and falls Referrals and  appointments  In addition, I have reviewed and discussed with patient certain preventive protocols, quality metrics, and best practice recommendations. A written personalized care plan for preventive services as well as general preventive health recommendations were provided to patient.   Jhonnie GORMAN Das, LPN   [id number]   After Visit Summary: (In Person-Declined) Patient declined AVS at this time.  Notes: Nothing significant to report at this time.

## 2023-12-14 ENCOUNTER — Other Ambulatory Visit: Payer: Self-pay | Admitting: Family Medicine

## 2023-12-14 DIAGNOSIS — F5101 Primary insomnia: Secondary | ICD-10-CM

## 2023-12-15 ENCOUNTER — Other Ambulatory Visit: Payer: Self-pay | Admitting: Family Medicine

## 2023-12-15 DIAGNOSIS — E039 Hypothyroidism, unspecified: Secondary | ICD-10-CM

## 2023-12-15 MED ORDER — POTASSIUM CHLORIDE CRYS ER 10 MEQ PO TBCR: 10.0000 meq | EXTENDED_RELEASE_TABLET | Freq: Every day | ORAL | 0 refills | Status: DC

## 2023-12-15 MED ORDER — LEVOTHYROXINE SODIUM 75 MCG PO TABS
75.0000 ug | ORAL_TABLET | Freq: Every day | ORAL | 0 refills | Status: DC
Start: 2023-12-15 — End: 2024-02-22

## 2023-12-15 NOTE — Telephone Encounter (Signed)
 Pt requests call, expressed dissatisfaction with this clinic during AWV   Please advise patient that courtesy refills have been sent to her pharmacy for synthroid  and potassium however she has not had a visit with me since Oct 2024 and needs an office visit for updated labs and medication review for chronic conditions   Please assist pt with scheduling an OV

## 2023-12-22 ENCOUNTER — Telehealth: Payer: Self-pay

## 2023-12-22 NOTE — Telephone Encounter (Signed)
 Tracking number [id number] [employer] / Omelia boot tracking number

## 2023-12-29 ENCOUNTER — Telehealth: Payer: Self-pay

## 2023-12-29 NOTE — Telephone Encounter (Signed)
 LM for patient her Omelia boot is on shelf in Lab however I am leaving early for Dr. Tomasa today and asked that if she is coming to please let us  know and I will leave it here otherwise I am going to [facility] office tomorrow and can take with me today when I leave with my other [employer] items. I asked her to call by 2-2:30   Lolita Schultze Cped, CFo, CFm

## 2024-01-09 ENCOUNTER — Other Ambulatory Visit: Payer: Self-pay | Admitting: Family Medicine

## 2024-01-09 ENCOUNTER — Ambulatory Visit: Payer: Medicare (Managed Care) | Admitting: Dermatology

## 2024-01-09 NOTE — Progress Notes (Signed)
 Sending back to [place]  to make larger in calf area

## 2024-01-10 NOTE — Telephone Encounter (Signed)
 Called and spoke to the pt, her appt has been moved to 9/3 per DR R she needed to be seen sooner for further refills

## 2024-01-17 DIAGNOSIS — M14671 Charcot's joint, right ankle and foot: Secondary | ICD-10-CM | POA: Diagnosis not present

## 2024-01-22 ENCOUNTER — Other Ambulatory Visit: Payer: Self-pay | Admitting: Family Medicine

## 2024-01-22 DIAGNOSIS — F5101 Primary insomnia: Secondary | ICD-10-CM

## 2024-02-03 DIAGNOSIS — H26493 Other secondary cataract, bilateral: Secondary | ICD-10-CM | POA: Diagnosis not present

## 2024-02-03 DIAGNOSIS — H04123 Dry eye syndrome of bilateral lacrimal glands: Secondary | ICD-10-CM | POA: Diagnosis not present

## 2024-02-03 DIAGNOSIS — D3131 Benign neoplasm of right choroid: Secondary | ICD-10-CM | POA: Diagnosis not present

## 2024-02-03 DIAGNOSIS — Z961 Presence of intraocular lens: Secondary | ICD-10-CM | POA: Diagnosis not present

## 2024-02-03 DIAGNOSIS — H11001 Unspecified pterygium of right eye: Secondary | ICD-10-CM | POA: Diagnosis not present

## 2024-02-22 ENCOUNTER — Encounter: Payer: Self-pay | Admitting: Family Medicine

## 2024-02-22 ENCOUNTER — Ambulatory Visit (INDEPENDENT_AMBULATORY_CARE_PROVIDER_SITE_OTHER): Payer: Medicare (Managed Care) | Admitting: Family Medicine

## 2024-02-22 VITALS — BP 121/73 | HR 70 | Wt 161.0 lb

## 2024-02-22 DIAGNOSIS — M19042 Primary osteoarthritis, left hand: Secondary | ICD-10-CM | POA: Diagnosis not present

## 2024-02-22 DIAGNOSIS — G629 Polyneuropathy, unspecified: Secondary | ICD-10-CM | POA: Diagnosis not present

## 2024-02-22 DIAGNOSIS — K219 Gastro-esophageal reflux disease without esophagitis: Secondary | ICD-10-CM

## 2024-02-22 DIAGNOSIS — R195 Other fecal abnormalities: Secondary | ICD-10-CM

## 2024-02-22 DIAGNOSIS — M14671 Charcot's joint, right ankle and foot: Secondary | ICD-10-CM

## 2024-02-22 DIAGNOSIS — F320 Major depressive disorder, single episode, mild: Secondary | ICD-10-CM

## 2024-02-22 DIAGNOSIS — F5101 Primary insomnia: Secondary | ICD-10-CM | POA: Diagnosis not present

## 2024-02-22 DIAGNOSIS — F411 Generalized anxiety disorder: Secondary | ICD-10-CM | POA: Diagnosis not present

## 2024-02-22 DIAGNOSIS — E7849 Other hyperlipidemia: Secondary | ICD-10-CM | POA: Diagnosis not present

## 2024-02-22 DIAGNOSIS — R600 Localized edema: Secondary | ICD-10-CM | POA: Diagnosis not present

## 2024-02-22 DIAGNOSIS — E039 Hypothyroidism, unspecified: Secondary | ICD-10-CM

## 2024-02-22 DIAGNOSIS — F3289 Other specified depressive episodes: Secondary | ICD-10-CM

## 2024-02-22 DIAGNOSIS — F324 Major depressive disorder, single episode, in partial remission: Secondary | ICD-10-CM

## 2024-02-22 MED ORDER — LEVOTHYROXINE SODIUM 75 MCG PO TABS
75.0000 ug | ORAL_TABLET | Freq: Every day | ORAL | 2 refills | Status: AC
Start: 2024-02-22 — End: ?

## 2024-02-22 MED ORDER — TRIAMTERENE-HCTZ 37.5-25 MG PO CAPS: ORAL_CAPSULE | ORAL | Status: DC

## 2024-02-22 MED ORDER — SIMVASTATIN 40 MG PO TABS
ORAL_TABLET | ORAL | Status: DC
Start: 2024-02-22 — End: 2024-03-06

## 2024-02-22 MED ORDER — ZOLPIDEM TARTRATE 5 MG PO TABS: 2.5000 mg | ORAL_TABLET | Freq: Every evening | ORAL | Status: AC | PRN

## 2024-02-22 NOTE — Patient Instructions (Addendum)
 Please discuss with your pharmacist COVID vaccine & Shingrix vaccines   Influenza vaccine can be here if you would like

## 2024-02-22 NOTE — Progress Notes (Addendum)
 Established patient visit   Patient: [name]   DOB: 1951-04-05   73 y.o. Female  MRN: [id number] Visit Date: 02/22/2024  Today's healthcare provider: Rockie Agent, MD   Chief Complaint  Patient presents with   Medical Management of Chronic Issues   Subjective       Discussed the use of AI scribe software for clinical note transcription with the patient, who gave verbal consent to proceed.  History of Present Illness [name] is a 73 year old female who presents for a chronic care follow-up.  She has ongoing issues with her foot, where the bones are protruding through the bottom, necessitating the use of a crow walker boot. This has led to ulcers on the bottom of her foot due to circulation issues caused by the boot. She is scheduled to see a surgeon on the 16th and anticipates a lengthy recovery process post-surgery, including non-weight bearing for 4-6 months followed by physical therapy.  She has a history of anxiety and major depression, which have been exacerbated by recent stressors, including an unsold house and her husband's health issues. She feels better now that the house is no longer on the market, although she still experiences mood fluctuations. She is not currently on any medication for anxiety or depression and prefers to manage these without antidepressants.  She experiences chronic insomnia and is currently taking Ambien  5 mg as needed, though she often takes half a dose. This helps her sleep through the night, especially when her foot pain is present. No adverse effects from the medication.  She has chronic neuropathy and is taking gabapentin  100 mg at bedtime, which she believes helps with nerve pain. She experiences leg cramps and is concerned about the potential for similar issues in her left foot.  Her chronic hypothyroidism is managed with Synthroid  75 mcg daily.  She has GERD and takes omeprazole  20 mg daily,  which she finds essential due to her diet. She also takes metoprolol 20 mg for reflux.  She has a history of chronic gout, previously managed with allopurinol , which she has since discontinued. She uses Celebrex 200 mg as needed for osteoarthritis and Voltaren gel for arthritis pain.  She is on simvastatin  40 mg daily for hyperlipidemia, though she takes it four times a week. She has started taking fish oil and CoQ10 to help manage her cholesterol and joint health.  She takes triamterene -hydrochlorothiazide 37.5-25 mg four times a week for edema, which she believes helps with her potassium levels and triglycerides.  She experiences loose stools and diarrhea up to five times a day but has not used any medication for this issue.     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 Excised 05/27/2020, margins free.    Medications: Outpatient Medications Prior to Visit  Medication Sig   Ascorbic Acid (VITAMIN C) 500 MG CHEW Chew 2 tablets by mouth daily.   celecoxib (CELEBREX) 200 MG capsule  Take 200 mg by mouth daily.   Cholecalciferol (VITAMIN D-1000 MAX ST) 25 MCG (1000 UT) tablet Take 1 tablet by mouth daily.   diclofenac Sodium (VOLTAREN ARTHRITIS PAIN) 1 % GEL Apply topically.   gabapentin  (NEURONTIN ) 100 MG capsule TAKE 1 CAPSULE(100 MG) BY MOUTH AT BEDTIME   gentamicin  ointment (GARAMYCIN ) 0.1 % Apply 1 Application topically as needed.   Gluc-Chonn-MSM-Boswellia-Vit D (GLUCOSAMINE CHOND TRIPLE/VIT D) TABS Take by mouth.   Multiple  Vitamins-Minerals (ZINC PO) Take by mouth.   Naproxen Sodium 220 MG CAPS Take 1 tablet by mouth at bedtime as needed. (Patient taking differently: Take 1 tablet by mouth at bedtime as needed. TAKES ONE EVERY OTHER DAY)   Omega-3 Fatty Acids (OMEGA 3 PO) Take by mouth.   omeprazole  (PRILOSEC) 20 MG capsule Take 1 capsule (20 mg total) by mouth daily.   potassium chloride  (KLOR-CON  M) 10 MEQ tablet Take 1 tablet (10 mEq total) by mouth daily.   triamcinolone  cream (KENALOG ) 0.1 % Apply 1 Application topically as needed.   [DISCONTINUED] allopurinol  (ZYLOPRIM ) 300 MG tablet TAKE 1 TABLET BY MOUTH DAILY   [DISCONTINUED] levothyroxine  (SYNTHROID ) 75 MCG tablet Take 1 tablet (75 mcg total) by mouth daily.   [DISCONTINUED] simvastatin  (ZOCOR ) 40 MG tablet TAKE 1 TABLET(40 MG) BY MOUTH DAILY   [DISCONTINUED] triamterene -hydrochlorothiazide (DYAZIDE) 37.5-25 MG capsule TAKE 1 CAPSULE BY MOUTH EVERY DAY   [DISCONTINUED] zolpidem  (AMBIEN ) 5 MG tablet TAKE 1 TABLET(5 MG) BY MOUTH AT BEDTIME AS NEEDED FOR SLEEP   [DISCONTINUED] hydroquinone  4 % cream Apply to dark spots twice a day until improved.   No facility-administered medications prior to visit.    Review of Systems  Last CBC Lab Results  Component Value Date   WBC 6.2 01/06/2023   HGB 12.8 01/06/2023   HCT 38.6 01/06/2023   MCV 90 01/06/2023   MCH 29.9 01/06/2023   RDW 12.8 01/06/2023   PLT 223 01/06/2023   Last metabolic panel Lab Results  Component Value Date   GLUCOSE 87 01/06/2023   NA 144 01/06/2023   K 3.4 (L) 01/06/2023   CL 103 01/06/2023   CO2 24 01/06/2023   BUN 24 01/06/2023   CREATININE 1.08 (H) 01/06/2023   EGFR 55 (L) 01/06/2023   CALCIUM 10.3 01/06/2023   PROT 6.5 01/06/2023   ALBUMIN 4.4 01/06/2023   LABGLOB 2.1 01/06/2023   AGRATIO 1.8 10/05/2022   BILITOT 0.5 01/06/2023   ALKPHOS 98 01/06/2023   AST 23 01/06/2023   ALT 20 01/06/2023   Last thyroid  functions Lab Results  Component Value Date   TSH 0.984  01/06/2023       No data to display          Flowsheet Row Clinical Support from 12/13/2023 in [facility]  PHQ-9 Total Score 2         Objective    BP 121/73 (BP Location: Right Arm, Patient Position: Sitting, Cuff Size: Normal)   Pulse 70   Wt 161 lb (73 kg) Comment: with a boot on  LMP  (LMP Unknown) Comment: 1980s  SpO2 98%   BMI 27.64 kg/m  BP Readings from Last 3 Encounters:  02/22/24 121/73  12/13/23 120/72  08/01/23 111/66   Wt Readings from Last 3 Encounters:  02/22/24 161 lb (73 kg)  12/13/23 161 lb (73 kg)  08/01/23 162 lb 14.4 oz (73.9 kg)        Physical Exam Vitals reviewed.  Constitutional:  General: She is not in acute distress.    Appearance: Normal appearance. She is not ill-appearing.  Cardiovascular:     Rate and Rhythm: Normal rate and regular rhythm.  Pulmonary:     Effort: Pulmonary effort is normal. No respiratory distress.     Breath sounds: No wheezing, rhonchi or rales.  Musculoskeletal:     Comments: Right foot in boot due to charcot foot   Neurological:     Mental Status: She is alert and oriented to person, place, and time.  Psychiatric:        Mood and Affect: Mood normal.        Behavior: Behavior normal.       No results found for any visits on 02/22/24.  Assessment & Plan     Problem List Items Addressed This Visit     Anxiety disorder, unspecified   Charcot joint of right foot   Gastroesophageal reflux disease without esophagitis   Hypothyroidism, unspecified - Primary   Relevant Medications   levothyroxine  (SYNTHROID ) 75 MCG tablet   Other Relevant Orders   TSH+T4F+T3Free   Localized edema   Relevant Medications   triamterene -hydrochlorothiazide (DYAZIDE) 37.5-25 MG capsule   Loose stools   Major depressive disorder, single episode, unspecified   Neuropathy   Other hyperlipidemia   Relevant Medications   triamterene -hydrochlorothiazide (DYAZIDE) 37.5-25 MG capsule    simvastatin  (ZOCOR ) 40 MG tablet   Other Relevant Orders   Comprehensive metabolic panel with GFR   Lipid panel   Primary insomnia   Relevant Medications   zolpidem  (AMBIEN ) 5 MG tablet   Primary osteoarthritis, left hand   Relevant Orders   Comprehensive metabolic panel with GFR     Assessment & Plan Charcot's joint, right foot and ankle Chronic condition with bone breakdown in the right foot and ankle. Scheduled for surgery on September 16th. Recovery expected to be 4-6 months non-weight bearing, followed by intense physical therapy and possibly casting for another 6 weeks. Total recovery time anticipated to be about a year. Experiences pain and difficulty standing for long periods. Concerns about bone strength and potential surgery complications discussed. - Continue current management until surgery - Follow up with surgeon on September 16th for surgery - Monitor bone strength through imaging  Polyneuropathy Chronic neuropathy managed with gabapentin  100 mg at bedtime. Experiences nerve pain and leg cramps, possibly related to potassium levels. Gabapentin  helps with nerve pain but higher doses cause dizziness. - Continue gabapentin  100 mg at bedtime - Check CMP to assess potassium levels and other electrolytes  Primary osteoarthritis, left hand Chronic osteoarthritis in the left hand. Uses Voltaren Arthritis Pain Gel and Celebrex 200 mg daily as needed for pain management. - Continue Voltaren Arthritis Pain Gel - Continue Celebrex 200 mg daily as needed  Generalized anxiety disorder Reports stress related to personal circumstances, including unsold house and husband's health issues. Prefers not to use antidepressants due to personal and family history. Engages in social activities and feels better after stressors are removed. - Encourage continued social engagement and stress management techniques  Depression Denies significant depressive symptoms and prefers not to use  antidepressants. Engages in social activities and reports improved mood after stressors are removed. - Encourage continued social engagement  Primary insomnia Chronic insomnia managed with Ambien  5 mg as needed. Currently takes 2.5 mg (half tablet) most nights, which helps achieve 6 hours of sleep. No adverse effects reported. - Continue Ambien  5 mg as needed, typically 2.5 mg nightly  Gastroesophageal reflux disease (  GERD) Chronic GERD managed with omeprazole  20 mg daily. Symptoms are controlled with current regimen. - Continue omeprazole  20 mg daily  Hypothyroidism Chronic hypothyroidism managed with Synthroid  75 mcg daily. Plan to check TSH and T4 levels to ensure appropriate dosing. - Continue Synthroid  75 mcg daily - Order TSH and T4 levels  Hyperlipidemia Chronic hyperlipidemia managed with simvastatin  40 mg daily. Takes simvastatin  four times a week and supplements with CoQ10. Recent addition of fish oil to regimen. - Continue simvastatin  40 mg four times a week - Continue CoQ10 supplementation - Continue fish oil supplementation - Check cholesterol levels  Edema Experiences edema, managed with triamterene -hydrochlorothiazide 37.5-25 mg daily, taken four times a week. Edema is related to water retention rather than hypertension. - Continue triamterene -hydrochlorothiazide 37.5-25 mg four times a week  Chronic diarrhea/loose stools Reports chronic loose stools, going 4-5 times a day. No current medication used for management. - Consider Imodium or Bentyl for management of loose stools  General Health Maintenance Recommended vaccinations include Shingrix, influenza, and COVID vaccines. Prefers to wait until October for the flu vaccine. - Recommend Shingrix vaccine - Recommend influenza vaccine in October - Recommend COVID vaccine     Return in about 6 months (around 08/21/2024) for Chronic F/U.         Rockie Agent, MD  [facility] [phone] (phone) [phone] (fax)  [facility]

## 2024-02-23 LAB — COMPREHENSIVE METABOLIC PANEL WITH GFR
ALT: 21 IU/L (ref 0–32)
AST: 23 IU/L (ref 0–40)
Albumin: 4.3 g/dL (ref 3.8–4.8)
Alkaline Phosphatase: 100 IU/L (ref 44–121)
BUN/Creatinine Ratio: 22 (ref 12–28)
BUN: 20 mg/dL (ref 8–27)
Bilirubin Total: 0.4 mg/dL (ref 0.0–1.2)
CO2: 22 mmol/L (ref 20–29)
Calcium: 10.2 mg/dL (ref 8.7–10.3)
Chloride: 107 mmol/L — ABNORMAL HIGH (ref 96–106)
Creatinine, Ser: 0.9 mg/dL (ref 0.57–1.00)
Globulin, Total: 2.5 g/dL (ref 1.5–4.5)
Glucose: 83 mg/dL (ref 70–99)
Potassium: 4 mmol/L (ref 3.5–5.2)
Sodium: 145 mmol/L — ABNORMAL HIGH (ref 134–144)
Total Protein: 6.8 g/dL (ref 6.0–8.5)
eGFR: 68 mL/min/1.73 (ref >59)

## 2024-02-23 LAB — LIPID PANEL
Chol/HDL Ratio: 3.7 ratio (ref 0.0–4.4)
Cholesterol, Total: 185 mg/dL (ref 100–199)
HDL: 50 mg/dL (ref >39)
LDL Chol Calc (NIH): 99 mg/dL (ref 0–99)
Triglycerides: 214 mg/dL — ABNORMAL HIGH (ref 0–149)
VLDL Cholesterol Cal: 36 mg/dL (ref 5–40)

## 2024-02-23 LAB — TSH+T4F+T3FREE
Free T4: 1.29 ng/dL (ref 0.82–1.77)
T3, Free: 2.4 pg/mL (ref 2.0–4.4)
TSH: 0.709 u[IU]/mL (ref 0.450–4.500)

## 2024-02-24 ENCOUNTER — Other Ambulatory Visit: Payer: Self-pay

## 2024-02-24 ENCOUNTER — Encounter: Payer: Self-pay | Admitting: Family Medicine

## 2024-02-24 NOTE — Telephone Encounter (Signed)
 LOV 02/22/24 NOV 08/22/24 LRF 12/15/23 30 x 0

## 2024-02-27 ENCOUNTER — Other Ambulatory Visit: Payer: Self-pay | Admitting: Family Medicine

## 2024-02-28 ENCOUNTER — Ambulatory Visit: Payer: Self-pay | Admitting: Family Medicine

## 2024-02-28 ENCOUNTER — Telehealth: Payer: Self-pay | Admitting: Family Medicine

## 2024-02-28 DIAGNOSIS — Z79899 Other long term (current) drug therapy: Secondary | ICD-10-CM

## 2024-02-28 MED ORDER — POTASSIUM CHLORIDE CRYS ER 10 MEQ PO TBCR
10.0000 meq | EXTENDED_RELEASE_TABLET | Freq: Every day | ORAL | 3 refills | Status: AC
Start: 2024-02-28 — End: ?

## 2024-02-28 NOTE — Telephone Encounter (Signed)
 Copied from CRM [id number]. Topic: Clinical - Prescription Issue >> Feb 28, 2024 10:24 AM Tiffini S wrote: Reason for CRM: Patient called stating that her medication refill was denied for  potassium chloride  (KLOR-CON  M) 10 MEQ tablet requested 02/28/2024-  She is out of the medication/ have taken the last bottle and need the medicine asap.    Patient is asking for a explanation about why the refill was not approved. Please call the patient at [phone].

## 2024-02-28 NOTE — Telephone Encounter (Signed)
 Patient's potassium levels were within normal range, 4.0 during last visit so initially denied refill   Refill has been sent to pharmacy since patient will continue hydrochlorothiazide for BP management

## 2024-02-29 NOTE — Telephone Encounter (Signed)
 Called and advised patient of your message, she informed me that potassium chloride  was refilled so she picked it up yesterday- states she will hold off on taking it until her levels are re-evaluated.

## 2024-03-04 ENCOUNTER — Other Ambulatory Visit: Payer: Self-pay | Admitting: Family Medicine

## 2024-03-06 ENCOUNTER — Other Ambulatory Visit: Payer: Self-pay | Admitting: Family Medicine

## 2024-03-06 ENCOUNTER — Other Ambulatory Visit: Payer: Self-pay | Admitting: Physician Assistant

## 2024-03-06 DIAGNOSIS — M14671 Charcot's joint, right ankle and foot: Secondary | ICD-10-CM | POA: Diagnosis not present

## 2024-03-06 DIAGNOSIS — G629 Polyneuropathy, unspecified: Secondary | ICD-10-CM

## 2024-03-06 DIAGNOSIS — R600 Localized edema: Secondary | ICD-10-CM

## 2024-03-06 DIAGNOSIS — E7849 Other hyperlipidemia: Secondary | ICD-10-CM

## 2024-03-23 ENCOUNTER — Ambulatory Visit: Payer: Medicare (Managed Care) | Admitting: Family Medicine

## 2024-03-23 ENCOUNTER — Ambulatory Visit (INDEPENDENT_AMBULATORY_CARE_PROVIDER_SITE_OTHER): Payer: Medicare (Managed Care)

## 2024-03-23 DIAGNOSIS — Z23 Encounter for immunization: Secondary | ICD-10-CM | POA: Diagnosis not present

## 2024-03-23 NOTE — Progress Notes (Signed)
 Patient is present to receive high dose flu vaccine today. Consent form given and signed. Immunization was given in Left deltoid, patient tolerated injection well.   No MD evaluation was required for this vaccine appt. Medical history, medications, surgical and social histories were reviewed.

## 2024-05-08 ENCOUNTER — Ambulatory Visit: Payer: Medicare (Managed Care) | Admitting: Podiatry

## 2024-05-08 DIAGNOSIS — M79674 Pain in right toe(s): Secondary | ICD-10-CM

## 2024-05-08 DIAGNOSIS — M79675 Pain in left toe(s): Secondary | ICD-10-CM

## 2024-05-08 DIAGNOSIS — B351 Tinea unguium: Secondary | ICD-10-CM

## 2024-05-08 NOTE — Progress Notes (Signed)
  Subjective:  Patient ID: [name], female    DOB: 1950-10-13,  MRN: [id number]  Chief Complaint  Patient presents with   Nail Problem   73 y.o. female returns for the above complaint.  Patient presents with thickened dystrophic mycotic toenails x 10 mild pain on palpation worse with ambulation is with pressure patient would like to have a debridement unable to do her self denies any other acute complaints  Objective:  There were no vitals filed for this visit. Podiatric Exam: Vascular: dorsalis pedis and posterior tibial pulses are palpable bilateral. Capillary return is immediate. Temperature gradient is WNL. Skin turgor WNL  Sensorium: Normal Semmes Weinstein monofilament test. Normal tactile sensation bilaterally. Nail Exam: Pt has thick disfigured discolored nails with subungual debris noted bilateral entire nail hallux through fifth toenails.  Pain on palpation to the nails. Ulcer Exam: There is no evidence of ulcer or pre-ulcerative changes or infection. Orthopedic Exam: Muscle tone and strength are WNL. No limitations in general ROM. No crepitus or effusions noted.  Skin: No Porokeratosis. No infection or ulcers    Assessment & Plan:   1. Pain due to onychomycosis of toenails of both feet     Patient was evaluated and treated and all questions answered.  Onychomycosis with pain  -Nails palliatively debrided as below. -Educated on self-care  Procedure: Nail Debridement Rationale: pain  Type of Debridement: manual, sharp debridement. Instrumentation: Nail nipper, rotary burr. Number of Nails: 10  Procedures and Treatment: Consent by patient was obtained for treatment procedures. The patient understood the discussion of treatment and procedures well. All questions were answered thoroughly reviewed. Debridement of mycotic and hypertrophic toenails, 1 through 5 bilateral and clearing of subungual debris. No ulceration, no infection noted.  Return Visit-Office  Procedure: Patient instructed to return to the office for a follow up visit 3 months for continued evaluation and treatment.  Franky Blanch, DPM    No follow-ups on file.

## 2024-05-18 ENCOUNTER — Telehealth: Payer: Self-pay

## 2024-05-25 DIAGNOSIS — M14671 Charcot's joint, right ankle and foot: Secondary | ICD-10-CM | POA: Diagnosis not present

## 2024-05-25 DIAGNOSIS — F329 Major depressive disorder, single episode, unspecified: Secondary | ICD-10-CM | POA: Diagnosis not present

## 2024-05-25 DIAGNOSIS — K219 Gastro-esophageal reflux disease without esophagitis: Secondary | ICD-10-CM | POA: Diagnosis not present

## 2024-05-25 DIAGNOSIS — E039 Hypothyroidism, unspecified: Secondary | ICD-10-CM | POA: Diagnosis not present

## 2024-05-25 DIAGNOSIS — M24571 Contracture, right ankle: Secondary | ICD-10-CM | POA: Diagnosis not present

## 2024-05-25 DIAGNOSIS — F419 Anxiety disorder, unspecified: Secondary | ICD-10-CM | POA: Diagnosis not present

## 2024-05-25 DIAGNOSIS — G629 Polyneuropathy, unspecified: Secondary | ICD-10-CM | POA: Diagnosis not present

## 2024-05-25 DIAGNOSIS — M19071 Primary osteoarthritis, right ankle and foot: Secondary | ICD-10-CM | POA: Diagnosis not present

## 2024-05-25 DIAGNOSIS — M109 Gout, unspecified: Secondary | ICD-10-CM | POA: Diagnosis not present

## 2024-05-25 DIAGNOSIS — R600 Localized edema: Secondary | ICD-10-CM | POA: Diagnosis not present

## 2024-05-25 DIAGNOSIS — G4700 Insomnia, unspecified: Secondary | ICD-10-CM | POA: Diagnosis not present

## 2024-05-28 NOTE — Telephone Encounter (Signed)
 Copied from CRM #[id number]. Topic: General - Other >> May 28, 2024 11:44 AM Joesph NOVAK wrote: Reason for CRM: Rhonda from [facility] is calling to confirm if patients PCP will follow for verbal orders approval. Patient had foot surgery and they will going out to patients home soon.  PH: [phone].

## 2024-05-28 NOTE — Telephone Encounter (Signed)
 Ok for verbal orders.    Rockie Agent, MD, [facility]  05/28/24 3:15 PM

## 2024-05-28 NOTE — Telephone Encounter (Signed)
 Have advised rhonda of patient's message

## 2024-06-05 DIAGNOSIS — M1A00X0 Idiopathic chronic gout, unspecified site, without tophus (tophi): Secondary | ICD-10-CM | POA: Diagnosis not present

## 2024-06-05 DIAGNOSIS — M1990 Unspecified osteoarthritis, unspecified site: Secondary | ICD-10-CM | POA: Diagnosis not present

## 2024-06-05 DIAGNOSIS — Z4789 Encounter for other orthopedic aftercare: Secondary | ICD-10-CM | POA: Diagnosis not present

## 2024-06-05 DIAGNOSIS — E785 Hyperlipidemia, unspecified: Secondary | ICD-10-CM | POA: Diagnosis not present

## 2024-06-05 DIAGNOSIS — F5101 Primary insomnia: Secondary | ICD-10-CM | POA: Diagnosis not present

## 2024-06-05 DIAGNOSIS — F419 Anxiety disorder, unspecified: Secondary | ICD-10-CM | POA: Diagnosis not present

## 2024-06-05 DIAGNOSIS — K219 Gastro-esophageal reflux disease without esophagitis: Secondary | ICD-10-CM | POA: Diagnosis not present

## 2024-06-05 DIAGNOSIS — F329 Major depressive disorder, single episode, unspecified: Secondary | ICD-10-CM | POA: Diagnosis not present

## 2024-06-05 DIAGNOSIS — E039 Hypothyroidism, unspecified: Secondary | ICD-10-CM | POA: Diagnosis not present

## 2024-06-08 DIAGNOSIS — M14671 Charcot's joint, right ankle and foot: Secondary | ICD-10-CM | POA: Diagnosis not present

## 2024-06-08 DIAGNOSIS — Z4789 Encounter for other orthopedic aftercare: Secondary | ICD-10-CM | POA: Diagnosis not present

## 2024-08-22 ENCOUNTER — Ambulatory Visit: Payer: Medicare (Managed Care) | Admitting: Family Medicine

## 2024-11-26 ENCOUNTER — Encounter: Payer: Medicare (Managed Care) | Admitting: Dermatology

## 2024-12-18 ENCOUNTER — Ambulatory Visit: Payer: Medicare (Managed Care)
